# Patient Record
Sex: Female | Born: 1950 | Race: White | Hispanic: No | Marital: Married | Smoking: Never smoker
Health system: Southern US, Community
[De-identification: ages and names within clinical notes are randomized; demographics above are authoritative.]

## PROBLEM LIST (undated history)

## (undated) DIAGNOSIS — F1910 Other psychoactive substance abuse, uncomplicated: Secondary | ICD-10-CM

## (undated) DIAGNOSIS — M810 Age-related osteoporosis without current pathological fracture: Secondary | ICD-10-CM

## (undated) DIAGNOSIS — K219 Gastro-esophageal reflux disease without esophagitis: Secondary | ICD-10-CM

## (undated) DIAGNOSIS — Z6841 Body Mass Index (BMI) 40.0 and over, adult: Secondary | ICD-10-CM

## (undated) DIAGNOSIS — E785 Hyperlipidemia, unspecified: Secondary | ICD-10-CM

## (undated) DIAGNOSIS — D649 Anemia, unspecified: Secondary | ICD-10-CM

## (undated) DIAGNOSIS — T7840XA Allergy, unspecified, initial encounter: Secondary | ICD-10-CM

## (undated) DIAGNOSIS — M1990 Unspecified osteoarthritis, unspecified site: Secondary | ICD-10-CM

## (undated) DIAGNOSIS — J189 Pneumonia, unspecified organism: Secondary | ICD-10-CM

## (undated) DIAGNOSIS — E119 Type 2 diabetes mellitus without complications: Secondary | ICD-10-CM

## (undated) DIAGNOSIS — I10 Essential (primary) hypertension: Secondary | ICD-10-CM

## (undated) DIAGNOSIS — Z5189 Encounter for other specified aftercare: Secondary | ICD-10-CM

## (undated) DIAGNOSIS — F32A Depression, unspecified: Secondary | ICD-10-CM

## (undated) DIAGNOSIS — F419 Anxiety disorder, unspecified: Secondary | ICD-10-CM

## (undated) HISTORY — DX: Age-related osteoporosis without current pathological fracture: M81.0

## (undated) HISTORY — DX: Gastro-esophageal reflux disease without esophagitis: K21.9

## (undated) HISTORY — DX: Other psychoactive substance abuse, uncomplicated: F19.10

## (undated) HISTORY — DX: Allergy, unspecified, initial encounter: T78.40XA

## (undated) HISTORY — PX: UPPER GASTROINTESTINAL ENDOSCOPY: SHX188

## (undated) HISTORY — PX: COLONOSCOPY: SHX174

## (undated) HISTORY — PX: ABDOMINAL HYSTERECTOMY: SHX81

## (undated) HISTORY — DX: Hyperlipidemia, unspecified: E78.5

## (undated) HISTORY — DX: Encounter for other specified aftercare: Z51.89

## (undated) HISTORY — DX: Essential (primary) hypertension: I10

## (undated) HISTORY — PX: WISDOM TOOTH EXTRACTION: SHX21

## (undated) HISTORY — PX: OOPHORECTOMY: SHX86

## (undated) HISTORY — DX: Type 2 diabetes mellitus without complications: E11.9

## (undated) HISTORY — DX: Anxiety disorder, unspecified: F41.9

## (undated) HISTORY — DX: Depression, unspecified: F32.A

## (undated) HISTORY — PX: CERVIX REMOVAL: SHX592

---

## 2007-05-24 ENCOUNTER — Emergency Department (HOSPITAL_COMMUNITY): Admission: EM | Admit: 2007-05-24 | Discharge: 2007-05-24 | Payer: Self-pay | Admitting: Emergency Medicine

## 2016-01-17 DIAGNOSIS — R911 Solitary pulmonary nodule: Secondary | ICD-10-CM

## 2016-01-17 DIAGNOSIS — R0602 Shortness of breath: Secondary | ICD-10-CM

## 2016-01-17 DIAGNOSIS — R6884 Jaw pain: Secondary | ICD-10-CM | POA: Diagnosis not present

## 2016-01-17 DIAGNOSIS — M542 Cervicalgia: Secondary | ICD-10-CM | POA: Diagnosis not present

## 2016-01-18 DIAGNOSIS — R0602 Shortness of breath: Secondary | ICD-10-CM | POA: Diagnosis not present

## 2016-01-18 DIAGNOSIS — R6884 Jaw pain: Secondary | ICD-10-CM | POA: Diagnosis not present

## 2016-01-18 DIAGNOSIS — M542 Cervicalgia: Secondary | ICD-10-CM | POA: Diagnosis not present

## 2017-10-24 ENCOUNTER — Encounter (HOSPITAL_BASED_OUTPATIENT_CLINIC_OR_DEPARTMENT_OTHER): Payer: Self-pay | Admitting: *Deleted

## 2017-10-24 ENCOUNTER — Other Ambulatory Visit: Payer: Self-pay

## 2017-10-24 ENCOUNTER — Emergency Department (HOSPITAL_BASED_OUTPATIENT_CLINIC_OR_DEPARTMENT_OTHER): Payer: Medicare Other

## 2017-10-24 ENCOUNTER — Observation Stay (HOSPITAL_BASED_OUTPATIENT_CLINIC_OR_DEPARTMENT_OTHER)
Admission: EM | Admit: 2017-10-24 | Discharge: 2017-10-25 | Disposition: A | Discharge status: Home / Self Care | Payer: Medicare Other | Attending: Internal Medicine | Admitting: Internal Medicine

## 2017-10-24 DIAGNOSIS — Z8249 Family history of ischemic heart disease and other diseases of the circulatory system: Secondary | ICD-10-CM | POA: Diagnosis not present

## 2017-10-24 DIAGNOSIS — R0981 Nasal congestion: Secondary | ICD-10-CM | POA: Diagnosis present

## 2017-10-24 DIAGNOSIS — H5712 Ocular pain, left eye: Secondary | ICD-10-CM | POA: Diagnosis not present

## 2017-10-24 DIAGNOSIS — I10 Essential (primary) hypertension: Secondary | ICD-10-CM | POA: Insufficient documentation

## 2017-10-24 DIAGNOSIS — R0600 Dyspnea, unspecified: Secondary | ICD-10-CM | POA: Diagnosis not present

## 2017-10-24 DIAGNOSIS — J3489 Other specified disorders of nose and nasal sinuses: Secondary | ICD-10-CM | POA: Diagnosis not present

## 2017-10-24 DIAGNOSIS — Z6841 Body Mass Index (BMI) 40.0 and over, adult: Secondary | ICD-10-CM | POA: Diagnosis not present

## 2017-10-24 DIAGNOSIS — Z7722 Contact with and (suspected) exposure to environmental tobacco smoke (acute) (chronic): Secondary | ICD-10-CM | POA: Diagnosis not present

## 2017-10-24 DIAGNOSIS — R0789 Other chest pain: Principal | ICD-10-CM | POA: Insufficient documentation

## 2017-10-24 DIAGNOSIS — R079 Chest pain, unspecified: Secondary | ICD-10-CM | POA: Diagnosis present

## 2017-10-24 DIAGNOSIS — E7800 Pure hypercholesterolemia, unspecified: Secondary | ICD-10-CM

## 2017-10-24 HISTORY — DX: Body Mass Index (BMI) 40.0 and over, adult: Z684

## 2017-10-24 HISTORY — DX: Morbid (severe) obesity due to excess calories: E66.01

## 2017-10-24 LAB — BASIC METABOLIC PANEL
ANION GAP: 10 (ref 5–15)
BUN: 19 mg/dL (ref 8–23)
CHLORIDE: 98 mmol/L (ref 98–111)
CO2: 31 mmol/L (ref 22–32)
Calcium: 8.8 mg/dL — ABNORMAL LOW (ref 8.9–10.3)
Creatinine, Ser: 0.62 mg/dL (ref 0.44–1.00)
GFR calc non Af Amer: >60 mL/min (ref >60)
Glucose, Bld: 121 mg/dL — ABNORMAL HIGH (ref 70–99)
POTASSIUM: 4.2 mmol/L (ref 3.5–5.1)
SODIUM: 139 mmol/L (ref 135–145)

## 2017-10-24 LAB — D-DIMER, QUANTITATIVE (NOT AT ARMC): D DIMER QUANT: 0.35 ug{FEU}/mL (ref 0.00–0.50)

## 2017-10-24 LAB — TROPONIN I
Troponin I: <0.03 ng/mL (ref <0.03)
Troponin I: <0.03 ng/mL (ref <0.03)

## 2017-10-24 LAB — CBC
HCT: 45.6 % (ref 36.0–46.0)
HEMOGLOBIN: 14.2 g/dL (ref 12.0–15.0)
MCH: 27 pg (ref 26.0–34.0)
MCHC: 31.1 g/dL (ref 30.0–36.0)
MCV: 86.9 fL (ref 80.0–100.0)
PLATELETS: 283 10*3/uL (ref 150–400)
RBC: 5.25 MIL/uL — AB (ref 3.87–5.11)
RDW: 14.7 % (ref 11.5–15.5)
WBC: 9 10*3/uL (ref 4.0–10.5)
nRBC: 0 % (ref 0.0–0.2)

## 2017-10-24 LAB — BRAIN NATRIURETIC PEPTIDE: B NATRIURETIC PEPTIDE 5: 14.8 pg/mL (ref 0.0–100.0)

## 2017-10-24 MED ORDER — IPRATROPIUM-ALBUTEROL 0.5-2.5 (3) MG/3ML IN SOLN: 3.0000 mL | Freq: Four times a day (QID) | RESPIRATORY_TRACT | Status: DC | PRN

## 2017-10-24 MED ORDER — HYDRALAZINE HCL 20 MG/ML IJ SOLN: 10.0000 mg | INTRAMUSCULAR | Status: DC | PRN

## 2017-10-24 MED ORDER — ACETAMINOPHEN 325 MG PO TABS: 650.0000 mg | ORAL_TABLET | ORAL | Status: DC | PRN

## 2017-10-24 MED ORDER — ENOXAPARIN SODIUM 40 MG/0.4ML ~~LOC~~ SOLN: 40.0000 mg | SUBCUTANEOUS | Status: DC

## 2017-10-24 MED ORDER — ASPIRIN 81 MG PO CHEW
324.0000 mg | CHEWABLE_TABLET | Freq: Once | ORAL | Status: AC
  Administered 2017-10-24: 324 mg via ORAL
  Filled 2017-10-24: qty 4

## 2017-10-24 MED ORDER — ONDANSETRON HCL 4 MG/2ML IJ SOLN: 4.0000 mg | Freq: Four times a day (QID) | INTRAMUSCULAR | Status: DC | PRN

## 2017-10-24 MED ORDER — LORATADINE 10 MG PO TABS
10.0000 mg | ORAL_TABLET | Freq: Every day | ORAL | Status: DC
  Administered 2017-10-25 (×2): 10 mg via ORAL
  Filled 2017-10-24 (×2): qty 1

## 2017-10-24 MED ORDER — GI COCKTAIL ~~LOC~~: 30.0000 mL | Freq: Four times a day (QID) | ORAL | Status: DC | PRN

## 2017-10-24 MED ORDER — MORPHINE SULFATE (PF) 2 MG/ML IV SOLN: 2.0000 mg | INTRAVENOUS | Status: DC | PRN

## 2017-10-24 MED ORDER — GUAIFENESIN ER 600 MG PO TB12
600.0000 mg | ORAL_TABLET | Freq: Two times a day (BID) | ORAL | Status: DC
  Administered 2017-10-25: 600 mg via ORAL
  Filled 2017-10-24: qty 1

## 2017-10-24 NOTE — Progress Notes (Signed)
67 year old female with no significant past medical history being transferred from [facility] to be placed in observation for chest pain, rule out MI.  Work-up negative including a negative EKG, chest x-ray, troponins, CBC and BMP.  The chest pain was associated with shortness of breath and nausea for the last several days.

## 2017-10-24 NOTE — ED Triage Notes (Addendum)
Chest pain x 2 days. Facial pain. SOB on the way here. She drove herself here.

## 2017-10-24 NOTE — ED Notes (Signed)
As EMT was finished with EKG, pateint stated to another nurse. Im feeling better so I hope I can leave soon.

## 2017-10-24 NOTE — ED Notes (Addendum)
C/o chest pressure x 2 days  Worse w palpation  Denies n/v  No radiation  Sob x 1 day  Talking complete sentences  Cough, congestion

## 2017-10-24 NOTE — ED Provider Notes (Signed)
Medical screening examination/treatment/procedure(s) were conducted as a shared visit with non-physician practitioner(s) and myself.  I personally evaluated the patient during the encounter.  EKG Interpretation  Date/Time:  Saturday October 24 2017 13:46:24 EDT Ventricular Rate:  82 PR Interval:    QRS Duration: 81 QT Interval:  354 QTC Calculation: 414 R Axis:   4 Text Interpretation:  Sinus rhythm Low voltage, precordial leads normal. no old comparison Confirmed by Charlesetta Shanks [phone]) on 10/24/2017 5:03:09 PM Has been getting waxing and waning left-sided chest pressure for the past 2 days.  She reports today she had an episode where she quite suddenly felt very poorly with shortness of breath and nausea that came on while driving.  She reports recently her blood pressure has been elevated.  That is been atypical for her in the past.  She was not routinely on blood pressure medications.  Patient is alert and appropriate.  Heart is regular no gross rub murmur gallop.  Lungs are clear no gross wheeze rhonchi or rale.  Symptoms concerning for potential anginal equivalent.  Patient has had aspirin.  She does not have active chest pain at this time.  Plan for admission.   Charlesetta Shanks, MD 10/24/17 1725

## 2017-10-24 NOTE — ED Notes (Signed)
ED Provider at bedside. 

## 2017-10-24 NOTE — ED Notes (Signed)
EMT will attempt EKG once PA is done with assessment

## 2017-10-24 NOTE — H&P (Addendum)
History and Physical    Amber Huffman LXB:[id number] DOB: 1950/04/26 DOA: 10/24/2017  Referring MD/NP/PA: Merton Border, MD PCP: System, Pcp Not In  Patient coming from: [facility] transfer  Chief Complaint: Chest pressure  I have personally briefly reviewed patient's old medical records in [place]   HPI: Amber Huffman is a 66 y.o. female with medical history significant of morbid obesity; who presents with complaints of chest pressure and shortness of breath over the last 2-3 days.  She was evaluated by her primary care provider 4 days ago and noted to have gained 8 pounds since June and was found to have elevated blood pressure of 167/90.  Her blood pressure is normally very well controlled.  She reports having intermittent bouts of left-sided chest pressure that do not radiate.  2 days ago when symptoms occurred she took aspirin with resolution of symptoms.  Notes associated shortness of breath and some mild swelling of her shins.  Symptoms occurred usually at rest.  Denies having any diaphoresis, lightheadedness, nausea, vomiting, palpitations, recent travel, calf pain, history of blood clots, fever, chills, or chest pain symptoms previously.  Over the last day or so she has had left-sided sinus congestion, pressure, postnasal drainage, and nonproductive cough.  She took an old Lasix pill that she had because she felt that her blood pressure was elevated.  Yesterday when patient had chest pressure symptoms she took ibuprofen with improvement in symptoms.  However, when symptoms occurred again today she went to the emergency department for further evaluation.  The patient denies smoking but reports secondhand exposure from her sister who smokes.  ED Course: Upon admission into the emergency department patient was seen to be afebrile, pulse 75-111, respirations 13-24, blood pressures elevated up to 175/100, and O2 saturation maintained on room air.  With ambulation O2 sats dropped once to 84%, but  came back to 96% with deep breaths.  Work-up so far negative including  EKG, chest x-ray, troponins, CBC, and BMP.  Was given 324 mg of aspirin to chew.  TRH called to admit and accepted to a telemetry bed for observation overnight.  Review of Systems  Constitutional: Positive for malaise/fatigue. Negative for fever.  HENT: Positive for congestion.   Eyes: Negative for photophobia and pain.  Respiratory: Positive for cough and shortness of breath. Negative for sputum production and wheezing.   Cardiovascular: Positive for chest pain and leg swelling.  Gastrointestinal: Negative for abdominal pain, nausea and vomiting.  Genitourinary: Negative for dysuria and frequency.  Musculoskeletal: Positive for joint pain. Negative for falls.  Neurological: Positive for headaches. Negative for loss of consciousness.  Endo/Heme/Allergies: Positive for environmental allergies. Does not bruise/bleed easily.  Psychiatric/Behavioral: Negative for memory loss and substance abuse.    Past Medical History:  Diagnosis Date  . Morbid obesity with BMI of 50.0-59.9, adult [facility])     Past Surgical History:  Procedure Laterality Date  . ABDOMINAL HYSTERECTOMY       reports that she is a non-smoker but has been exposed to tobacco smoke. She has never used smokeless tobacco. She reports that she drank alcohol. She reports that she does not use drugs.  Allergies  Allergen Reactions  . Penicillins     rash    History reviewed. No pertinent family history.  Prior to Admission medications   Not on File    Physical Exam:  Constitutional: Obese female in NAD, calm, comfortable Vitals:   10/24/17 1814 10/24/17 1831 10/24/17 1833 10/24/17 1955  BP:  Marland Kitchen)  158/66  (!) 152/68  Pulse: 78  86   Resp: 13  17 15   Temp:    98.6 F (37 C)  TempSrc:    Oral  SpO2: 95%  97% 98%  Weight:      Height:       Eyes: PERRL, lids and conjunctivae normal ENMT: Mucous membranes are moist. Posterior pharynx with mucous  drainage present. Neck: normal, supple, no masses, no thyromegaly Respiratory: clear to auscultation bilaterally, no wheezing, no crackles. Normal respiratory effort. No accessory muscle use.  Cardiovascular: Regular rate and rhythm, no murmurs / rubs / gallops.  Trace lower extremity  edema. 2+ pedal pulses. No carotid bruits.  Some mild point tenderness to palpation of the left substernal chest wall, but reports not similar Abdomen: no tenderness, no masses palpated. No hepatosplenomegaly. Bowel sounds positive.  Musculoskeletal: no clubbing / cyanosis. No joint deformity upper and lower extremities. Good ROM, no contractures. Normal muscle tone.  Skin: no rashes, lesions, ulcers. No induration Neurologic: CN 2-12 grossly intact. Sensation intact, DTR normal. Strength 5/5 in all 4.  Psychiatric: Normal judgment and insight. Alert and oriented x 3. Normal mood.     Labs on Admission: I have personally reviewed following labs and imaging studies  CBC: Recent Labs  Lab 10/24/17 1354  WBC 9.0  HGB 14.2  HCT 45.6  MCV 86.9  PLT 027   Basic Metabolic Panel: Recent Labs  Lab 10/24/17 1354  NA 139  K 4.2  CL 98  CO2 31  GLUCOSE 121*  BUN 19  CREATININE 0.62  CALCIUM 8.8*   GFR: Estimated Creatinine Clearance: 74.4 mL/min (by C-G formula based on SCr of 0.62 mg/dL). Liver Function Tests: No results for input(s): AST, ALT, ALKPHOS, BILITOT, PROT, ALBUMIN in the last 168 hours. No results for input(s): LIPASE, AMYLASE in the last 168 hours. No results for input(s): AMMONIA in the last 168 hours. Coagulation Profile: No results for input(s): INR, PROTIME in the last 168 hours. Cardiac Enzymes: Recent Labs  Lab 10/24/17 1354 10/24/17 1702  TROPONINI <0.03 <0.03   BNP (last 3 results) No results for input(s): PROBNP in the last 8760 hours. HbA1C: No results for input(s): HGBA1C in the last 72 hours. CBG: No results for input(s): GLUCAP in the last 168 hours. Lipid  Profile: No results for input(s): CHOL, HDL, LDLCALC, TRIG, CHOLHDL, LDLDIRECT in the last 72 hours. Thyroid Function Tests: No results for input(s): TSH, T4TOTAL, FREET4, T3FREE, THYROIDAB in the last 72 hours. Anemia Panel: No results for input(s): VITAMINB12, FOLATE, FERRITIN, TIBC, IRON, RETICCTPCT in the last 72 hours. Urine analysis: No results found for: COLORURINE, APPEARANCEUR, LABSPEC, PHURINE, GLUCOSEU, HGBUR, BILIRUBINUR, KETONESUR, PROTEINUR, UROBILINOGEN, NITRITE, LEUKOCYTESUR Sepsis Labs: No results found for this or any previous visit (from the past 240 hour(s)).   Radiological Exams on Admission: Dg Chest 2 View  Result Date: 10/24/2017 CLINICAL DATA:  Chest pain for the past 2 days. EXAM: CHEST - 2 VIEW COMPARISON:  Chest x-ray dated February 26, 2016. FINDINGS: The heart size and mediastinal contours are within normal limits. Normal pulmonary vascularity. Scattered pleuroparenchymal scarring is unchanged. No focal consolidation, pleural effusion, or pneumothorax. Unchanged eventration of the right hemidiaphragm. No acute osseous abnormality. IMPRESSION: No active cardiopulmonary disease. Electronically Signed   By: Titus Dubin M.D.   On: 10/24/2017 13:43    EKG: Independently reviewed.  Normal sinus rhythm at 82 bpm  Assessment/Plan Chest pain: Acute.  Patient presents with complaints of left-sided chest pressure that  have occurred intermittently and not associated with exertion.  EKG without any significant ischemic changes.  Initial troponins negative. Heart score is at least 4.  Risk factors include obesity, age, HTN, passive sg - Admit to a telemetry bed - Check d-dimer - Trend cardiac troponins overnight - Check lipid panel in a.m. - Order echocardiogram in a.m, if warranted - Will need to consult cardiology in a.m.   Dyspnea: Acute.  Patient reports having associated complaints of shortness of breath.  Chest x-ray otherwise noted to be clear. - D-dimer and  obtain CT angiogram of chest if found positive  Sinus congestion: Patient does appear to have sinus congestion with signs of postnasal drip on physical exam. - Symptomatic treatment with Claritin and Mucinex  Elevated blood pressure: Blood pressures noted to be as high as 178/100. -Will need to be follow-up with PCP and determine if patient needs to be started on blood pressure medication  Passive exposure to smoke: Patient lives with her sister who smokes.  Morbid obesity: BMI 51.77: Patient reports weight gain of 8 pounds over the last 3 months. - Check TSH  History of lung nodule: Patient had CT imaging in 09/30/2016 per review of records on care everywhere showing small sub-solid nodules previously seen had essentially resolved.  DVT prophylaxis: lovenox  Code Status: Full  Family Communication: no family present  Disposition Plan: Likely discharge home if work-up negative Consults called: None  Admission status: observation  Norval Morton MD [facility] Pager [phone]   If 7PM-7AM, please contact night-coverage [email or web address] Password [facility]  10/24/2017, 8:46 PM

## 2017-10-24 NOTE — ED Notes (Signed)
Ambulated to restroom with pulse ox. SAT dropped to 84% once, and came right back up to 96% with some good deep breaths. Back on pulse ox in room = 98% on RA

## 2017-10-24 NOTE — ED Provider Notes (Signed)
[place] EMERGENCY DEPARTMENT Provider Note   CSN: [id number] Arrival date & time: 10/24/17  1301     History   Chief Complaint Chief Complaint  Patient presents with  . Chest Pain    HPI Amber Huffman is a 67 y.o. female.  HPI   Patient is a 67 year old female who presents the emergency department complaining of chest pain and shortness of breath.  Patient states that she has had left-sided chest pain for the last 3 days.  Describes the pain as a pressure that she rates at 4-5/10.  Pain is nonradiating.  It is worse with palpation.  Pain is constant.  It is not associated with exertion, diaphoresis, lightheadedness or dizziness.  Pain is associated with shortness of breath that she noted today.  States she has chronic DOE for the last several months but today had SOB and nausea while driving in the car. Reports sxs improving after arriving to the ED. No pain with inspiration.  Also notes a nonproductive cough and nasal congestion for the last 2 days.  Also reports pressure to the left side of her head beginning she describes as mild.  She took aspirin and ibuprofen yesterday which relieved her symptoms.  No numbness or weakness to the arms or legs. No vision changes, lightheadedness, dizziness, or eye pain.  Was seen by pcp earlier this week for her physical and was noted to have increased BP. She was not started on medications. No tobacco use.  Denies leg pain/swelling, hemoptysis, recent surgery/trauma, recent long travel, hormone use, personal hx of cancer, or hx of DVT/PE.    History reviewed. No pertinent past medical history.  Patient Active Problem List   Diagnosis Date Noted  . Chest pain 10/24/2017    Past Surgical History:  Procedure Laterality Date  . ABDOMINAL HYSTERECTOMY       OB History   None      Home Medications    Prior to Admission medications   Not on File    Family History No family history on file.  Social History Social  History   Tobacco Use  . Smoking status: Passive Smoke Exposure - Never Smoker  . Smokeless tobacco: Never Used  Substance Use Topics  . Alcohol use: Not on file  . Drug use: Never     Allergies   Penicillins   Review of Systems Review of Systems  Constitutional: Negative for chills and fever.  HENT: Positive for congestion and rhinorrhea. Negative for sore throat.   Eyes: Negative for visual disturbance.  Respiratory: Positive for cough and shortness of breath (improving).   Cardiovascular: Positive for chest pain. Negative for leg swelling.  Gastrointestinal: Positive for nausea. Negative for abdominal pain, constipation, diarrhea and vomiting.  Genitourinary: Negative for dysuria.  Musculoskeletal: Negative for back pain.  Skin: Negative for rash.  Neurological: Negative for headaches.    Physical Exam Updated Vital Signs BP (!) 128/56   Pulse 80   Temp 98.6 F (37 C) (Oral)   Resp 13   Ht 4' 10.5" (1.486 m)   Wt 114.3 kg   SpO2 97%   BMI 51.77 kg/m   Physical Exam  Constitutional: She appears well-developed and well-nourished. She does not appear ill. No distress.  HENT:  Head: Normocephalic and atraumatic.  Eyes: Conjunctivae are normal.  Neck: Neck supple.  Cardiovascular: Normal rate and regular rhythm.  No murmur heard. Left chest wall TTP, reproduces pain. No rashes to suggest shingles.  Pulmonary/Chest: Effort normal and  breath sounds normal. No respiratory distress. She has no decreased breath sounds. She has no wheezes. She has no rhonchi. She has no rales.  Dry cough present  Abdominal: Soft. Bowel sounds are normal. She exhibits no distension. There is no tenderness.  Musculoskeletal: She exhibits no edema.  Trace pitting edema. No calf TTP or erythema  Neurological: She is alert.  Skin: Skin is warm and dry.  Psychiatric: She has a normal mood and affect.  Nursing note and vitals reviewed.   ED Treatments / Results  Labs (all labs ordered  are listed, but only abnormal results are displayed) Labs Reviewed  BASIC METABOLIC PANEL - Abnormal; Notable for the following components:      Result Value   Glucose, Bld 121 (*)    Calcium 8.8 (*)    All other components within normal limits  CBC - Abnormal; Notable for the following components:   RBC 5.25 (*)    All other components within normal limits  TROPONIN I  BRAIN NATRIURETIC PEPTIDE  TROPONIN I    EKG EKG Interpretation  Date/Time:  Saturday October 24 2017 13:46:24 EDT Ventricular Rate:  82 PR Interval:    QRS Duration: 81 QT Interval:  354 QTC Calculation: 414 R Axis:   4 Text Interpretation:  Sinus rhythm Low voltage, precordial leads normal. no old comparison Confirmed by Charlesetta Shanks [phone]) on 10/24/2017 5:03:09 PM   Radiology Dg Chest 2 View  Result Date: 10/24/2017 CLINICAL DATA:  Chest pain for the past 2 days. EXAM: CHEST - 2 VIEW COMPARISON:  Chest x-ray dated February 26, 2016. FINDINGS: The heart size and mediastinal contours are within normal limits. Normal pulmonary vascularity. Scattered pleuroparenchymal scarring is unchanged. No focal consolidation, pleural effusion, or pneumothorax. Unchanged eventration of the right hemidiaphragm. No acute osseous abnormality. IMPRESSION: No active cardiopulmonary disease. Electronically Signed   By: Titus Dubin M.D.   On: 10/24/2017 13:43    Procedures Procedures (including critical care time)  Medications Ordered in ED Medications  aspirin chewable tablet 324 mg (324 mg Oral Given 10/24/17 1452)     Initial Impression / Assessment and Plan / ED Course  I have reviewed the triage vital signs and the nursing notes.  Pertinent labs & imaging results that were available during my care of the patient were reviewed by me and considered in my medical decision making (see chart for details).   Nursing staff ambulated patient and noted that sats dropped to 84% on monitor with ambulation but immediately  went back up above 96% on room air.  Pulse oximetry that was used during this ambulation did not show waveform.  Re-evaluated pt. I personally ambulated her with pulse oximetry monitoring and patient maintain sats from 96 to 98% on room air with good waveform. She did become tachycardic with ambulation. Pt states her CP has resolved and she is no longer short of breath. She is eager to be discharged but is agreeable to stay for delta troponin.  Discussed pt presentation and exam findings with Dr. Johnney Killian, who evaluated the pt and recommends admitting the patient.  5:58 PM CONSULT with Dr. Samara Deist, hospitalist service, who accepts the pt for admission at [facility].   Final Clinical Impressions(s) / ED Diagnoses   Final diagnoses:  Chest pain, unspecified type   Pt presenting to to the ED today c/o left sided chest pain and shortness of breath. Has chronic dyspnea on exertion that she feels is worse today and also associated with nausea.  Initially HR documented at 111, has improved and remained normal during her stay. Initial BP was also elevated, but normalized throughout her stay without intervention. She was given 325 mg ASA. EKG with NSR and no ischemic changes. Delta trop negative. BNP negative. CBC without anemia or leukocytosis. BMP reassuring. CXR without pneumonia, pneumothorax, or or widened mediastinum.  Heart score 4, and pt high risk for ACS. Have lower suspicion for PE or other cardiopulmonary etiology of sxs. Will plan for admission for chest pain workup.  Pt admitted to [facility].  ED Discharge Orders    None       Rodney Booze, [place] 10/24/17 Johnnye Lana    Charlesetta Shanks, MD 10/24/17 1843

## 2017-10-25 DIAGNOSIS — R079 Chest pain, unspecified: Secondary | ICD-10-CM | POA: Diagnosis not present

## 2017-10-25 DIAGNOSIS — R0789 Other chest pain: Secondary | ICD-10-CM | POA: Diagnosis not present

## 2017-10-25 DIAGNOSIS — I10 Essential (primary) hypertension: Secondary | ICD-10-CM

## 2017-10-25 DIAGNOSIS — E7800 Pure hypercholesterolemia, unspecified: Secondary | ICD-10-CM | POA: Diagnosis not present

## 2017-10-25 LAB — TROPONIN I: Troponin I: <0.03 ng/mL (ref <0.03)

## 2017-10-25 LAB — CBC WITH DIFFERENTIAL/PLATELET
ABS IMMATURE GRANULOCYTES: 0.03 10*3/uL (ref 0.00–0.07)
BASOS ABS: 0 10*3/uL (ref 0.0–0.1)
Basophils Relative: 1 %
Eosinophils Absolute: 0.6 10*3/uL — ABNORMAL HIGH (ref 0.0–0.5)
Eosinophils Relative: 7 %
HCT: 48.4 % — ABNORMAL HIGH (ref 36.0–46.0)
HEMOGLOBIN: 14.4 g/dL (ref 12.0–15.0)
Immature Granulocytes: 0 %
Lymphocytes Relative: 26 %
Lymphs Abs: 2.2 10*3/uL (ref 0.7–4.0)
MCH: 25.7 pg — ABNORMAL LOW (ref 26.0–34.0)
MCHC: 29.8 g/dL — ABNORMAL LOW (ref 30.0–36.0)
MCV: 86.3 fL (ref 80.0–100.0)
MONO ABS: 0.9 10*3/uL (ref 0.1–1.0)
MONOS PCT: 10 %
NEUTROS ABS: 4.9 10*3/uL (ref 1.7–7.7)
Neutrophils Relative %: 56 %
Platelets: 319 10*3/uL (ref 150–400)
RBC: 5.61 MIL/uL — ABNORMAL HIGH (ref 3.87–5.11)
RDW: 14.8 % (ref 11.5–15.5)
WBC: 8.6 10*3/uL (ref 4.0–10.5)
nRBC: 0 % (ref 0.0–0.2)

## 2017-10-25 LAB — BASIC METABOLIC PANEL
Anion gap: 10 (ref 5–15)
BUN: 11 mg/dL (ref 8–23)
CHLORIDE: 101 mmol/L (ref 98–111)
CO2: 28 mmol/L (ref 22–32)
Calcium: 8.7 mg/dL — ABNORMAL LOW (ref 8.9–10.3)
Creatinine, Ser: 0.66 mg/dL (ref 0.44–1.00)
GFR calc non Af Amer: >60 mL/min (ref >60)
Glucose, Bld: 114 mg/dL — ABNORMAL HIGH (ref 70–99)
POTASSIUM: 4.1 mmol/L (ref 3.5–5.1)
SODIUM: 139 mmol/L (ref 135–145)

## 2017-10-25 LAB — GLUCOSE, CAPILLARY: GLUCOSE-CAPILLARY: 137 mg/dL — AB (ref 70–99)

## 2017-10-25 LAB — TSH: TSH: 1.772 u[IU]/mL (ref 0.350–4.500)

## 2017-10-25 LAB — LIPID PANEL
CHOL/HDL RATIO: 5.1 ratio
Cholesterol: 204 mg/dL — ABNORMAL HIGH (ref 0–200)
HDL: 40 mg/dL — AB (ref >40)
LDL CALC: 142 mg/dL — AB (ref 0–99)
Triglycerides: 109 mg/dL (ref <150)
VLDL: 22 mg/dL (ref 0–40)

## 2017-10-25 MED ORDER — HYDROCHLOROTHIAZIDE 12.5 MG PO CAPS: 12.5000 mg | ORAL_CAPSULE | Freq: Every day | ORAL | 1 refills | Status: DC

## 2017-10-25 MED ORDER — HYDROCHLOROTHIAZIDE 12.5 MG PO CAPS
12.5000 mg | ORAL_CAPSULE | Freq: Every day | ORAL | Status: DC
  Administered 2017-10-25: 12.5 mg via ORAL
  Filled 2017-10-25: qty 1

## 2017-10-25 MED ORDER — SALINE SPRAY 0.65 % NA SOLN
1.0000 | NASAL | Status: DC | PRN
  Administered 2017-10-25: 1 via NASAL
  Filled 2017-10-25: qty 44

## 2017-10-25 NOTE — Consult Note (Signed)
Cardiology Consultation:   Patient ID: Amber Huffman MRN: [id number]; DOB: Apr 26, 1950  Admit date: 10/24/2017 Date of Consult: 10/25/2017  Primary Care Provider: System, Pcp Not In Primary Cardiologist: New to [facility]   Patient Profile:   Amber Huffman is a 67 y.o. female with a hx of morbid obesity and lung nodules (seen at [facility] and now discharge) who is being seen today for the evaluation of chest pressure at the request of Dr. Eliseo Squires.  No prior history of CAD or any cardiac issue.  History of Present Illness:   Amber Huffman resented with intermittent history of chest pressure and shortness of breath for the past 3 days.  Patient retired earlier this year and since then living sedentary lifestyle.  Watching TV and eating double portion meal.  Seen by PCP about a week ago and noted 8 pound weight gain and hypertensive at 167/90. Felt most likely due to excessive eating.  She had an episode of chest pressure Friday night while watching TV.  Resolved with chewing aspirin.  Yesterday she had shortness of breath with chest pressure while driving leading to presentation at [facility] She was noted hypertensive at 175/100.  Patient reports chronic dyspnea on exertion due to morbid obesity and sedentary lifestyle.  No prior episode of chest pressure.  She has intermittent orthopnea and lower extremity edema.  For the past few days she also has cough, congestion, sinus pressure especially behind left eye.  She denies palpitation, dizziness or syncope.  Patient had left eye pain overnight which improved with Claritin.  No recurrent chest pain since admit.  Admitted and ruled out.  Troponin negative x5.  BNP normal.  Electrolyte, serum creatinine and hemoglobin within normal range.  LDL 142.  D-dimer normal.  TSH normal.  Chest x-ray without acute abnormality.  EKG shows sinus rhythm at rate of 82 bpm, no acute ischemic changes-personally reviewed.  Patient denies use of illicit drug or  tobacco abuse.  Father and mother had CAD in their 11s.  Thinks her blood pressure cause her to have these symptoms.  Past Medical History:  Diagnosis Date  . Morbid obesity with BMI of 50.0-59.9, adult [facility])     Past Surgical History:  Procedure Laterality Date  . ABDOMINAL HYSTERECTOMY       Inpatient Medications: Scheduled Meds: . enoxaparin (LOVENOX) injection  40 mg Subcutaneous Q24H  . guaiFENesin  600 mg Oral BID  . loratadine  10 mg Oral Daily   Continuous Infusions:  PRN Meds: acetaminophen, gi cocktail, hydrALAZINE, ipratropium-albuterol, morphine injection, ondansetron (ZOFRAN) IV  Allergies:    Allergies  Allergen Reactions  . Penicillins Rash    Has patient had a PCN reaction causing immediate rash, facial/tongue/throat swelling, SOB or lightheadedness with hypotension: Yes Has patient had a PCN reaction causing severe rash involving mucus membranes or skin necrosis: No Has patient had a PCN reaction that required hospitalization: No Has patient had a PCN reaction occurring within the last 10 years: No If all of the above answers are "NO", then may proceed with Cephalosporin use.    Social History:   Social History   Socioeconomic History  . Marital status: Married    Spouse name: Not on file  . Number of children: Not on file  . Years of education: Not on file  . Highest education level: Not on file  Occupational History  . Not on file  Social Needs  . Financial resource strain: Not hard at all  . Food  insecurity:    Worry: Never true    Inability: Never true  . Transportation needs:    Medical: No    Non-medical: No  Tobacco Use  . Smoking status: Passive Smoke Exposure - Never Smoker  . Smokeless tobacco: Never Used  Substance and Sexual Activity  . 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  . Drug use: Never  . Sexual  activity: Not on file  Lifestyle  . Physical activity:    Days per week: 0 days    Minutes per session: 0 min  . Stress: Not at all  Relationships  . Social connections:    Talks on phone: More than three times a week    Gets together: More than three times a week    Attends religious service: 1 to 4 times per year    Active member of club or organization: No    Attends meetings of clubs or organizations: Never    Relationship status: Married  . Intimate partner violence:    Fear of current or ex partner: No    Emotionally abused: No    Physically abused: No    Forced sexual activity: No  Other Topics Concern  . Not on file  Social History Narrative  . Not on file    Family History:   Family History  Problem Relation Age of Onset  . Heart disease Father        After the age of 66     ROS:  Please see the history of present illness.  All other ROS reviewed and negative.     Physical Exam/Data:   Vitals:   10/24/17 1831 10/24/17 1833 10/24/17 1955 10/25/17 0611  BP: (!) 158/66  (!) 152/68 (!) 153/78  Pulse:  86  80  Resp:  17 15 15   Temp:   98.6 F (37 C)   TempSrc:   Oral   SpO2:  97% 98% 98%  Weight:      Height:       No intake or output data in the 24 hours ending 10/25/17 0740 Filed Weights   10/24/17 1307  Weight: 114.3 kg   Body mass index is 51.77 kg/m.  General: Morbidly obese female in no acute distress HEENT: normal Lymph: no adenopathy Neck: no JVD Endocrine:  No thryomegaly Vascular: No carotid bruits; FA pulses 2+ bilaterally without bruits  Cardiac:  normal S1, S2; RRR; no murmur  Lungs:  clear to auscultation bilaterally, no wheezing, rhonchi or rales  Abd: soft, nontender, no hepatomegaly  Ext: Trace ankle edema Musculoskeletal:  No deformities, BUE and BLE strength normal and equal Skin: warm and dry  Neuro:  CNs 2-12 intact, no focal abnormalities noted Psych:  Normal affect   Telemetry:  Telemetry was personally reviewed and  demonstrates: Sinus rhythm at controlled ventricular rate  Relevant CV Studies: As summarized above  Laboratory Data:  Chemistry Recent Labs  Lab 10/24/17 1354 10/25/17 0308  NA 139 139  K 4.2 4.1  CL 98 101  CO2 31 28  GLUCOSE 121* 114*  BUN 19 11  CREATININE 0.62 0.66  CALCIUM 8.8* 8.7*  GFRNONAA >60 >60  GFRAA >60 >60  ANIONGAP 10 10    No results for input(s): PROT, ALBUMIN, AST, ALT, ALKPHOS, BILITOT in the last 168 hours. Hematology Recent Labs  Lab 10/24/17 1354 10/25/17 0308  WBC 9.0 8.6  RBC 5.25* 5.61*  HGB 14.2 14.4  HCT 45.6 48.4*  MCV 86.9 86.3  MCH 27.0 25.7*  MCHC 31.1 29.8*  RDW 14.7 14.8  PLT 283 319   Cardiac Enzymes Recent Labs  Lab 10/24/17 1354 10/24/17 1702 10/24/17 2118 10/25/17 0027 10/25/17 0308  TROPONINI <0.03 <0.03 <0.03 <0.03 <0.03   No results for input(s): TROPIPOC in the last 168 hours.  BNP Recent Labs  Lab 10/24/17 1354  BNP 14.8    DDimer  Recent Labs  Lab 10/24/17 2118  DDIMER 0.35    Radiology/Studies:  Dg Chest 2 View  Result Date: 10/24/2017 CLINICAL DATA:  Chest pain for the past 2 days. EXAM: CHEST - 2 VIEW COMPARISON:  Chest x-ray dated February 26, 2016. FINDINGS: The heart size and mediastinal contours are within normal limits. Normal pulmonary vascularity. Scattered pleuroparenchymal scarring is unchanged. No focal consolidation, pleural effusion, or pneumothorax. Unchanged eventration of the right hemidiaphragm. No acute osseous abnormality. IMPRESSION: No active cardiopulmonary disease. Electronically Signed   By: Titus Dubin M.D.   On: 10/24/2017 13:43    Assessment and Plan:   1. Chest pressure Patient ruled out.  Troponin negative x5.  EKG without acute ischemic changes.  BNP and d-dimer within normal range.  Differential includes untreated hypertension and possible bronchitis/sinus pressure.  Her main concern is pain behind left  eye.  She might have a component of diastolic dysfunction  given obesity. -Consider low-dose hydrochlorothiazide. Likely outpatient 2 days stress test. Will review final plan with MD.   2.  Left eye pain -Per primary team  3.  Intermittent elevated blood pressure -No history of hypertension.  4.  Morbid obesity -She has joined [employer] and plan to start next week.   For questions or updates, please contact [place] Please consult [email or web address] for contact info under     Jarrett Soho, PA  10/25/2017 7:40 AM

## 2017-10-25 NOTE — Discharge Summary (Addendum)
Physician Discharge Summary  Amber Huffman DGU:[id number] DOB: 1950-04-13 DOA: 10/24/2017  PCP: System, Pcp Not In  Admit date: 10/24/2017 Discharge date: 10/25/2017  Time spent: 45 minutes  Recommendations for Outpatient Follow-up:  1. Follow up with PCP next week as scheduled. Evaluate BP control. Recommend labs to trend potassium level and HCTZ started.  2. If patient continues to have chest pain once BP controlled consider OP stress test. 3. Needs weight loss/exercise   Discharge Diagnoses:  Principal Problem:   Chest pain Active Problems:   Morbid obesity ([place])   Passive smoke exposure   Sinus congestion   Essential hypertension   Pure hypercholesterolemia   Discharge Condition: stable and pain free  Diet recommendation: heart healthy  Filed Weights   10/24/17 1307  Weight: 114.3 kg    History of present illness:  Amber Huffman is a 67 y.o. female with medical history significant of morbid obesity; who presented on 10/12 with complaints of chest pressure and shortness of breath for 2-3 days.  She was evaluated by her primary care provider 4 days porior and noted to have gained 8 pounds since June and was found to have elevated blood pressure of 167/90.  She reported having intermittent bouts of left-sided chest pressure that do not radiate.  2 days prior when symptoms occurred she took aspirin with resolution of symptoms.  Noted associated shortness of breath and some mild swelling of her shins.  Symptoms occurred usually at rest.  Denied having any diaphoresis, lightheadedness, nausea, vomiting, palpitations, recent travel, calf pain, history of blood clots, fever, chills, or chest pain symptoms previously.  Over the previous day she  had left-sided sinus congestion, pressure, postnasal drainage, and nonproductive cough.  She took an old Lasix pill that she had because she felt that her blood pressure was elevated.  Day prior to admission when patient had chest pressure  symptoms she took ibuprofen with improvement in symptoms.  However, when symptoms recurred she went to the emergency department for further evaluation.  The patient denied smoking but reported secondhand exposure from her sister who smokes.  Hospital Course:  Chest pain/pressure: Acute.  Patient presents with complaints of left-sided chest pressure that have occurred intermittently and not associated with exertion.  EKG without any significant ischemic changes. Troponin negative x3. ekg unremarkable and chest xray without cardiopulmonary process. BNP within limits of normal.  Heart score is 4.  Risk factors include obesity, age, HTN, passive sg. Evaluated by cardiology who opine not likely ACS but likely related to HTN. Recommended HCTZ daily. Cardiology also recommended OP stress test if symptoms continue after BP controlled   Dyspnea: Resolved at discharge. Chest x-ray otherwise noted to be clear  Sinus congestion:  - Symptomatic treatment with Claritin and Mucinex and nasal spray -if does not clear- further work up  Elevated blood pressure: Blood pressures noted to be as high as 178/100. See #1. Follow up with PCP 1 week as scheduled  Passive exposure to smoke: Patient lives with her sister who smokes.  Morbid obesity: BMI 51.77: Patient reports weight gain of 8 pounds over the last 3 months. TSHwithin limits of normal   Procedures:    Consultations:  Dr Oval Linsey cardiology  Discharge Exam: Vitals:   10/24/17 1955 10/25/17 0611  BP: (!) 152/68 (!) 153/78  Pulse:  80  Resp: 15 15  Temp: 98.6 F (37 C)   SpO2: 98% 98%    General: well nourished sitting up in bed in no acute distress Cardiovascular: rrr  no mgr no LE edema Respiratory: normal effort BS clear bilaterally no wheeze  Discharge Instructions   Discharge Instructions    Call MD for:  difficulty breathing, headache or visual disturbances   Complete by:  As directed    Call MD for:  persistant dizziness  or light-headedness   Complete by:  As directed    Call MD for:  persistant nausea and vomiting   Complete by:  As directed    Diet - low sodium heart healthy   Complete by:  As directed    Discharge instructions   Complete by:  As directed    Follow up with PCP as scheduled next week. Evaluate BP control. Recommend lab work to trend potasium level.   Increase activity slowly   Complete by:  As directed      Allergies as of 10/25/2017      Reactions   Penicillins Rash   Has patient had a PCN reaction causing immediate rash, facial/tongue/throat swelling, SOB or lightheadedness with hypotension: Yes Has patient had a PCN reaction causing severe rash involving mucus membranes or skin necrosis: No Has patient had a PCN reaction that required hospitalization: No Has patient had a PCN reaction occurring within the last 10 years: No If all of the above answers are "NO", then may proceed with Cephalosporin use.      Medication List    STOP taking these medications   furosemide 20 MG tablet Commonly known as:  LASIX     TAKE these medications   hydrochlorothiazide 12.5 MG capsule Commonly known as:  MICROZIDE Take 1 capsule (12.5 mg total) by mouth daily.   ibuprofen 800 MG tablet Commonly known as:  ADVIL,MOTRIN Take 800 mg by mouth daily as needed (pain).   OVER THE COUNTER MEDICATION Apply 1 application topically daily as needed (rash). OTC anti-fungal medication - cream   Vitamin D (Ergocalciferol) 50000 units Caps capsule Commonly known as:  DRISDOL Take 50,000 Units by mouth every Thursday.      Allergies  Allergen Reactions  . Penicillins Rash    Has patient had a PCN reaction causing immediate rash, facial/tongue/throat swelling, SOB or lightheadedness with hypotension: Yes Has patient had a PCN reaction causing severe rash involving mucus membranes or skin necrosis: No Has patient had a PCN reaction that required hospitalization: No Has patient had a PCN  reaction occurring within the last 10 years: No If all of the above answers are "NO", then may proceed with Cephalosporin use.   Follow-up Information    Lendon Colonel, NP. Go on 11/03/2017.   Specialties:  Nurse Practitioner, Radiology, Cardiology Why:  @8 :30am for hospital cardiology follow up  Contact information: [address] [place] [address] [phone]        follow up with PCP Follow up.   Why:  if conservative measures do not help your sinus pain, may need further work up           The results of significant diagnostics from this hospitalization (including imaging, microbiology, ancillary and laboratory) are listed below for reference.    Significant Diagnostic Studies: Dg Chest 2 View  Result Date: 10/24/2017 CLINICAL DATA:  Chest pain for the past 2 days. EXAM: CHEST - 2 VIEW COMPARISON:  Chest x-ray dated February 26, 2016. FINDINGS: The heart size and mediastinal contours are within normal limits. Normal pulmonary vascularity. Scattered pleuroparenchymal scarring is unchanged. No focal consolidation, pleural effusion, or pneumothorax. Unchanged eventration of the right hemidiaphragm. No  acute osseous abnormality. IMPRESSION: No active cardiopulmonary disease. Electronically Signed   By: Titus Dubin M.D.   On: 10/24/2017 13:43    Microbiology: No results found for this or any previous visit (from the past 240 hour(s)).   Labs: Basic Metabolic Panel: Recent Labs  Lab 10/24/17 1354 10/25/17 0308  NA 139 139  K 4.2 4.1  CL 98 101  CO2 31 28  GLUCOSE 121* 114*  BUN 19 11  CREATININE 0.62 0.66  CALCIUM 8.8* 8.7*   Liver Function Tests: No results for input(s): AST, ALT, ALKPHOS, BILITOT, PROT, ALBUMIN in the last 168 hours. No results for input(s): LIPASE, AMYLASE in the last 168 hours. No results for input(s): AMMONIA in the last 168 hours. CBC: Recent Labs  Lab 10/24/17 1354 10/25/17 0308  WBC 9.0 8.6  NEUTROABS  --   4.9  HGB 14.2 14.4  HCT 45.6 48.4*  MCV 86.9 86.3  PLT 283 319   Cardiac Enzymes: Recent Labs  Lab 10/24/17 1354 10/24/17 1702 10/24/17 2118 10/25/17 0027 10/25/17 0308  TROPONINI <0.03 <0.03 <0.03 <0.03 <0.03   BNP: BNP (last 3 results) Recent Labs    10/24/17 1354  BNP 14.8    ProBNP (last 3 results) No results for input(s): PROBNP in the last 8760 hours.  CBG: Recent Labs  Lab 10/25/17 0809  GLUCAP 137*       Signed:  Eulogio Bear DO [facility] 10/25/2017, 12:26 PM

## 2017-10-25 NOTE — Discharge Instructions (Signed)
[facility] has 3 weight loss clinics: [facility] [place], [place] [address]  Get Directions Appointments [phone]    [facility] [place] Address  [place], [place] [address]  Get Directions Appointments [phone]    [facility] [place] Address  [address] [place],  [address]  Get Directions Contact us [phone]

## 2017-11-02 NOTE — Progress Notes (Signed)
Cardiology Office Note   Date:  11/04/2017   ID:  Amber Huffman, DOB [birth year], MRN [id number]  PCP:  System, Pcp Not In  Cardiologist:  Ryan  Chief Complaint  Patient presents with  . Obesity    Bp running high, hole in tooth  . Hospitalization Follow-up  . Hyperlipidemia     History of Present Illness: Amber Huffman is a 67 y.o. female who who is a retired Surveyor, mining, accompanied by her sister who is a retired ICU step down nurse, who presents for post hospitalization follow up after admission for chest pressure, with other history to include morbid obesity and lung nodules. She complained of dyspnea and chest pressure. She was also found to be hypertensive. She was ruled out for ACS. She was recommended to have HCTZ 12.5 mg daily, with planned 2 day OP stress test. She was advised on weight loss and to increase her activity as she was very sedentary.   She comes today with multiple complaints. She has had continued DOE, some chest pressure, frequent coughing, and chronic musculoskeletal pain. She has not yet been scheduled for her stress test or echo. Since leaving the hospital,she has been having tooth pain, which she feels may be causing her to feel badly but is concerned that this may be affecting her heart. She states that she is not a "medicine person." and would prefer not to have any more medications added to her regimen.   Past Medical History:  Diagnosis Date  . Morbid obesity with BMI of 50.0-59.9, adult [facility])     Past Surgical History:  Procedure Laterality Date  . ABDOMINAL HYSTERECTOMY       Current Outpatient Medications  Medication Sig Dispense Refill  . hydrochlorothiazide (MICROZIDE) 12.5 MG capsule Take 1 capsule (12.5 mg total) by mouth daily. 30 capsule 1  . ibuprofen (ADVIL,MOTRIN) 800 MG tablet Take 800 mg by mouth daily as needed (pain).    Marland Kitchen OVER THE COUNTER MEDICATION Apply 1 application topically daily as needed (rash). OTC anti-fungal  medication - cream    . Vitamin D, Ergocalciferol, (DRISDOL) 50000 units CAPS capsule Take 50,000 Units by mouth every Thursday.    Marland Kitchen atorvastatin (LIPITOR) 40 MG tablet Take 1 tablet (40 mg total) by mouth daily. 90 tablet 3   No current facility-administered medications for this visit.     Allergies:   Penicillins    Social History:  The patient  reports that she is a non-smoker but has been exposed to tobacco smoke. She has never used smokeless tobacco. She reports that she drank alcohol. She reports that she does not use drugs.   Family History:  The patient's family history includes Heart disease in her father.    ROS: All other systems are reviewed and negative. Unless otherwise mentioned in H&P    PHYSICAL EXAM: VS:  BP 140/76   Pulse 88   Ht 4' 10.5" (1.486 m)   Wt 252 lb (114.3 kg)   SpO2 97%   BMI 51.77 kg/m  , BMI Body mass index is 51.77 kg/m. GEN: Well nourished, well developed, in no acute distress, morbidly obese.  HEENT: normal Neck: no JVD, carotid bruits, or masses Cardiac: RRR; no murmurs, rubs, or gallops,no edema  Respiratory:  Clear to auscultation bilaterally, normal work of breathing.Frequent cough, non-productive.  GI: soft, nontender, nondistended, + BS MS: no deformity or atrophy, ambulates with cane.  Skin: warm and dry, no rash Neuro:  Strength and sensation are intact Psych: euthymic  mood, full affect   EKG:  Not completed this office visit.   Recent Labs: 10/24/2017: B Natriuretic Peptide 14.8 10/25/2017: BUN 11; Creatinine, Ser 0.66; Hemoglobin 14.4; Platelets 319; Potassium 4.1; Sodium 139; TSH 1.772    Lipid Panel    Component Value Date/Time   CHOL 204 (H) 10/25/2017 0308   TRIG 109 10/25/2017 0308   HDL 40 (L) 10/25/2017 0308   CHOLHDL 5.1 10/25/2017 0308   VLDL 22 10/25/2017 0308   LDLCALC 142 (H) 10/25/2017 0308      Wt Readings from Last 3 Encounters:  11/04/17 252 lb (114.3 kg)  10/24/17 252 lb (114.3 kg)       Other studies Reviewed: None  ASSESSMENT AND PLAN:  1. Hypertension: BP is slightly elevated today but she is anxious. She will continue HCTZ for now. BMET will be ordered for evaluation of kidney function  Echocardiogram will be completed for evaluation of LV fx and pulmonary pressures.   2. DOE: I have concerns about OSA as well. For now will rule out ischemia by scheduling a 2 day Lexiscan. This has been explained to her and she is willing to proceed.   3. Hypercholesterolemia: LDL is 146, with TC of 204. Will begin atorvastatin 40 mg daily. Recheck lipids in 6 weeks. She is given low cholesterol diet instructions.    Current medicines are reviewed at length with the patient today.    Labs/ tests ordered today include: 2 -Day Lexiscan, Echocardiogram BMET.  Phill Myron. [place] Pugh, ANP, AACC   11/04/2017 11:29 AM    [place] [address] Office [phone] Fax [phone]

## 2017-11-03 ENCOUNTER — Ambulatory Visit: Payer: BC Managed Care – PPO | Admitting: Adult Health

## 2017-11-04 ENCOUNTER — Ambulatory Visit (INDEPENDENT_AMBULATORY_CARE_PROVIDER_SITE_OTHER): Payer: Medicare Other | Admitting: Adult Health

## 2017-11-04 ENCOUNTER — Encounter: Payer: Self-pay | Admitting: Adult Health

## 2017-11-04 VITALS — BP 140/76 | HR 88 | Ht 58.5 in | Wt 252.0 lb

## 2017-11-04 DIAGNOSIS — E7800 Pure hypercholesterolemia, unspecified: Secondary | ICD-10-CM

## 2017-11-04 DIAGNOSIS — I519 Heart disease, unspecified: Secondary | ICD-10-CM | POA: Diagnosis not present

## 2017-11-04 DIAGNOSIS — R0600 Dyspnea, unspecified: Secondary | ICD-10-CM | POA: Diagnosis not present

## 2017-11-04 DIAGNOSIS — I10 Essential (primary) hypertension: Secondary | ICD-10-CM

## 2017-11-04 DIAGNOSIS — R079 Chest pain, unspecified: Secondary | ICD-10-CM | POA: Diagnosis not present

## 2017-11-04 DIAGNOSIS — Z79899 Other long term (current) drug therapy: Secondary | ICD-10-CM

## 2017-11-04 MED ORDER — ATORVASTATIN CALCIUM 40 MG PO TABS: 40.0000 mg | ORAL_TABLET | Freq: Every day | ORAL | 3 refills | Status: DC

## 2017-11-04 NOTE — Patient Instructions (Signed)
Medication Instructions:  START ATORVASTATIN 40MG  AT NIGHT  If you need a refill on your cardiac medications before your next appointment, please call your pharmacy.  Labwork: A1C, CBC AND BMET TODAY HERE IN OUR OFFICE AT [facility]  Take the provided lab slips with you to the lab for your blood draw.   If you have labs (blood work) drawn today and your tests are completely normal, you will receive your results only by: Marland Kitchen MyChart Message (if you have MyChart) OR . A paper copy in the mail If you have any lab test that is abnormal or we need to change your treatment, we will call you to review the results.  Testing/Procedures: Your physician has requested that you have a lexiscan myoview. A cardiac stress test is a cardiological test that measures the heart's ability to respond to external stress in a controlled clinical environment. The stress response is induced by intravenous pharmacological stimulation.   Echocardiogram - Your physician has requested that you have an echocardiogram. Echocardiography is a painless test that uses sound waves to create images of your heart. It provides your doctor with information about the size and shape of your heart and how well your heart's chambers and valves are working. This procedure takes approximately one hour. There are no restrictions for this procedure. This will be performed at our [facility] location - [address], [address].  Special Instructions: PLEASE FOLLOW LOW CHOLESTEROL DIET  Follow-Up: You will need a follow up appointment in [place].   You may see Jory Sims, DNP, ANP ,  DR [facility] or one of the following Advanced Practice Providers on your designated Care Team:   . Kerin Ransom, PA-C . Daleen Snook Kroeger, PA-C . Sande Rives, PA-C  At [facility], you and your health needs are our priority.  As part of our continuing mission to provide you with exceptional heart care, we have created designated Provider Care  Teams.  These Care Teams include your primary Cardiologist (physician) and Advanced Practice Providers (APPs -  Physician Assistants and Nurse Practitioners) who all work together to provide you with the care you need, when you need it.  Thank you for choosing [facility] at [facility]!!

## 2017-11-05 LAB — BASIC METABOLIC PANEL
BUN/Creatinine Ratio: 24 (ref 12–28)
BUN: 18 mg/dL (ref 8–27)
CO2: 27 mmol/L (ref 20–29)
CREATININE: 0.75 mg/dL (ref 0.57–1.00)
Calcium: 9.4 mg/dL (ref 8.7–10.3)
Chloride: 97 mmol/L (ref 96–106)
GFR calc Af Amer: 94 mL/min/{1.73_m2} (ref >59)
GFR, EST NON AFRICAN AMERICAN: 82 mL/min/{1.73_m2} (ref >59)
Glucose: 112 mg/dL — ABNORMAL HIGH (ref 65–99)
Potassium: 4.4 mmol/L (ref 3.5–5.2)
SODIUM: 142 mmol/L (ref 134–144)

## 2017-11-05 LAB — CBC
HEMATOCRIT: 45.4 % (ref 34.0–46.6)
HEMOGLOBIN: 15.1 g/dL (ref 11.1–15.9)
MCH: 27.3 pg (ref 26.6–33.0)
MCHC: 33.3 g/dL (ref 31.5–35.7)
MCV: 82 fL (ref 79–97)
Platelets: 345 10*3/uL (ref 150–450)
RBC: 5.54 x10E6/uL — ABNORMAL HIGH (ref 3.77–5.28)
RDW: 14.2 % (ref 12.3–15.4)
WBC: 10.8 10*3/uL (ref 3.4–10.8)

## 2017-11-05 LAB — HEMOGLOBIN A1C
ESTIMATED AVERAGE GLUCOSE: 134 mg/dL
Hgb A1c MFr Bld: 6.3 % — ABNORMAL HIGH (ref 4.8–5.6)

## 2017-11-06 ENCOUNTER — Telehealth: Payer: Self-pay | Admitting: Adult Health

## 2017-11-06 NOTE — Telephone Encounter (Signed)
Follow up    Pt returning call for [facility] about lab results

## 2017-11-10 ENCOUNTER — Other Ambulatory Visit (HOSPITAL_COMMUNITY): Payer: Medicare Other

## 2017-11-10 ENCOUNTER — Telehealth (HOSPITAL_COMMUNITY): Payer: Self-pay

## 2017-11-10 NOTE — Telephone Encounter (Signed)
Encounter complete. 

## 2017-11-11 NOTE — Progress Notes (Signed)
Cardiology Office Note   Date:  11/16/2017   ID:  Amber Huffman, DOB 05/19/1950, MRN [id number]  PCP:  System, Pcp Not In  Cardiologist: [place]  Chief Complaint  Patient presents with  . Hypertension  . Hyperlipidemia     History of Present Illness: Amber Huffman is a 67 y.o. 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 Described intermittent history of chest pressure with associated dyspnea. After having symptoms at rest while watching TV. She was noted to be hypertensive. She was ruled out for ACS.   She was seen on hospital follow up on 11/04/2017, with multiple complaints. Due to elevated TC and LDL she was started on atorvastatin 40 mg daily, she was scheduled for a sleep study due to hypertension, body habitus, causing DOE. She was also scheduled for a 2 day stress myoview.   Response to Stress There was no ST segment deviation noted during stress.  Arrhythmias during stress: none.  Arrhythmias during recovery: none.  There were no significant arrhythmias noted during the test.  ECG was uninterpretable.   She comes today feeling well. She still has a tickle cough but does not use the [employer] often. She is reluctant to take medications. She is tolerating the atorvastatin and HCTZ.    Past Medical History:  Diagnosis Date  . Morbid obesity with BMI of 50.0-59.9, adult [facility])     Past Surgical History:  Procedure Laterality Date  . ABDOMINAL HYSTERECTOMY       Current Outpatient Medications  Medication Sig Dispense Refill  . atorvastatin (LIPITOR) 40 MG tablet Take 1 tablet (40 mg total) by mouth daily. 90 tablet 3  . hydrochlorothiazide (MICROZIDE) 12.5 MG capsule Take 1 capsule (12.5 mg total) by mouth daily. 30 capsule 1  . ibuprofen (ADVIL,MOTRIN) 800 MG tablet Take 800 mg by mouth daily as needed (pain).    Marland Kitchen OVER THE COUNTER MEDICATION Apply 1 application topically daily as  needed (rash). OTC anti-fungal medication - cream    . Vitamin D, Ergocalciferol, (DRISDOL) 50000 units CAPS capsule Take 50,000 Units by mouth every Thursday.    Marland Kitchen omeprazole (PRILOSEC) 20 MG capsule Take 1 capsule (20 mg total) by mouth daily. 30 capsule 11   No current facility-administered medications for this visit.     Allergies:   Penicillins    Social History:  The patient  reports that she is a non-smoker but has been exposed to tobacco smoke. She has never used smokeless tobacco. She reports that she drank alcohol. She reports that she does not use drugs.   Family History:  The patient's family history includes Heart disease in her father.    ROS: All other systems are reviewed and negative. Unless otherwise mentioned in H&P    PHYSICAL EXAM: VS:  BP 108/62   Pulse 97   Ht 4\' 9"  (1.448 m)   Wt 251 lb 12.8 oz (114.2 kg)   BMI 54.49 kg/m  , BMI Body mass index is 54.49 kg/m. GEN: Well nourished, well developed, in no acute distress HEENT: normal Neck: no JVD, carotid bruits, or masses Cardiac: RRR; 1/6 systolic murmur LSB, No, rubs, or gallops,no edema  Respiratory:  Clear to auscultation bilaterally, normal work of breathing.Frequent upper airway cough,  GI: soft, nontender, nondistended, + BS MS: no deformity or atrophy Skin: warm and dry, no rash Neuro:  Strength and sensation are intact Psych: euthymic mood, full affect  EKG:  Not completed this office visit.   Recent Labs: 10/24/2017: B Natriuretic Peptide 14.8 10/25/2017: TSH 1.772 11/04/2017: BUN 18; Creatinine, Ser 0.75; Hemoglobin 15.1; Platelets 345; Potassium 4.4; Sodium 142    Lipid Panel    Component Value Date/Time   CHOL 204 (H) 10/25/2017 0308   TRIG 109 10/25/2017 0308   HDL 40 (L) 10/25/2017 0308   CHOLHDL 5.1 10/25/2017 0308   VLDL 22 10/25/2017 0308   LDLCALC 142 (H) 10/25/2017 0308      Wt Readings from Last 3 Encounters:  11/16/17 251 lb 12.8 oz (114.2 kg)  2017/12/09 252 lb  (114.3 kg)  11/04/17 252 lb (114.3 kg)      Other studies Reviewed: Echocardiogram 2017-12-09  Left ventricle: The cavity size was normal. There was moderate   concentric hypertrophy. Systolic function was normal. The   estimated ejection fraction was in the range of 60% to 65%. Wall   motion was normal; there were no regional wall motion   abnormalities. There was an increased relative contribution of   atrial contraction to ventricular filling. Doppler parameters are   consistent with abnormal left ventricular relaxation (grade 1   diastolic dysfunction). - Mitral valve: Calcified annulus. There was trivial regurgitation.  ASSESSMENT AND PLAN:  1. Hypertension: BP is much better controlled today. She is without complaints of dizziness or cramping. Will continue the low dose HCTZ 12.5 mg .   2. Hypercholesterolemia: She will continue atorvastatin as directed. Will need follow up lab in 2 more months.   3. GERD: She has a persistent cough likely related to GERD. She will start PPI, omeprazole 20 mg OTC to assist in this. If necessary may need to be referred to GI. Will defer to PCP.   4. Obesity: She is okay to start an exercise program now. Silver Sneakers. She is advised to start slowly and work her way up in time and intensity.    Current medicines are reviewed at length with the patient today.    Labs/ tests ordered today include: Need follow up labs, per PCP seeing her in the next few weeks.   Phill Myron. West Pugh, ANP, AACC   11/16/2017 2:11 PM    [facility] [place] [address] Office [phone] Fax [phone]

## 2017-11-12 ENCOUNTER — Ambulatory Visit (HOSPITAL_BASED_OUTPATIENT_CLINIC_OR_DEPARTMENT_OTHER): Payer: Medicare Other

## 2017-11-12 ENCOUNTER — Ambulatory Visit (HOSPITAL_COMMUNITY)
Admission: RE | Admit: 2017-11-12 | Discharge: 2017-11-12 | Disposition: A | Discharge status: Home / Self Care | Payer: Medicare Other | Source: Ambulatory Visit | Attending: Cardiology | Admitting: Cardiology

## 2017-11-12 ENCOUNTER — Other Ambulatory Visit: Payer: Self-pay

## 2017-11-12 DIAGNOSIS — I519 Heart disease, unspecified: Secondary | ICD-10-CM | POA: Insufficient documentation

## 2017-11-12 DIAGNOSIS — R079 Chest pain, unspecified: Secondary | ICD-10-CM | POA: Diagnosis present

## 2017-11-12 DIAGNOSIS — R0600 Dyspnea, unspecified: Secondary | ICD-10-CM | POA: Diagnosis present

## 2017-11-12 DIAGNOSIS — I10 Essential (primary) hypertension: Secondary | ICD-10-CM | POA: Insufficient documentation

## 2017-11-12 LAB — ECHOCARDIOGRAM COMPLETE
Height: 59 in
WEIGHTICAEL: 4032 [oz_av]

## 2017-11-12 MED ORDER — REGADENOSON 0.4 MG/5ML IV SOLN
0.4000 mg | Freq: Once | INTRAVENOUS | Status: AC
  Administered 2017-11-12: 0.4 mg via INTRAVENOUS

## 2017-11-12 MED ORDER — TECHNETIUM TC 99M TETROFOSMIN IV KIT
27.6000 | PACK | Freq: Once | INTRAVENOUS | Status: AC | PRN
  Administered 2017-11-12: 27.6 via INTRAVENOUS
  Filled 2017-11-12: qty 28

## 2017-11-13 ENCOUNTER — Ambulatory Visit (HOSPITAL_COMMUNITY)
Admission: RE | Admit: 2017-11-13 | Discharge: 2017-11-13 | Disposition: A | Discharge status: Home / Self Care | Payer: Medicare Other | Source: Ambulatory Visit | Attending: Cardiovascular Disease | Admitting: Cardiovascular Disease

## 2017-11-13 LAB — MYOCARDIAL PERFUSION IMAGING
CHL CUP NUCLEAR SDS: 7
LV sys vol: 8 mL
LVDIAVOL: 42 mL (ref 46–106)
NUC STRESS TID: 0.64
Peak HR: 107 {beats}/min
Rest HR: 81 {beats}/min
SRS: 1
SSS: 8

## 2017-11-13 MED ORDER — TECHNETIUM TC 99M TETROFOSMIN IV KIT
31.0000 | PACK | Freq: Once | INTRAVENOUS | Status: AC | PRN
  Administered 2017-11-13: 31 via INTRAVENOUS

## 2017-11-16 ENCOUNTER — Ambulatory Visit (INDEPENDENT_AMBULATORY_CARE_PROVIDER_SITE_OTHER): Payer: Medicare Other | Admitting: Adult Health

## 2017-11-16 ENCOUNTER — Encounter: Payer: Self-pay | Admitting: Adult Health

## 2017-11-16 VITALS — BP 108/62 | HR 97 | Ht <= 58 in | Wt 251.8 lb

## 2017-11-16 DIAGNOSIS — I10 Essential (primary) hypertension: Secondary | ICD-10-CM

## 2017-11-16 DIAGNOSIS — K219 Gastro-esophageal reflux disease without esophagitis: Secondary | ICD-10-CM

## 2017-11-16 DIAGNOSIS — E7800 Pure hypercholesterolemia, unspecified: Secondary | ICD-10-CM

## 2017-11-16 MED ORDER — OMEPRAZOLE 20 MG PO CPDR: 20.0000 mg | DELAYED_RELEASE_CAPSULE | Freq: Every day | ORAL | 11 refills | Status: DC

## 2017-11-16 NOTE — Patient Instructions (Signed)
Medication Instructions:  NO CHANGES- Your physician recommends that you continue on your current medications as directed. Please refer to the Current Medication list given to you today.  If you need a refill on your cardiac medications before your next appointment, please call your pharmacy.  Labwork: PLEASE HAVE PRIMARY MD FAX TO [phone] Take the provided lab slips with you to the lab for your blood draw.   If you have labs (blood work) drawn today and your tests are completely normal, you will receive your results only by: Marland Kitchen MyChart Message (if you have MyChart) OR . A paper copy in the mail If you have any lab test that is abnormal or we need to change your treatment, we will call you to review the results.  Follow-Up: You will need a follow up appointment in 3 MONTHS.  You may see  DR [facility] or one of the following Advanced Practice Providers on your designated Care Team:   . Kerin Ransom, PA-C  At [facility], you and your health needs are our priority.  As part of our continuing mission to provide you with exceptional heart care, we have created designated Provider Care Teams.  These Care Teams include your primary Cardiologist (physician) and Advanced Practice Providers (APPs -  Physician Assistants and Nurse Practitioners) who all work together to provide you with the care you need, when you need it.  Thank you for choosing [facility] at [facility]!!

## 2017-11-16 NOTE — Progress Notes (Signed)
Letter mailed

## 2017-12-15 ENCOUNTER — Telehealth: Payer: Self-pay | Admitting: Adult Health

## 2017-12-15 DIAGNOSIS — E7800 Pure hypercholesterolemia, unspecified: Secondary | ICD-10-CM

## 2017-12-15 DIAGNOSIS — Z79899 Other long term (current) drug therapy: Secondary | ICD-10-CM

## 2017-12-15 NOTE — Telephone Encounter (Signed)
Returned call to patient of Amber Huffman. She reports she gets winded after taking a shower, feels like every "limb weighs about 1000lbs". She has had terrible cramping on lipitor. She accidentally missed a dose and felt good, thus she did not take the subsequent evening. She feels much better OFF the medication.   She would like to have lab work done - she has been on atorvastatin for about 6 weeks.   Routed to DNP to review & advise

## 2017-12-15 NOTE — Telephone Encounter (Signed)
Patient called with DNP recommendations. She agrees w/plan. She will come in for labs after 12/10

## 2017-12-15 NOTE — Telephone Encounter (Signed)
° ° ° ° °  1. Name of Medication: atorvastatin (LIPITOR) 40 MG tablet  2. How are you currently taking this medication (dosage and times per day)? As written  3. Are you having a reaction (difficulty breathing--STAT)? no  4. What is your medication issue? Muscle cramps. Patient states she wants to discontinue taking med, also requesting labs

## 2017-12-15 NOTE — Telephone Encounter (Signed)
She can stop the statin for 7 days and get lipids and LFT's completed with BMET.

## 2017-12-23 LAB — BASIC METABOLIC PANEL
BUN / CREAT RATIO: 25 (ref 12–28)
BUN: 18 mg/dL (ref 8–27)
CO2: 28 mmol/L (ref 20–29)
Calcium: 9.5 mg/dL (ref 8.7–10.3)
Chloride: 95 mmol/L — ABNORMAL LOW (ref 96–106)
Creatinine, Ser: 0.73 mg/dL (ref 0.57–1.00)
GFR calc non Af Amer: 84 mL/min/{1.73_m2} (ref >59)
GFR, EST AFRICAN AMERICAN: 97 mL/min/{1.73_m2} (ref >59)
Glucose: 120 mg/dL — ABNORMAL HIGH (ref 65–99)
Potassium: 4.3 mmol/L (ref 3.5–5.2)
SODIUM: 141 mmol/L (ref 134–144)

## 2017-12-23 LAB — LIPID PANEL
CHOLESTEROL TOTAL: 216 mg/dL — AB (ref 100–199)
Chol/HDL Ratio: 4.5 ratio — ABNORMAL HIGH (ref 0.0–4.4)
HDL: 48 mg/dL (ref >39)
LDL Calculated: 148 mg/dL — ABNORMAL HIGH (ref 0–99)
TRIGLYCERIDES: 102 mg/dL (ref 0–149)
VLDL Cholesterol Cal: 20 mg/dL (ref 5–40)

## 2017-12-23 LAB — HEPATIC FUNCTION PANEL
ALK PHOS: 121 IU/L — AB (ref 39–117)
ALT: 17 IU/L (ref 0–32)
AST: 15 IU/L (ref 0–40)
Albumin: 4.3 g/dL (ref 3.6–4.8)
Bilirubin Total: 0.3 mg/dL (ref 0.0–1.2)
Bilirubin, Direct: 0.08 mg/dL (ref 0.00–0.40)
TOTAL PROTEIN: 6.9 g/dL (ref 6.0–8.5)

## 2017-12-25 ENCOUNTER — Telehealth: Payer: Self-pay | Admitting: Cardiovascular Disease

## 2017-12-25 ENCOUNTER — Other Ambulatory Visit: Payer: Self-pay

## 2017-12-25 DIAGNOSIS — Z79899 Other long term (current) drug therapy: Secondary | ICD-10-CM

## 2017-12-25 DIAGNOSIS — E7800 Pure hypercholesterolemia, unspecified: Secondary | ICD-10-CM

## 2017-12-25 MED ORDER — ATORVASTATIN CALCIUM 80 MG PO TABS: 40.0000 mg | ORAL_TABLET | Freq: Every day | ORAL | 3 refills | Status: DC

## 2017-12-25 NOTE — Telephone Encounter (Signed)
lm2cb  

## 2017-12-25 NOTE — Telephone Encounter (Signed)
Per pt call wants results today if possible please give her a call back even if it's after 5pm.  Pt returning this office call.

## 2017-12-25 NOTE — Progress Notes (Signed)
Notes recorded by Lendon Colonel, NP on 12/23/2017 at 4:46 PM EST Cholesterol level has not improved on atorvastatin 40 mg daily. Please go up on atorvastatin to 80 mg daily. LDL is 148, should be close to 70. May need to add Zetia if not better. Check again in 2 months.

## 2017-12-25 NOTE — Telephone Encounter (Signed)
Pt  Returned call to pt she states that she has stopped the Lipitor, and she feels "so much better". Pt informed of providers result & recommendations.  She states that she will not re-start Lipitor and does not want to take Zetia either.  Pt was also wondering, if KL could recommend a local PCP. I informed pt to  call [facility] for Physician referral at: [phone] for PCP recommendation, verbalized that she will call them. Went over all other results and pt would like this message sent to [facility] asking: Does she have a cardiac dx other than HTN? Also, should she be taking ASA?

## 2017-12-27 NOTE — Telephone Encounter (Signed)
She does not have heart disease per recent hospitalization and does not need to take ASA.. I would like to send her to Pharmacy to discuss PSCK-9 inhibitor if she refuses po statins due to intolerance. Thank you for referring her to [facility] number for recommendation of new PCP

## 2017-12-29 NOTE — Telephone Encounter (Signed)
Pt notified of providers comments and she will   Send message to scheduling

## 2017-12-30 ENCOUNTER — Telehealth: Payer: Self-pay | Admitting: *Deleted

## 2017-12-30 NOTE — Telephone Encounter (Signed)
Left message for patient to call and schedule PSCK-9 discussion with Pharm D

## 2018-01-18 NOTE — Patient Instructions (Addendum)
Lipid Clinic (pharmacist) Raquel/Kristin [phone]  *START taking rosuvastatin 5mg  every Friday for 2 weeks, then increase to 5mg  Mondays and Fridays if tolerating* *Fasting blood work in 4 weeks during appt with Dr Oval Linsey*   Cholesterol  Cholesterol is a fat. Your body needs a small amount of cholesterol. Cholesterol (plaque) may build up in your blood vessels (arteries). That makes you more likely to have a heart attack or stroke. You cannot feel your cholesterol level. Having a blood test is the only way to find out if your level is high. Keep your test results. Work with your doctor to keep your cholesterol at a good level. What do the results mean?  Total cholesterol is how much cholesterol is in your blood.  LDL is bad cholesterol. This is the type that can build up. Try to have low LDL.  HDL is good cholesterol. It cleans your blood vessels and carries LDL away. Try to have high HDL.  Triglycerides are fat that the body can store or burn for energy. What are good levels of cholesterol?  Total cholesterol below 200.  LDL below 100 is good for people who have health risks. LDL below 70 is good for people who have very high risks.  HDL above 40 is good. It is best to have HDL of 60 or higher.  Triglycerides below 150. How can I lower my cholesterol? Diet Follow your diet program as told by your doctor.  Choose fish, white meat chicken, or [place] that is roasted or baked. Try not to eat red meat, fried foods, sausage, or lunch meats.  Eat lots of fresh fruits and vegetables.  Choose whole grains, beans, pasta, potatoes, and cereals.  Choose olive oil, corn oil, or canola oil. Only use small amounts.  Try not to eat butter, mayonnaise, shortening, or palm kernel oils.  Try not to eat foods with trans fats.  Choose low-fat or nonfat dairy foods. ? Drink skim or nonfat milk. ? Eat low-fat or nonfat yogurt and cheeses. ? Try not to drink whole milk or  cream. ? Try not to eat ice cream, egg yolks, or full-fat cheeses.  Healthy desserts include angel food cake, ginger snaps, animal crackers, hard candy, popsicles, and low-fat or nonfat frozen yogurt. Try not to eat pastries, cakes, pies, and cookies.  Exercise Follow your exercise program as told by your doctor.  Be more active. Try gardening, walking, and taking the stairs.  Ask your doctor about ways that you can be more active. Medicine  Take over-the-counter and prescription medicines only as told by your doctor. This information is not intended to replace advice given to you by your health care provider. Make sure you discuss any questions you have with your health care provider. Document Released: 03/28/2008 Document Revised: 08/01/2015 Document Reviewed: 07/12/2015 Elsevier Interactive Patient Education  2019 [place] refers to food and lifestyle choices that are based on the traditions of countries located on the [employer]. This way of eating has been shown to help prevent certain conditions and improve outcomes for people who have chronic diseases, like kidney disease and heart disease. What are tips for following this plan? Lifestyle  Cook and eat meals together with your family, when possible.  Drink enough fluid to keep your urine clear or pale yellow.  Be physically active every day. This includes: ? Aerobic exercise like running or swimming. ? Leisure activities like gardening, walking, or housework.  Get  7-8 hours of sleep each night.  If recommended by your health care provider, drink red wine in moderation. This means 1 glass a day for nonpregnant women and 2 glasses a day for men. A glass of wine equals 5 oz (150 mL). Reading food labels   Check the serving size of packaged foods. For foods such as rice and pasta, the serving size refers to the amount of cooked product, not dry.  Check the total fat  in packaged foods. Avoid foods that have saturated fat or trans fats.  Check the ingredients list for added sugars, such as corn syrup. Shopping  At the grocery store, buy most of your food from the areas near the walls of the store. This includes: ? Fresh fruits and vegetables (produce). ? Grains, beans, nuts, and seeds. Some of these may be available in unpackaged forms or large amounts (in bulk). ? Fresh seafood. ? Poultry and eggs. ? Low-fat dairy products.  Buy whole ingredients instead of prepackaged foods.  Buy fresh fruits and vegetables in-season from local farmers markets.  Buy frozen fruits and vegetables in resealable bags.  If you do not have access to quality fresh seafood, buy precooked frozen shrimp or canned fish, such as tuna, salmon, or sardines.  Buy small amounts of raw or cooked vegetables, salads, or olives from the deli or salad bar at your store.  Stock your pantry so you always have certain foods on hand, such as olive oil, canned tuna, canned tomatoes, rice, pasta, and beans. Cooking  Cook foods with extra-virgin olive oil instead of using butter or other vegetable oils.  Have meat as a side dish, and have vegetables or grains as your main dish. This means having meat in small portions or adding small amounts of meat to foods like pasta or stew.  Use beans or vegetables instead of meat in common dishes like chili or lasagna.  Experiment with different cooking methods. Try roasting or broiling vegetables instead of steaming or sauteing them.  Add frozen vegetables to soups, stews, pasta, or rice.  Add nuts or seeds for added healthy fat at each meal. You can add these to yogurt, salads, or vegetable dishes.  Marinate fish or vegetables using olive oil, lemon juice, garlic, and fresh herbs. Meal planning   Plan to eat 1 vegetarian meal one day each week. Try to work up to 2 vegetarian meals, if possible.  Eat seafood 2 or more times a week.  Have  healthy snacks readily available, such as: ? Vegetable sticks with hummus. ? [place] yogurt. ? Fruit and nut trail mix.  Eat balanced meals throughout the week. This includes: ? Fruit: 2-3 servings a day ? Vegetables: 4-5 servings a day ? Low-fat dairy: 2 servings a day ? Fish, poultry, or lean meat: 1 serving a day ? Beans and legumes: 2 or more servings a week ? Nuts and seeds: 1-2 servings a day ? Whole grains: 6-8 servings a day ? Extra-virgin olive oil: 3-4 servings a day  Limit red meat and sweets to only a few servings a month What are my food choices?  Mediterranean diet ? Recommended ? Grains: Whole-grain pasta. Brown rice. Bulgar wheat. Polenta. Couscous. Whole-wheat bread. [place]. ? Vegetables: Artichokes. Beets. Broccoli. Cabbage. Carrots. Eggplant. Green beans. Chard. Kale. Spinach. Onions. Leeks. Peas. Squash. Tomatoes. Peppers. Radishes. ? Fruits: Apples. Apricots. Avocado. Berries. Bananas. Cherries. Dates. Figs. Grapes. Lemons. Melon. Oranges. Peaches. Plums. Pomegranate. ? Meats and other protein foods: Beans. Almonds. Sunflower  seeds. Pine nuts. Peanuts. [place]. Salmon. Scallops. Shrimp. [place]. Tilapia. Clams. Oysters. Eggs. ? Dairy: Low-fat milk. Cheese. Greek yogurt. ? Beverages: Water. Red wine. Herbal tea. ? Fats and oils: Extra virgin olive oil. Avocado oil. Grape seed oil. ? Sweets and desserts: [place] yogurt with honey. Baked apples. Poached pears. Trail mix. ? Seasoning and other foods: Basil. Cilantro. Coriander. Cumin. Mint. Parsley. Sage. Rosemary. Tarragon. Garlic. Oregano. Thyme. Pepper. Balsalmic vinegar. Tahini. Hummus. Tomato sauce. Olives. Mushrooms. ? Limit these ? Grains: Prepackaged pasta or rice dishes. Prepackaged cereal with added sugar. ? Vegetables: Deep fried potatoes (french fries). ? Fruits: Fruit canned in syrup. ? Meats and other protein foods: Beef. Pork. Lamb. Poultry with skin. Hot dogs. Berniece Salines. ? Dairy: Ice cream. Sour cream.  Whole milk. ? Beverages: Juice. Sugar-sweetened soft drinks. Beer. Liquor and spirits. ? Fats and oils: Butter. Canola oil. Vegetable oil. Beef fat (tallow). Lard. ? Sweets and desserts: Cookies. Cakes. Pies. Candy. ? Seasoning and other foods: Mayonnaise. Premade sauces and marinades. ? The items listed may not be a complete list. Talk with your dietitian about what dietary choices are right for you. Summary  The Mediterranean diet includes both food and lifestyle choices.  Eat a variety of fresh fruits and vegetables, beans, nuts, seeds, and whole grains.  Limit the amount of red meat and sweets that you eat.  Talk with your health care provider about whether it is safe for you to drink red wine in moderation. This means 1 glass a day for nonpregnant women and 2 glasses a day for men. A glass of wine equals 5 oz (150 mL). This information is not intended to replace advice given to you by your health care provider. Make sure you discuss any questions you have with your health care provider. Document Released: 08/23/2015 Document Revised: 09/25/2015 Document Reviewed: 08/23/2015 Elsevier Interactive Patient Education  2019 [employer].

## 2018-01-19 ENCOUNTER — Ambulatory Visit (INDEPENDENT_AMBULATORY_CARE_PROVIDER_SITE_OTHER): Payer: Medicare Other | Admitting: Pharmacist

## 2018-01-19 VITALS — BP 126/84 | HR 84 | Resp 14

## 2018-01-19 DIAGNOSIS — E7800 Pure hypercholesterolemia, unspecified: Secondary | ICD-10-CM

## 2018-01-19 MED ORDER — ROSUVASTATIN CALCIUM 5 MG PO TABS: ORAL_TABLET | ORAL | 1 refills | Status: DC

## 2018-01-19 NOTE — Progress Notes (Signed)
Amber Huffman ID: Amber Huffman                 DOB: [birth year]                    MRN: [id number]     HPI: Amber Huffman is a 68 y.o. female Amber Huffman of Dr Amber Huffman referred to lipid clinic by K. Lawrence NP. PMH is significant for chest pain, hypercholesterolemia, and hypertension. Amber Huffman tried atorvastatin in the past and developed severe body aches. Phone note form 12/25/2017 states Amber Huffman unwilling to try Zetia but Amber Huffman has no recollection of this recommendation. 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  Amber Huffman presents today for potential PCSK9i initiation and ,medication management.   Current Medications: none  Intolerances:  Atorvastatin 40mg  - severe body aches  LDL goal: 100mg /dL  Diet: rich in bread, butter and cheese. Amber Huffman is working on decreasing butter and cheese from diet. Contemplating start DASH/mediterranean diet.  Exercise: recently enrolled at the [facility] for pool and aerobics excercises  Family History: family history includes Heart disease in her father.   Social History: [name]  reports that Amber Huffman is a non-smoker but has been exposed to tobacco smoke. Amber Huffman has never used smokeless tobacco. Amber Huffman reports that Amber Huffman drank alcohol. Amber Huffman reports that Amber Huffman does not use drugs.   Labs: 12/23/2017: CHO 216; TG 102; HDL 48; LDL-c 148 (no therapy)  Past Medical History:  Diagnosis Date  . Morbid obesity with BMI of 50.0-59.9, adult [facility])     Current Outpatient Medications on File Prior to Visit  Medication Sig Dispense Refill  . hydrochlorothiazide (MICROZIDE) 12.5 MG capsule Take 1 capsule (12.5 mg total) by mouth daily. (Amber Huffman taking differently: Take 25 mg by mouth daily. ) 30 capsule 1  . ibuprofen (ADVIL,MOTRIN) 800 MG tablet Take 800 mg by mouth daily as needed (pain).    Marland Kitchen OVER THE COUNTER MEDICATION Apply 1 application topically daily as needed (rash). OTC anti-fungal medication - cream    . Vitamin D,  Ergocalciferol, (DRISDOL) 50000 units CAPS capsule Take 50,000 Units by mouth every Thursday.    Marland Kitchen atorvastatin (LIPITOR) 80 MG tablet Take 0.5 tablets (40 mg total) by mouth daily. (Amber Huffman not taking: Reported on 01/19/2018) 90 tablet 3  . omeprazole (PRILOSEC) 20 MG capsule Take 1 capsule (20 mg total) by mouth daily. (Amber Huffman not taking: Reported on 01/19/2018) 30 capsule 11   No current facility-administered medications on file prior to visit.     Allergies  Allergen Reactions  . Penicillins Rash    Has Amber Huffman had a PCN reaction causing immediate rash, facial/tongue/throat swelling, SOB or lightheadedness with hypotension: Yes Has Amber Huffman had a PCN reaction causing severe rash involving mucus membranes or skin necrosis: No Has Amber Huffman had a PCN reaction that required hospitalization: No Has Amber Huffman had a PCN reaction occurring within the last 10 years: No If all of the above answers are "NO", then may proceed with Cephalosporin use.    Pure hypercholesterolemia LDL remains greatly above desired goal for primary prevention. Amber Huffman already working on positive lifestyle modifications like diet and exercise. After lengthy discussion, Amber Huffman is willing to try low dose rosuvastatin therapy prior to considering injectable products. Amber Huffman is to start rosuvastatin 5mg  every Friday for 2 weeks, then increase to 5mg  2x/weekly until scheduled follow up with DR Amber Huffman. Plan to repeat fasting lipid panel during OV and re-assess therapy. May increase rosuvastatin 5mg  to 3x/week  if tolerating or add ezetimibe 10mg  for additional lipid lowering action if needed.      Amber Huffman PharmD, BCPS, [place] [address] [place] [address] 01/27/2018 8:36 AM

## 2018-01-27 ENCOUNTER — Encounter: Payer: Self-pay | Admitting: Pharmacist

## 2018-01-27 NOTE — Assessment & Plan Note (Signed)
LDL remains greatly above desired goal for primary prevention. Patient already working on positive lifestyle modifications like diet and exercise. After lengthy discussion, she is willing to try low dose rosuvastatin therapy prior to considering injectable products. She is to start rosuvastatin 5mg  every Friday for 2 weeks, then increase to 5mg  2x/weekly until scheduled follow up with DR Oval Linsey. Plan to repeat fasting lipid panel during OV and re-assess therapy. May increase rosuvastatin 5mg  to 3x/week if tolerating or add ezetimibe 10mg  for additional lipid lowering action if needed.

## 2018-02-18 ENCOUNTER — Ambulatory Visit (INDEPENDENT_AMBULATORY_CARE_PROVIDER_SITE_OTHER): Payer: Medicare Other | Admitting: Cardiovascular Disease

## 2018-02-18 ENCOUNTER — Encounter: Payer: Self-pay | Admitting: Cardiovascular Disease

## 2018-02-18 VITALS — BP 124/68 | HR 103 | Ht <= 58 in | Wt 247.4 lb

## 2018-02-18 DIAGNOSIS — I10 Essential (primary) hypertension: Secondary | ICD-10-CM

## 2018-02-18 DIAGNOSIS — E7800 Pure hypercholesterolemia, unspecified: Secondary | ICD-10-CM

## 2018-02-18 LAB — LIPID PANEL
CHOLESTEROL TOTAL: 213 mg/dL — AB (ref 100–199)
Chol/HDL Ratio: 5 ratio — ABNORMAL HIGH (ref 0.0–4.4)
HDL: 43 mg/dL (ref >39)
LDL CALC: 152 mg/dL — AB (ref 0–99)
TRIGLYCERIDES: 89 mg/dL (ref 0–149)
VLDL Cholesterol Cal: 18 mg/dL (ref 5–40)

## 2018-02-18 NOTE — Progress Notes (Signed)
Cardiology Office Note   Date:  02/18/2018   ID:  Amber Huffman, DOB Feb 12, 1950, MRN [id number]  PCP:  System, Pcp Not In  Cardiologist:   Skeet Latch, MD   No chief complaint on file.     History of Present Illness: Amber Huffman is a 68 y.o. female with morbid obesity, hypertension and hyperlipidemia here for follow up.  She was initially seen in the hospital 10/2017 where she presented with chest pain.  She was diagnosed with hypertension and hyperlipidemia that admission.  She was started on hydrochlorothiazide.  Cardiac enzymes were negative and EKG was unremarkable.  Her chest pain was felt to be atypical and she was set up for outpatient stress testing.  She followed up with Beckie Busing, DNP, 2 weeks later.  She had not filled her medication as she is "not a medicine person.".  She also had rescheduled her testing.  She had an echo later that month that revealed LVEF 60 to 65% with grade 1 diastolic dysfunction.  She had a Lexiscan Myoview that revealed normal systolic function and no ischemia.  Since that time she was started on atorvastatin but did not tolerate it due to myalgias.  She followed up with our pharmacist and they recommended starting rosuvastatin 5 mg twice a week and slowly increasing the frequency of the dose.  However she has not yet started the medication.  She wants to work on diet and exercise.  She has been very stressed lately dealing with the foreclosure of her father's home.  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  She plans to start walking and swimming daily.  She also wants to work on her diet and limiting second helpings.  She has not experienced any chest pain and her breathing has been stable.  She denies lower extremity edema, orthopnea, or PND.   Past Medical History:  Diagnosis Date  . Morbid obesity with BMI of 50.0-59.9, adult [facility])     Past Surgical History:  Procedure Laterality Date  . ABDOMINAL HYSTERECTOMY        Current Outpatient Medications  Medication Sig Dispense Refill  . hydrochlorothiazide (MICROZIDE) 12.5 MG capsule Take 1 capsule (12.5 mg total) by mouth daily. (Patient taking differently: Take 25 mg by mouth daily. ) 30 capsule 1  . ibuprofen (ADVIL,MOTRIN) 800 MG tablet Take 800 mg by mouth daily as needed (pain).    Marland Kitchen omeprazole (PRILOSEC) 20 MG capsule Take 1 capsule (20 mg total) by mouth daily. 30 capsule 11  . OVER THE COUNTER MEDICATION Apply 1 application topically daily as needed (rash). OTC anti-fungal medication - cream    . Vitamin D, Ergocalciferol, (DRISDOL) 50000 units CAPS capsule Take 50,000 Units by mouth every Thursday.     No current facility-administered medications for this visit.     Allergies:   Penicillins    Social History:  The patient  reports that she is a non-smoker but has been exposed to tobacco smoke. She has never used smokeless tobacco. She reports previous alcohol use. She reports that she does not use drugs.   Family History:  The patient's family history includes Heart disease in her father.    ROS:  Please see the history of present illness.   Otherwise, review of systems are positive for none.   All other systems are reviewed and negative.    PHYSICAL EXAM: VS:  BP 124/68   Pulse (!) 103   Ht 4\' 10"  (1.473 m)  Wt 247 lb 6.4 oz (112.2 kg)   SpO2 95%   BMI 51.71 kg/m  , BMI Body mass index is 51.71 kg/m. GENERAL:  Well appearing HEENT: Pupils equal round and reactive, fundi not visualized, oral mucosa unremarkable NECK:  No jugular venous distention, waveform within normal limits, carotid upstroke brisk and symmetric, no bruits, no thyromegaly LUNGS:  Clear to auscultation bilaterally HEART:  RRR.  PMI not di she might be able splaced or sustained,S1 and S2 within normal limits, no S3, no S4, no clicks, no rubs, no murmurs ABD:  Flat, positive bowel sounds normal in frequency in pitch, no bruits, no rebound, no guarding, no midline  pulsatile mass, no hepatomegaly, no splenomegaly EXT:  2 plus pulses throughout, no edema, no cyanosis no clubbing SKIN:  No rashes no nodules NEURO:  Cranial nerves II through XII grossly intact, motor grossly intact throughout PSYCH:  Cognitively intact, oriented to person place and time   EKG:  EKG is not ordered today.   Echo 11/12/17: Study Conclusions  - Left ventricle: The cavity size was normal. There was moderate   concentric hypertrophy. Systolic function was normal. The   estimated ejection fraction was in the range of 60% to 65%. Wall   motion was normal; there were no regional wall motion   abnormalities. There was an increased relative contribution of   atrial contraction to ventricular filling. Doppler parameters are   consistent with abnormal left ventricular relaxation (grade 1   diastolic dysfunction). - Mitral valve: Calcified annulus. There was trivial regurgitation.  Lexiscan Myoview 11/13/17:  Nuclear stress EF: 81%.  The left ventricular ejection fraction is hyperdynamic (>65%).  There was no ST segment deviation noted during stress.  The study is normal.  This is a low risk study.   Normal resting and stress perfusion. No ischemia or infarction EF 81%  Recent Labs: 10/24/2017: B Natriuretic Peptide 14.8 10/25/2017: TSH 1.772 11/04/2017: Hemoglobin 15.1; Platelets 345 12/23/2017: ALT 17; BUN 18; Creatinine, Ser 0.73; Potassium 4.3; Sodium 141    Lipid Panel    Component Value Date/Time   CHOL 216 (H) 12/23/2017 0852   TRIG 102 12/23/2017 0852   HDL 48 12/23/2017 0852   CHOLHDL 4.5 (H) 12/23/2017 0852   CHOLHDL 5.1 10/25/2017 0308   VLDL 22 10/25/2017 0308   LDLCALC 148 (H) 12/23/2017 0852      Wt Readings from Last 3 Encounters:  02/18/18 247 lb 6.4 oz (112.2 kg)  11/16/17 251 lb 12.8 oz (114.2 kg)  11/12/17 252 lb (114.3 kg)      ASSESSMENT AND PLAN:  # Atypical chest pain: Resolved.  Lexiscan Myoview negative 10/2017.  #  Hypertension: BP initially elevated and better onrepeat.  Continue HCTZ.  # Morbid obesity: # Hyperlipidemia: Ms. [name] previously tried atorvastatin and did not tolerate it.  She wants to focus on diet and exercise.  Rosuvastatin was ordered but she has not yet started this medication.  We will check fasting lipids today to get a new baseline.  She is going to start exercising 150 minutes/week and focus on portion control.  If her labs remain elevated at follow-up we will try low-dose rosuvastatin.  Current medicines are reviewed at length with the patient today.  The patient does not have concerns regarding medicines.  The following changes have been made:  no change  Labs/ tests ordered today include:   Orders Placed This Encounter  Procedures  . Lipid panel     Disposition:   FU  with Daniesha Driver C. Oval Linsey, MD, [facility] in 3 months.      Signed, Roger Kettles C. Oval Linsey, MD, [facility]  02/18/2018 10:12 AM    [place]

## 2018-02-18 NOTE — Patient Instructions (Addendum)
Medication Instructions:  Your physician recommends that you continue on your current medications as directed. Please refer to the Current Medication list given to you today.  If you need a refill on your cardiac medications before your next appointment, please call your pharmacy.   Lab work: LIPID TODAY   Testing/Procedures: NONE  Follow-Up: At [facility], you and your health needs are our priority.  As part of our continuing mission to provide you with exceptional heart care, we have created designated Provider Care Teams.  These Care Teams include your primary Cardiologist (physician) and Advanced Practice Providers (APPs -  Physician Assistants and Nurse Practitioners) who all work together to provide you with the care you need, when you need it. You will need a follow up appointment in 3 months. You may see DR [facility]  or one of the following Advanced Practice Providers on your designated Care Team:   Kerin Ransom, PA-C Roby Lofts, [place] . Sande Rives, PA-C  Any Other Special Instructions Will Be Listed Below (If Applicable). WORK HARDER ON DIET AND EXERCISE  TRY TO EXERCISE 150 MINUTES Onset Diet A Mediterranean diet refers to food and lifestyle choices that are based on the traditions of countries located on the [employer]. This way of eating has been shown to help prevent certain conditions and improve outcomes for people who have chronic diseases, like kidney disease and heart disease. What are tips for following this plan? Lifestyle  Cook and eat meals together with your family, when possible.  Drink enough fluid to keep your urine clear or pale yellow.  Be physically active every day. This includes: ? Aerobic exercise like running or swimming. ? Leisure activities like gardening, walking, or housework.  Get 7-8 hours of sleep each night.  If recommended by your health care provider, drink red wine in moderation. This means 1  glass a day for nonpregnant women and 2 glasses a day for men. A glass of wine equals 5 oz (150 mL). Reading food labels   Check the serving size of packaged foods. For foods such as rice and pasta, the serving size refers to the amount of cooked product, not dry.  Check the total fat in packaged foods. Avoid foods that have saturated fat or trans fats.  Check the ingredients list for added sugars, such as corn syrup. Shopping  At the grocery store, buy most of your food from the areas near the walls of the store. This includes: ? Fresh fruits and vegetables (produce). ? Grains, beans, nuts, and seeds. Some of these may be available in unpackaged forms or large amounts (in bulk). ? Fresh seafood. ? Poultry and eggs. ? Low-fat dairy products.  Buy whole ingredients instead of prepackaged foods.  Buy fresh fruits and vegetables in-season from local farmers markets.  Buy frozen fruits and vegetables in resealable bags.  If you do not have access to quality fresh seafood, buy precooked frozen shrimp or canned fish, such as tuna, salmon, or sardines.  Buy small amounts of raw or cooked vegetables, salads, or olives from the deli or salad bar at your store.  Stock your pantry so you always have certain foods on hand, such as olive oil, canned tuna, canned tomatoes, rice, pasta, and beans. Cooking  Cook foods with extra-virgin olive oil instead of using butter or other vegetable oils.  Have meat as a side dish, and have vegetables or grains as your main dish. This means having meat in small  portions or adding small amounts of meat to foods like pasta or stew.  Use beans or vegetables instead of meat in common dishes like chili or lasagna.  Experiment with different cooking methods. Try roasting or broiling vegetables instead of steaming or sauteing them.  Add frozen vegetables to soups, stews, pasta, or rice.  Add nuts or seeds for added healthy fat at each meal. You can add these  to yogurt, salads, or vegetable dishes.  Marinate fish or vegetables using olive oil, lemon juice, garlic, and fresh herbs. Meal planning   Plan to eat 1 vegetarian meal one day each week. Try to work up to 2 vegetarian meals, if possible.  Eat seafood 2 or more times a week.  Have healthy snacks readily available, such as: ? Vegetable sticks with hummus. ? [place] yogurt. ? Fruit and nut trail mix.  Eat balanced meals throughout the week. This includes: ? Fruit: 2-3 servings a day ? Vegetables: 4-5 servings a day ? Low-fat dairy: 2 servings a day ? Fish, poultry, or lean meat: 1 serving a day ? Beans and legumes: 2 or more servings a week ? Nuts and seeds: 1-2 servings a day ? Whole grains: 6-8 servings a day ? Extra-virgin olive oil: 3-4 servings a day  Limit red meat and sweets to only a few servings a month What are my food choices?  Mediterranean diet ? Recommended ? Grains: Whole-grain pasta. Brown rice. Bulgar wheat. Polenta. Couscous. Whole-wheat bread. [place]. ? Vegetables: Artichokes. Beets. Broccoli. Cabbage. Carrots. Eggplant. Green beans. Chard. Kale. Spinach. Onions. Leeks. Peas. Squash. Tomatoes. Peppers. Radishes. ? Fruits: Apples. Apricots. Avocado. Berries. Bananas. Cherries. Dates. Figs. Grapes. Lemons. Melon. Oranges. Peaches. Plums. Pomegranate. ? Meats and other protein foods: Beans. Almonds. Sunflower seeds. Pine nuts. Peanuts. [place]. Salmon. Scallops. Shrimp. [place]. Tilapia. Clams. Oysters. Eggs. ? Dairy: Low-fat milk. Cheese. Greek yogurt. ? Beverages: Water. Red wine. Herbal tea. ? Fats and oils: Extra virgin olive oil. Avocado oil. Grape seed oil. ? Sweets and desserts: [place] yogurt with honey. Baked apples. Poached pears. Trail mix. ? Seasoning and other foods: Basil. Cilantro. Coriander. Cumin. Mint. Parsley. Sage. Rosemary. Tarragon. Garlic. Oregano. Thyme. Pepper. Balsalmic vinegar. Tahini. Hummus. Tomato sauce. Olives. Mushrooms. ? Limit  these ? Grains: Prepackaged pasta or rice dishes. Prepackaged cereal with added sugar. ? Vegetables: Deep fried potatoes (french fries). ? Fruits: Fruit canned in syrup. ? Meats and other protein foods: Beef. Pork. Lamb. Poultry with skin. Hot dogs. Berniece Salines. ? Dairy: Ice cream. Sour cream. Whole milk. ? Beverages: Juice. Sugar-sweetened soft drinks. Beer. Liquor and spirits. ? Fats and oils: Butter. Canola oil. Vegetable oil. Beef fat (tallow). Lard. ? Sweets and desserts: Cookies. Cakes. Pies. Candy. ? Seasoning and other foods: Mayonnaise. Premade sauces and marinades. ? The items listed may not be a complete list. Talk with your dietitian about what dietary choices are right for you. Summary  The Mediterranean diet includes both food and lifestyle choices.  Eat a variety of fresh fruits and vegetables, beans, nuts, seeds, and whole grains.  Limit the amount of red meat and sweets that you eat.  Talk with your health care provider about whether it is safe for you to drink red wine in moderation. This means 1 glass a day for nonpregnant women and 2 glasses a day for men. A glass of wine equals 5 oz (150 mL). This information is not intended to replace advice given to you by your health care provider. Make sure you discuss any  questions you have with your health care provider. Document Released: 08/23/2015 Document Revised: 09/25/2015 Document Reviewed: 08/23/2015 Elsevier Interactive Patient Education  2019 [employer].

## 2018-02-19 ENCOUNTER — Telehealth: Payer: Self-pay | Admitting: Cardiovascular Disease

## 2018-02-19 NOTE — Telephone Encounter (Signed)
New Message        Patient is calling checking status of lab results

## 2018-02-19 NOTE — Telephone Encounter (Signed)
PT AWARE LABS ARE ABOUT THE SAME AS FROM 1 MONTH AGO APPEARS DR [facility] WILL DISCUSS FINDINGS AT OFFICE VISIT IN MAY .Adonis Housekeeper

## 2018-02-23 ENCOUNTER — Other Ambulatory Visit: Payer: Self-pay

## 2018-02-23 DIAGNOSIS — Z79899 Other long term (current) drug therapy: Secondary | ICD-10-CM

## 2018-06-01 ENCOUNTER — Telehealth: Payer: Self-pay | Admitting: Cardiovascular Disease

## 2018-06-02 ENCOUNTER — Telehealth (INDEPENDENT_AMBULATORY_CARE_PROVIDER_SITE_OTHER): Payer: Medicare Other | Admitting: Cardiovascular Disease

## 2018-06-02 DIAGNOSIS — I10 Essential (primary) hypertension: Secondary | ICD-10-CM | POA: Diagnosis not present

## 2018-06-02 DIAGNOSIS — E7800 Pure hypercholesterolemia, unspecified: Secondary | ICD-10-CM

## 2018-06-02 NOTE — Patient Instructions (Addendum)
Medication Instructions:  Your physician recommends that you continue on your current medications as directed. Please refer to the Current Medication list given to you today.  If you need a refill on your cardiac medications before your next appointment, please call your pharmacy.   Lab work: NONE  Testing/Procedures: NONE  Follow-Up: At [facility], you and your health needs are our priority.  As part of our continuing mission to provide you with exceptional heart care, we have created designated Provider Care Teams.  These Care Teams include your primary Cardiologist (physician) and Advanced Practice Providers (APPs -  Physician Assistants and Nurse Practitioners) who all work together to provide you with the care you need, when you need it. You will need a follow up appointment in 12 months.  Please call our office 2 months in advance to schedule this appointment.  You may see Skeet Latch, MD or one of the following Advanced Practice Providers on your designated Care Team:   Kerin Ransom, PA-C Roby Lofts, [place] . Sande Rives, PA-C  You have been referred to Referral to Healthy Weight and Wellness If you do not hear from the office by mid next week you can call them directly at [phone]   Any Other Special Instructions Will Be Listed Below (If Applicable).  Make small goals of going for a walk 10 min every day  Gradually increase

## 2018-06-02 NOTE — Progress Notes (Signed)
Virtual Visit via Telephone Note   This visit type was conducted due to national recommendations for restrictions regarding the COVID-19 Pandemic (e.g. social distancing) in an effort to limit this patient's exposure and mitigate transmission in our community.  Due to her co-morbid illnesses, this patient is at least at moderate risk for complications without adequate follow up.  This format is felt to be most appropriate for this patient at this time.  The patient did not have access to video technology/had technical difficulties with video requiring transitioning to audio format only (telephone).  All issues noted in this document were discussed and addressed.  No physical exam could be performed with this format.  Please refer to the patient's chart for her  consent to telehealth for [facility].   Date:  06/02/2018   ID:  Amber Huffman, DOB 05/13/1950, MRN [id number]  Patient Location: Home Provider Location: Home  PCP:  System, Pcp Not In  Cardiologist:  Skeet Latch, MD  Electrophysiologist:  None   Evaluation Performed:  Follow-Up Visit  Chief Complaint:  hyperlipidemia  History of Present Illness:    Amber Huffman is a 68 y.o. female with morbid obesity, hypertension and hyperlipidemia here for follow up.  She was initially seen in the hospital 10/2017 where she presented with chest pain.  She was diagnosed with hypertension and hyperlipidemia that admission.  She was started on hydrochlorothiazide.  Cardiac enzymes were negative and EKG was unremarkable.  Her chest pain was felt to be atypical and she was set up for outpatient stress testing.  She followed up with [name], DNP,  two weeks later.  She had not filled her medication as she is "not a medicine person."  She also rescheduled her testing.  She had an echo later that month that revealed LVEF 60 to 65% with grade 1 diastolic dysfunction.  She had a [employer] 11/2017 that revealed normal systolic  function and no ischemia.  Since that time she was started on atorvastatin but did not tolerate it due to myalgias.  She followed up with our pharmacist and they recommended starting rosuvastatin 5 mg twice a week but she hadn't started that at her last appointment.  Since her last appointment she hasn't been very active.  She has been home with COVID-19 and has not been exercising. She had just renewed her [employer] membership but hasn't been able to use it.  She has been mostly watching TV and not watching her diet.  She denies chest pain and her breathing has been stable.  She has no orthopnea or PND.   The patient does not have symptoms concerning for COVID-19 infection (fever, chills, cough, or new shortness of breath).    Past Medical History:  Diagnosis Date  . Morbid obesity with BMI of 50.0-59.9, adult [facility])    Past Surgical History:  Procedure Laterality Date  . ABDOMINAL HYSTERECTOMY       Current Meds  Medication Sig  . hydrochlorothiazide (MICROZIDE) 12.5 MG capsule Take 1 capsule (12.5 mg total) by mouth daily. (Patient taking differently: Take 25 mg by mouth daily. )  . ibuprofen (ADVIL,MOTRIN) 800 MG tablet Take 800 mg by mouth daily as needed (pain).  Marland Kitchen omeprazole (PRILOSEC) 20 MG capsule Take 1 capsule (20 mg total) by mouth daily.  Marland Kitchen OVER THE COUNTER MEDICATION Apply 1 application topically daily as needed (rash). OTC anti-fungal medication - cream  . Vitamin D, Ergocalciferol, (DRISDOL) 50000 units CAPS capsule Take 50,000 Units by mouth every  Thursday.     Allergies:   Penicillins   Social History   Tobacco Use  . Smoking status: Passive Smoke Exposure - Never Smoker  . Smokeless tobacco: Never Used  Substance Use Topics  . 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  . Drug use: Never     Family Hx: The patient's family history includes Heart  disease in her father.  ROS:   Please see the history of present illness.    All other systems reviewed and are negative.   Prior CV studies:   The following studies were reviewed today:  Echo 11/12/17: Study Conclusions  - Left ventricle: The cavity size was normal. There was moderate concentric hypertrophy. Systolic function was normal. The estimated ejection fraction was in the range of 60% to 65%. Wall motion was normal; there were no regional wall motion abnormalities. There was an increased relative contribution of atrial contraction to ventricular filling. Doppler parameters are consistent with abnormal left ventricular relaxation (grade 1 diastolic dysfunction). - Mitral valve: Calcified annulus. There was trivial regurgitation.  Lexiscan Myoview 11/13/17:  Nuclear stress EF: 81%.  The left ventricular ejection fraction is hyperdynamic (>65%).  There was no ST segment deviation noted during stress.  The study is normal.  This is a low risk study.  Normal resting and stress perfusion. No ischemia or infarction EF 81%  Labs/Other Tests and Data Reviewed:    EKG:  No ECG reviewed.  Recent Labs: 10/24/2017: B Natriuretic Peptide 14.8 10/25/2017: TSH 1.772 11/04/2017: Hemoglobin 15.1; Platelets 345 12/23/2017: ALT 17; BUN 18; Creatinine, Ser 0.73; Potassium 4.3; Sodium 141   Recent Lipid Panel Lab Results  Component Value Date/Time   CHOL 213 (H) 02/18/2018 10:20 AM   TRIG 89 02/18/2018 10:20 AM   HDL 43 02/18/2018 10:20 AM   CHOLHDL 5.0 (H) 02/18/2018 10:20 AM   CHOLHDL 5.1 10/25/2017 03:08 AM   LDLCALC 152 (H) 02/18/2018 10:20 AM    Wt Readings from Last 3 Encounters:  06/02/18 246 lb (111.6 kg)  02/18/18 247 lb 6.4 oz (112.2 kg)  11/16/17 251 lb 12.8 oz (114.2 kg)     Objective:    BP 123/82 Comment: Second reading from this morning.  Pulse 89   Ht 4\' 10"  (1.473 m)   Wt 246 lb (111.6 kg)   BMI 51.41 kg/m  GENERAL: Sounds  well.  No acute distress. NEURO:  Speech fluent.  PSYCH:  Cognitively intact, oriented to person place and time   ASSESSMENT & PLAN:    # Atypical chest pain: Resolved.  Lexiscan Myoview negative 10/2017.  # Hypertension: BP controlled on HCTZ.  No changes.   # Morbid obesity: # Hyperlipidemia: Ms. Stegmann previously tried atorvastatin and did not tolerate it.  She reports that she is not a medicine person and won't try any other cholesterol medication.  She will work on small goals to increase her exercise.  She will start with walking 10 min per day.  We will also refer her to the [facility] program.   COVID-19 Education: The signs and symptoms of COVID-19 were discussed with the patient and how to seek care for testing (follow up with PCP or arrange E-visit).  The importance of social distancing was discussed today.  Time:   Today, I have spent 15 minutes with the patient with telehealth technology discussing the  above problems.     Medication Adjustments/Labs and Tests Ordered: Current medicines are reviewed at length with the patient today.  Concerns regarding medicines are outlined above.   Tests Ordered: Orders Placed This Encounter  Procedures  . Ambulatory referral to [facility]    Medication Changes: No orders of the defined types were placed in this encounter.   Disposition:  Follow up in 1 year(s)  Signed, Skeet Latch, MD  06/02/2018 10:40 AM    [place]

## 2018-09-02 ENCOUNTER — Ambulatory Visit: Payer: Self-pay | Admitting: *Deleted

## 2018-09-02 ENCOUNTER — Telehealth: Payer: Self-pay | Admitting: Medical

## 2018-09-02 NOTE — Telephone Encounter (Signed)
Called pt as she was scheduled for 8/24 2:20pm appt with Mackie Pai requesting in office. This is an incorrect appt slot. NT did not contact office today after screening pt for SOB/cough/low grade fever.   I called pt and advised her to go to [facility] testing site as advised by practice admin. No order needed as pt is not currently established and office does not have ability for new pt appt 8/21.   Pt was provided address for testing site and encouraged to go 8/21 between 8am-3:30pm. She is set up for telephone visit 8/24 with [place]

## 2018-09-02 NOTE — Telephone Encounter (Signed)
Pt called with shortness of breath and cough. She went to the [facility] in [place] on the 7 th until the 10 th of August. She developed a low grad fever on Monday the 17, one week later. Had body aches and finally went away. Today she has the cough and shortness of breath.  She has had shortness of breath before and was prescribed an inhaler, years ago. She used that and stated it helped. Also found tessalon pearls that she took. Advised against taking medications that she had years ago. She voiced understanding.  She denies lung diseases, but sees a cardiologist.  Denies swelling in her extremities.  Advised that she should take the covid-19 test and if increase in shortness of breath to go to the ED or call 911. She has a new patient appointment scheduled at [facility] on Monday.   Reason for Disposition . [1] MILD difficulty breathing (e.g., minimal/no SOB at rest, SOB with walking, pulse <100) AND [2] NEW-onset or WORSE than normal  Answer Assessment - Initial Assessment Questions 1. RESPIRATORY STATUS: "Describe your breathing?" (e.g., wheezing, shortness of breath, unable to speak, severe coughing)      Shortness of breath 2. ONSET: "When did this breathing problem begin?"      today 3. PATTERN "Does the difficult breathing come and go, or has it been constant since it started?"      Comes and goes 4. SEVERITY: "How bad is your breathing?" (e.g., mild, moderate, severe)    - MILD: No SOB at rest, mild SOB with walking, speaks normally in sentences, can lay down, no retractions, pulse < 100.    - MODERATE: SOB at rest, SOB with minimal exertion and prefers to sit, cannot lie down flat, speaks in phrases, mild retractions, audible wheezing, pulse 100-120.    - SEVERE: Very SOB at rest, speaks in single words, struggling to breathe, sitting hunched forward, retractions, pulse > 120      mild 5. RECURRENT SYMPTOM: "Have you had difficulty breathing before?" If so, ask: "When was  the last time?" and "What happened that time?"      Yes years ago and was given an inhaler 6. CARDIAC HISTORY: "Do you have any history of heart disease?" (e.g., heart attack, angina, bypass surgery, angioplasty)      Yes, sees a heart doctor 7. LUNG HISTORY: "Do you have any history of lung disease?"  (e.g., pulmonary embolus, asthma, emphysema)     no 8. CAUSE: "What do you think is causing the breathing problem?"      Not sure what's causing this 9. OTHER SYMPTOMS: "Do you have any other symptoms? (e.g., dizziness, runny nose, cough, chest pain, fever)     Cough, had fever on Monday 10. PREGNANCY: "Is there any chance you are pregnant?" "When was your last menstrual period?"       n/a 11. TRAVEL: "Have you traveled out of the country in the last month?" (e.g., travel history, exposures)       Have not traveled outside [place]  Protocols used: BREATHING DIFFICULTY-A-AH

## 2018-09-03 ENCOUNTER — Other Ambulatory Visit: Payer: Self-pay

## 2018-09-03 DIAGNOSIS — Z20822 Contact with and (suspected) exposure to covid-19: Secondary | ICD-10-CM

## 2018-09-04 LAB — NOVEL CORONAVIRUS, NAA: SARS-CoV-2, NAA: NOT DETECTED

## 2018-09-06 ENCOUNTER — Encounter: Payer: Self-pay | Admitting: Medical

## 2018-09-06 ENCOUNTER — Ambulatory Visit (INDEPENDENT_AMBULATORY_CARE_PROVIDER_SITE_OTHER): Payer: Medicare Other | Admitting: Medical

## 2018-09-06 ENCOUNTER — Other Ambulatory Visit: Payer: Self-pay

## 2018-09-06 VITALS — BP 136/79 | HR 85 | Ht 58.5 in | Wt 237.2 lb

## 2018-09-06 DIAGNOSIS — R05 Cough: Secondary | ICD-10-CM | POA: Diagnosis not present

## 2018-09-06 DIAGNOSIS — M79645 Pain in left finger(s): Secondary | ICD-10-CM | POA: Diagnosis not present

## 2018-09-06 DIAGNOSIS — R0600 Dyspnea, unspecified: Secondary | ICD-10-CM | POA: Diagnosis not present

## 2018-09-06 DIAGNOSIS — I10 Essential (primary) hypertension: Secondary | ICD-10-CM | POA: Diagnosis not present

## 2018-09-06 DIAGNOSIS — R059 Cough, unspecified: Secondary | ICD-10-CM

## 2018-09-06 MED ORDER — ALBUTEROL SULFATE HFA 108 (90 BASE) MCG/ACT IN AERS: 2.0000 | INHALATION_SPRAY | Freq: Four times a day (QID) | RESPIRATORY_TRACT | 0 refills | Status: DC | PRN

## 2018-09-06 NOTE — Progress Notes (Addendum)
Subjective:    Patient ID: Amber Huffman, female    DOB: April 02, 1950, 68 y.o.   MRN: [id number]  HPI  Virtual Visit via Telephone Note  I connected with Hortencia Pilar on 09/06/18 at  2:20 PM EDT by telephone and verified that I am speaking with the correct person using two identifiers.  Location: Patient: home Provider: office.   I discussed the limitations, risks, security and privacy concerns of performing an evaluation and management service by telephone and the availability of in person appointments. I also discussed with the patient that there may be a patient responsible charge related to this service. The patient expressed understanding and agreed to proceed.   History of Present Illness:  Former pcp was in [place]. She lives now in [place].   Pt has htn history. She is on hctz only.  Hx of low vitamin D.   Pt has recent thumb pain.   Recent she had brief 24 hour of fever. She states midnight 08/30/2018. She had severe fatigue and fever. She states Tuesday and wed felt fine. Last Thursday had constant cough all day and some shortness of breath. Her cough is much less and not feeling severe sob as wheeze. Pt does not when [place] need to borrow sisters inhaler((around august 8). Pt does not exercise.  On 09/03/2018 tested for covid and result came back negative.    Pt states Saturday had 3 dark black tarry stools. Pt did not take any iron or pepto bismol. No black stools since then. Pt did not have abdomen pain. Mild soft stool. Bp 127/80.   Pt admits not exercising, poor diet, has pet cat.    Observations/Objective:  General- no acute distress. Pleasant. Oriented. Normal speech. Random rare cough.   Assessment and Plan: For recent cough, fever for 24 hour, and some brief sob, will need to watch pt closely. She feels much better now with just rare cough. Use benzonatate if needed. Pt declined zpack today but will get cxr tomorrow. Refilled  her albuterol to use if needed.   Covid test negative and may repeat if clinically worsening.  If clinically worsening would offer zpack again and pt agreed would start if chest congestion or productive cough.  Htn controlled. Continue hctz.  One event dark black stools. None since and bp stable. Not fatigued. On office will get cbc. Order hemocult cards. If stools black again move up her appointment.   Follow up sept 4 early am or as needed  Mackie Pai, PA-C  Follow Up Instructions:    I discussed the assessment and treatment plan with the patient. The patient was provided an opportunity to ask questions and all were answered. The patient agreed with the plan and demonstrated an understanding of the instructions.   The patient was advised to call back or seek an in-person evaluation if the symptoms worsen or if the condition fails to improve as anticipated.  I provided  30 minutes of non-face-to-face time during this encounter.   Mackie Pai, PA-C    Review of Systems  Constitutional: Negative for chills, fatigue and fever.  HENT: Negative for congestion and ear discharge.   Respiratory: Positive for cough and shortness of breath. Negative for chest tightness and wheezing.        Sob much better  Cardiovascular: Negative for chest pain and palpitations.  Gastrointestinal: Negative for abdominal pain.       Black stool only one time recently.  Endocrine: Negative for polydipsia,  polyphagia and polyuria.  Genitourinary: Negative for dyspareunia, dysuria, frequency, pelvic pain and urgency.  Musculoskeletal: Negative for back pain.  Neurological: Negative for dizziness, light-headedness and headaches.  Hematological: Negative for adenopathy. Does not bruise/bleed easily.    Past Medical History:  Diagnosis Date  . Hyperlipidemia   . Hypertension   . Morbid obesity with BMI of 50.0-59.9, adult [facility])      Social History   Socioeconomic History  . Marital  status: Married    Spouse name: Not on file  . Number of children: Not on file  . Years of education: Not on file  . Highest education level: Not on file  Occupational History  . Not on file  Social Needs  . Financial resource strain: Not hard at all  . Food insecurity    Worry: Never true    Inability: Never true  . Transportation needs    Medical: No    Non-medical: No  Tobacco Use  . Smoking status: Passive Smoke Exposure - Never Smoker  . Smokeless tobacco: Never Used  Substance and Sexual Activity  . 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  . Drug use: Never  . Sexual activity: Not on file  Lifestyle  . Physical activity    Days per week: 0 days    Minutes per session: 0 min  . Stress: Not at all  Relationships  . Social connections    Talks on phone: More than three times a week    Gets together: More than three times a week    Attends religious service: 1 to 4 times per year    Active member of club or organization: No    Attends meetings of clubs or organizations: Never    Relationship status: Married  . Intimate partner violence    Fear of current or ex partner: No    Emotionally abused: No    Physically abused: No    Forced sexual activity: No  Other Topics Concern  . Not on file  Social History Narrative  . Not on file    Past Surgical History:  Procedure Laterality Date  . ABDOMINAL HYSTERECTOMY      Family History  Problem Relation Age of Onset  . Heart disease Father        After the age of 60    Allergies  Allergen Reactions  . Penicillins Rash    Has patient had a PCN reaction causing immediate rash, facial/tongue/throat swelling, SOB or lightheadedness with hypotension: Yes Has patient had a PCN reaction causing severe rash involving mucus membranes or skin necrosis: No Has patient had a PCN reaction that required  hospitalization: No Has patient had a PCN reaction occurring within the last 10 years: No If all of the above answers are "NO", then may proceed with Cephalosporin use.    Current Outpatient Medications on File Prior to Visit  Medication Sig Dispense Refill  . hydrochlorothiazide (MICROZIDE) 12.5 MG capsule Take 1 capsule (12.5 mg total) by mouth daily. (Patient taking differently: Take 25 mg by mouth daily. ) 30 capsule 1  . ibuprofen (ADVIL,MOTRIN) 800 MG tablet Take 800 mg by mouth daily as needed (pain).    Marland Kitchen OVER THE COUNTER MEDICATION Apply 1 application topically daily as needed (rash). OTC anti-fungal medication - cream    . Vitamin D, Ergocalciferol, (DRISDOL) 50000 units CAPS  capsule Take 50,000 Units by mouth every Thursday.     No current facility-administered medications on file prior to visit.     BP 136/79   Pulse 85   Ht 4' 10.5" (1.486 m)   Wt 237 lb 3.2 oz (107.6 kg)   BMI 48.73 kg/m       Objective:   Physical Exam        Assessment & Plan:

## 2018-09-06 NOTE — Telephone Encounter (Signed)
PEC initially scheduled this new pt appt with Percell Dusing. Pt requesting TOC to Debbrah Alar as PCP. Please advise if ok.

## 2018-09-06 NOTE — Patient Instructions (Addendum)
For recent cough, fever for 24 hour, and some brief sob, will need to watch pt closely. She feels much better now with just rare cough. Use benzonatate if needed. Pt declined zpack today but will get cxr tomorrow. Refilled her albuterol to use if needed.   Covid test negative and may repeat if clinically worsening.  If clinically worsening would offer zpack again and pt agreed would start if chest congestion or productive cough.  Htn controlled. Continue hctz.  One event dark black stools. None since and bp stable. Not fatigued. On office will get cbc. Order hemocult cards. If stools black again move up her appointment.   Follow up sept 4 early am or as needed

## 2018-09-06 NOTE — Telephone Encounter (Signed)
Pt called 20 min prior to her appt to ask if she has this appt with Percell Donna, would she have to stay with him?  She states she would really like to establish with Debbrah Alar.  Advised pt I would send a message and ask Lenna Sciara if she will make an exception, and to note she will connect with Percell Carby today for her issues.

## 2018-09-06 NOTE — Telephone Encounter (Signed)
OK with me.

## 2018-09-06 NOTE — Telephone Encounter (Signed)
Would you call pt and ask directly since she mentioned to me intent was to switch to [facility] originally then states she changed her mind and wanted me to her pcp.  So under these circumstance would you call her and let her know I'd be happy to be her pcp or Melissa could be also. Melissa ok'd if that is what she wants.

## 2018-09-07 ENCOUNTER — Other Ambulatory Visit: Payer: Self-pay

## 2018-09-07 ENCOUNTER — Ambulatory Visit (HOSPITAL_BASED_OUTPATIENT_CLINIC_OR_DEPARTMENT_OTHER)
Admission: RE | Admit: 2018-09-07 | Discharge: 2018-09-07 | Disposition: A | Discharge status: Home / Self Care | Payer: Medicare Other | Source: Ambulatory Visit | Attending: Medical | Admitting: Medical

## 2018-09-07 DIAGNOSIS — R0600 Dyspnea, unspecified: Secondary | ICD-10-CM

## 2018-09-07 DIAGNOSIS — R059 Cough, unspecified: Secondary | ICD-10-CM

## 2018-09-07 DIAGNOSIS — R05 Cough: Secondary | ICD-10-CM | POA: Insufficient documentation

## 2018-09-08 ENCOUNTER — Telehealth: Payer: Self-pay | Admitting: *Deleted

## 2018-09-08 NOTE — Telephone Encounter (Signed)
Copied from [place] [id number]. Topic: General - Other >> Sep 08, 2018 11:32 AM Amber Huffman wrote: Reason for CRM: Pt called requesting her x ray results. Please advise.

## 2018-09-08 NOTE — Telephone Encounter (Signed)
Patient notified of x-ray results.

## 2018-09-08 NOTE — Telephone Encounter (Signed)
LM for pt to call and schedule f/u appt 9/4 early am (as requested by Percell Hellinger) & to see if she is going to stay with him as PCP or change to [facility].

## 2018-09-17 ENCOUNTER — Ambulatory Visit (INDEPENDENT_AMBULATORY_CARE_PROVIDER_SITE_OTHER): Payer: Medicare Other | Admitting: Medical

## 2018-09-17 ENCOUNTER — Encounter: Payer: Self-pay | Admitting: Medical

## 2018-09-17 ENCOUNTER — Other Ambulatory Visit: Payer: Self-pay

## 2018-09-17 VITALS — BP 117/85 | HR 91 | Wt 234.6 lb

## 2018-09-17 DIAGNOSIS — Z1239 Encounter for other screening for malignant neoplasm of breast: Secondary | ICD-10-CM | POA: Diagnosis not present

## 2018-09-17 DIAGNOSIS — R5383 Other fatigue: Secondary | ICD-10-CM

## 2018-09-17 DIAGNOSIS — I10 Essential (primary) hypertension: Secondary | ICD-10-CM

## 2018-09-17 MED ORDER — HYDROCHLOROTHIAZIDE 25 MG PO TABS: 25.0000 mg | ORAL_TABLET | Freq: Every day | ORAL | 3 refills | Status: DC

## 2018-09-17 NOTE — Progress Notes (Signed)
Subjective:    Patient ID: Amber Huffman, female    DOB: September 18, 1950, 68 y.o.   MRN: [id number]  HPI  Virtual Visit via Telephone Note  I connected with Amber Huffman on 09/17/18 at  1:00 PM EDT by telephone and verified that I am speaking with the correct person using two identifiers.  Location: Patient: home Provider: office.   I discussed the limitations, risks, security and privacy concerns of performing an evaluation and management service by telephone and the availability of in person appointments. I also discussed with the patient that there may be a patient responsible charge related to this service. The patient expressed understanding and agreed to proceed.   History of Present Illness: Pt states cough went away and fever only lasted one day after I saw her. She did not require antibiotic.   Pt covid test was negative as last note explained.  Doing well and asymptomatic.  BP was well controlled. She needs hctz 25 mg daily.  Pt declines flu vaccine.     Observations/Objective: General- no acute distress. Pleasant oriented. Oriented. Normal speech.  Assessment and Plan: I am glad that your symptoms resolved and that you did not need antibiotic.  Illness might have been upper respiratory infection but not COVID since test was negative.  Your blood pressure is well controlled today on second BP recheck.  Did go ahead and prescribe former medication HCTZ 25mg .  Placed future labs to be done over the next 1 to 2 weeks.  Asked you to call our office to get scheduled.  Placed future order screening mammogram.  Epic brought up issue regarding get coverage for her mammogram.  I explained to patient that I ordered it in 3D with the screening for breast cancer diagnosis.  We had a conversation about insurance coverage and I asked her to call her insurance and make sure they pay for 3D mammogram diagnosis screening for breast cancer.  She expressed that she would call them and also  get old mammogram reports.  Patient is going to reconsider getting flu vaccine.  States has not gotten flu vaccine for years.  Counseled regarding benefit.  Follow-up in 4 months or as needed.  25 minutes spent with patient today.  50% of time was spent counseling patient diagnosis and treatment.  Discussion on coverage for screening mammograms.  Answered patient questions.  Mackie Pai, PA-C  Follow Up Instructions:    I discussed the assessment and treatment plan with the patient. The patient was provided an opportunity to ask questions and all were answered. The patient agreed with the plan and demonstrated an understanding of the instructions.   The patient was advised to call back or seek an in-person evaluation if the symptoms worsen or if the condition fails to improve as anticipated.  I provided 25 minutes of non-face-to-face time during this encounter.   Mackie Pai, PA-C   Review of Systems  Constitutional: Negative for chills, fatigue and fever.  Respiratory: Negative for cough, chest tightness and wheezing.   Cardiovascular: Negative for chest pain and palpitations.  Gastrointestinal: Negative for abdominal pain and blood in stool.  Musculoskeletal: Negative for back pain, joint swelling and neck pain.  Skin: Negative for rash.  Hematological: Negative for adenopathy. Does not bruise/bleed easily.  Psychiatric/Behavioral: Negative for behavioral problems and sleep disturbance. The patient is not nervous/anxious.     Past Medical History:  Diagnosis Date  . Hyperlipidemia   . Hypertension   . Morbid obesity with BMI of  50.0-59.9, adult [facility])      Social History   Socioeconomic History  . Marital status: Married    Spouse name: Not on file  . Number of children: Not on file  . Years of education: Not on file  . Highest education level: Not on file  Occupational History  . Not on file  Social Needs  . Financial resource strain: Not hard at all  .  Food insecurity    Worry: Never true    Inability: Never true  . Transportation needs    Medical: No    Non-medical: No  Tobacco Use  . Smoking status: Passive Smoke Exposure - Never Smoker  . Smokeless tobacco: Never Used  Substance and Sexual Activity  . 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  . Drug use: Never  . Sexual activity: Not on file  Lifestyle  . Physical activity    Days per week: 0 days    Minutes per session: 0 min  . Stress: Not at all  Relationships  . Social connections    Talks on phone: More than three times a week    Gets together: More than three times a week    Attends religious service: 1 to 4 times per year    Active member of club or organization: No    Attends meetings of clubs or organizations: Never    Relationship status: Married  . Intimate partner violence    Fear of current or ex partner: No    Emotionally abused: No    Physically abused: No    Forced sexual activity: No  Other Topics Concern  . Not on file  Social History Narrative  . Not on file    Past Surgical History:  Procedure Laterality Date  . ABDOMINAL HYSTERECTOMY      Family History  Problem Relation Age of Onset  . Heart disease Father        After the age of 22    Allergies  Allergen Reactions  . Penicillins Rash    Has patient had a PCN reaction causing immediate rash, facial/tongue/throat swelling, SOB or lightheadedness with hypotension: Yes Has patient had a PCN reaction causing severe rash involving mucus membranes or skin necrosis: No Has patient had a PCN reaction that required hospitalization: No Has patient had a PCN reaction occurring within the last 10 years: No If all of the above answers are "NO", then may proceed with Cephalosporin use.    Current Outpatient Medications on File Prior to Visit  Medication Sig Dispense Refill   . albuterol (VENTOLIN HFA) 108 (90 Base) MCG/ACT inhaler Inhale 2 puffs into the lungs every 6 (six) hours as needed for wheezing or shortness of breath. 18 g 0  . ibuprofen (ADVIL,MOTRIN) 800 MG tablet Take 800 mg by mouth daily as needed (pain).    Marland Kitchen OVER THE COUNTER MEDICATION Apply 1 application topically daily as needed (rash). OTC anti-fungal medication - cream    . Vitamin D, Ergocalciferol, (DRISDOL) 50000 units CAPS capsule Take 50,000 Units by mouth every Thursday.     No current facility-administered medications on file prior to visit.     BP 117/85   Pulse 91   Wt 234 lb 9.6 oz (106.4 kg)   BMI 48.20 kg/m       Objective:   Physical Exam  Assessment & Plan:

## 2018-09-17 NOTE — Patient Instructions (Addendum)
I am glad that your symptoms resolved and that you did not need antibiotic.  Illness might have been upper respiratory infection but not COVID since test was negative.  Your blood pressure is well controlled today on second BP recheck.  Did go ahead and prescribe former medication HCTZ 25mg .  Placed future labs to be done over the next 1 to 2 weeks.  Asked you to call our office to get scheduled.  Placed future order screening mammogram.  Epic brought up issue regarding get coverage for her mammogram.  I explained to patient that I ordered it in 3D with the screening for breast cancer diagnosis.  We had a conversation about insurance coverage and I asked her to call her insurance and make sure they pay for 3D mammogram diagnosis screening for breast cancer.  She expressed that she would call them and also get old mammogram reports.  Patient is going to reconsider getting flu vaccine.  States has not gotten flu vaccine for years.  Counseled regarding benefit.  Follow-up in 4 months or as needed.

## 2018-09-23 ENCOUNTER — Telehealth: Payer: Self-pay

## 2018-09-23 NOTE — Telephone Encounter (Signed)
Copied from [place] [id number]. Topic: General - Other >> Sep 23, 2018  1:58 PM Antonieta Iba C wrote: Reason for CRM: pt says that her father had abdominal aneurism. Pt says that she was told that 90% of relatives should be tested for it because it is hereditary. Pt would like to be tested for this as well.   Pt would like a call back for confirmation.

## 2018-09-25 ENCOUNTER — Telehealth: Payer: Self-pay | Admitting: Medical

## 2018-09-25 DIAGNOSIS — Z136 Encounter for screening for cardiovascular disorders: Secondary | ICD-10-CM

## 2018-09-25 DIAGNOSIS — Z8249 Family history of ischemic heart disease and other diseases of the circulatory system: Secondary | ICD-10-CM

## 2018-09-25 NOTE — Telephone Encounter (Signed)
I put in order for screening abdomen US for AAA. If you can give pt number to radiology dept to get test done. I don't think needs prior auth but sent Gwenn a note in event that is required.  Will you ask pt if her dad has dissecting aneurysm? Or just know history of AAA.

## 2018-09-25 NOTE — Telephone Encounter (Signed)
I placed future order [place] abd reason was screening abdomen aortic aneurysm. famliy hx of AAA. Does it need prior auth?

## 2018-09-27 NOTE — Telephone Encounter (Signed)
Gave pt number to imaging. Pt stats her dad aneurysm did not dissect he had a successful repair in right leg.

## 2018-09-28 ENCOUNTER — Ambulatory Visit (HOSPITAL_BASED_OUTPATIENT_CLINIC_OR_DEPARTMENT_OTHER): Payer: Medicare Other

## 2018-10-04 ENCOUNTER — Other Ambulatory Visit: Payer: Self-pay | Admitting: Medical

## 2018-10-04 ENCOUNTER — Other Ambulatory Visit (INDEPENDENT_AMBULATORY_CARE_PROVIDER_SITE_OTHER): Payer: Medicare Other

## 2018-10-04 ENCOUNTER — Ambulatory Visit (HOSPITAL_BASED_OUTPATIENT_CLINIC_OR_DEPARTMENT_OTHER)
Admission: RE | Admit: 2018-10-04 | Discharge: 2018-10-04 | Disposition: A | Discharge status: Home / Self Care | Payer: Medicare Other | Source: Ambulatory Visit | Attending: Medical | Admitting: Medical

## 2018-10-04 ENCOUNTER — Other Ambulatory Visit: Payer: Self-pay

## 2018-10-04 DIAGNOSIS — Z8249 Family history of ischemic heart disease and other diseases of the circulatory system: Secondary | ICD-10-CM

## 2018-10-04 DIAGNOSIS — I10 Essential (primary) hypertension: Secondary | ICD-10-CM

## 2018-10-04 DIAGNOSIS — Z136 Encounter for screening for cardiovascular disorders: Secondary | ICD-10-CM

## 2018-10-04 DIAGNOSIS — R5383 Other fatigue: Secondary | ICD-10-CM | POA: Diagnosis not present

## 2018-10-04 LAB — COMPREHENSIVE METABOLIC PANEL
ALT: 16 U/L (ref 0–35)
AST: 16 U/L (ref 0–37)
Albumin: 3.9 g/dL (ref 3.5–5.2)
Alkaline Phosphatase: 79 U/L (ref 39–117)
BUN: 12 mg/dL (ref 6–23)
CO2: 34 mEq/L — ABNORMAL HIGH (ref 19–32)
Calcium: 9.6 mg/dL (ref 8.4–10.5)
Chloride: 97 mEq/L (ref 96–112)
Creatinine, Ser: 0.71 mg/dL (ref 0.40–1.20)
GFR: 81.22 mL/min (ref >60.00)
Glucose, Bld: 122 mg/dL — ABNORMAL HIGH (ref 70–99)
Potassium: 4.5 mEq/L (ref 3.5–5.1)
Sodium: 139 mEq/L (ref 135–145)
Total Bilirubin: 0.4 mg/dL (ref 0.2–1.2)
Total Protein: 6.6 g/dL (ref 6.0–8.3)

## 2018-10-04 LAB — LIPID PANEL
Cholesterol: 216 mg/dL — ABNORMAL HIGH (ref 0–200)
HDL: 38.2 mg/dL — ABNORMAL LOW (ref >39.00)
LDL Cholesterol: 148 mg/dL — ABNORMAL HIGH (ref 0–99)
NonHDL: 177.97
Total CHOL/HDL Ratio: 6
Triglycerides: 148 mg/dL (ref 0.0–149.0)
VLDL: 29.6 mg/dL (ref 0.0–40.0)

## 2018-10-04 LAB — TSH: TSH: 2.67 u[IU]/mL (ref 0.35–4.50)

## 2018-10-05 ENCOUNTER — Telehealth: Payer: Self-pay | Admitting: Medical

## 2018-10-05 ENCOUNTER — Telehealth (INDEPENDENT_AMBULATORY_CARE_PROVIDER_SITE_OTHER): Payer: Medicare Other | Admitting: Medical

## 2018-10-05 DIAGNOSIS — R739 Hyperglycemia, unspecified: Secondary | ICD-10-CM

## 2018-10-05 DIAGNOSIS — E785 Hyperlipidemia, unspecified: Secondary | ICD-10-CM | POA: Diagnosis not present

## 2018-10-05 MED ORDER — ATORVASTATIN CALCIUM 10 MG PO TABS: 10.0000 mg | ORAL_TABLET | Freq: Every day | ORAL | 3 refills | Status: DC

## 2018-10-05 NOTE — Progress Notes (Signed)
hpi- Pt did not check her bp or pulse today.   She called to review labs. She has high cholesterol and I went over ascvd risk score.  She does not want to be on any statin since she had diffuse severe myalgias when she tried that previously in the past.  She is willing to consider Zetia but not sure.  I did mention krill oil or fish oil but did explain is not the best line of treatment.  Also did review her mild elevated sugars in the past and explained that if she is diabetic that would affect the cardiovascular risk for.  I placed A1c order earlier today and explained her she can get scheduled in the near future for that A1c.  She wants to get scheduled in November when she does her mammogram.  She had concern for AAA since her dad had history of that.  Recent ultrasound showed no evidence of AAA.  Ros-negative on review.  Vital signs- patient did not take her vitals today.   Exam-General-no acute distress, pleasant, oriented and normal speech.  A/P  For history of hyperlipidemia,I  reviewed ASCVD score and offered prescription of Zetia since she reports intolerance to statins.  Will send prescription of Zetia to her pharmacy and she will decide if she wants to start that.  She also do low-cholesterol diet.  For elevated blood sugar, placed future A1c.  She will get that done when she gets her mammogram.  Instructed her to call and get scheduled for that.  Follow-up date to be determined after lab review.  15 minutes spent with patient today.  50% of time spent counseling patient on plan going forward.

## 2018-10-05 NOTE — Telephone Encounter (Signed)
Prescription of atorvastatin sent to patient's pharmacy. 

## 2018-10-05 NOTE — Patient Instructions (Signed)
For history of hyperlipidemia,I  reviewed ASCVD score and offered prescription of Zetia since she reports intolerance to statins.  Will send prescription of Zetia to her pharmacy and she will decide if she wants to start that.  She also do low-cholesterol diet.  For elevated blood sugar, placed future A1c.  She will get that done when she gets her mammogram.  Instructed her to call and get scheduled for that.  Follow-up date to be determined after lab review.

## 2018-11-22 ENCOUNTER — Other Ambulatory Visit: Payer: Self-pay

## 2018-11-22 ENCOUNTER — Telehealth: Payer: Self-pay | Admitting: Medical

## 2018-11-22 ENCOUNTER — Other Ambulatory Visit (INDEPENDENT_AMBULATORY_CARE_PROVIDER_SITE_OTHER): Payer: Medicare Other

## 2018-11-22 ENCOUNTER — Encounter (HOSPITAL_BASED_OUTPATIENT_CLINIC_OR_DEPARTMENT_OTHER): Payer: Self-pay

## 2018-11-22 ENCOUNTER — Ambulatory Visit (HOSPITAL_BASED_OUTPATIENT_CLINIC_OR_DEPARTMENT_OTHER)
Admission: RE | Admit: 2018-11-22 | Discharge: 2018-11-22 | Disposition: A | Discharge status: Home / Self Care | Payer: Medicare Other | Source: Ambulatory Visit | Attending: Medical | Admitting: Medical

## 2018-11-22 DIAGNOSIS — Z1239 Encounter for other screening for malignant neoplasm of breast: Secondary | ICD-10-CM

## 2018-11-22 DIAGNOSIS — Z1231 Encounter for screening mammogram for malignant neoplasm of breast: Secondary | ICD-10-CM | POA: Insufficient documentation

## 2018-11-22 DIAGNOSIS — R739 Hyperglycemia, unspecified: Secondary | ICD-10-CM

## 2018-11-22 DIAGNOSIS — Z79899 Other long term (current) drug therapy: Secondary | ICD-10-CM

## 2018-11-22 LAB — HEMOGLOBIN A1C: Hgb A1c MFr Bld: 6.6 % — ABNORMAL HIGH (ref 4.6–6.5)

## 2018-11-22 MED ORDER — METFORMIN HCL 500 MG PO TABS: 500.0000 mg | ORAL_TABLET | Freq: Every day | ORAL | 3 refills | Status: DC

## 2018-11-22 NOTE — Addendum Note (Signed)
Addended by: Kelle Darting A on: 11/22/2018 09:57 AM   Modules accepted: Orders

## 2018-11-22 NOTE — Telephone Encounter (Signed)
Rx metformin sent to pt pharmacy. 

## 2018-11-23 ENCOUNTER — Telehealth: Payer: Self-pay

## 2018-11-23 NOTE — Telephone Encounter (Signed)
Copied from [place] [id number]. Topic: General - Other >> Nov 23, 2018 12:20 PM Leward Quan A wrote: Reason for CRM: Patient would like a call bake to discuss the results of her A1C. Can be reached at Ph#  [phone]

## 2018-11-23 NOTE — Telephone Encounter (Signed)
Spoke with pt about results. Noted In result notes

## 2018-12-07 ENCOUNTER — Other Ambulatory Visit: Payer: Self-pay | Admitting: Medical

## 2018-12-07 NOTE — Telephone Encounter (Signed)
Copied from [place] [id number]. Topic: Quick Communication - Rx Refill/Question >> Dec 07, 2018  4:45 PM Yvette Rack wrote: Medication: Vitamin D, Ergocalciferol, (DRISDOL) 50000 units CAPS capsule  Has the patient contacted their pharmacy? no  Preferred Pharmacy (with phone number or street name): [place], [place] [phone] (Phone) [phone] (Fax)  Agent: Please be advised that RX refills may take up to 3 business days. We ask that you follow-up with your pharmacy.

## 2018-12-08 MED ORDER — VITAMIN D (ERGOCALCIFEROL) 1.25 MG (50000 UNIT) PO CAPS: 50000.0000 [IU] | ORAL_CAPSULE | ORAL | 5 refills | Status: DC

## 2018-12-08 NOTE — Telephone Encounter (Signed)
Requested medication (s) are due for refill today: no  Requested medication (s) are on the active medication list: yes  Last refill:  10/25/2017  Future visit scheduled: no  Notes to clinic:  Refill cannot be delegated    Requested Prescriptions  Pending Prescriptions Disp Refills   Vitamin D, Ergocalciferol, (DRISDOL) 1.25 MG (50000 UT) CAPS capsule 5 capsule     Sig: Take 1 capsule (50,000 Units total) by mouth every Thursday.     Endocrinology:  Vitamins - Vitamin D Supplementation Failed - 12/07/2018  5:37 PM      Failed - 50,000 IU strengths are not delegated      Failed - Phosphate in normal range and within 360 days    No results found for: PHOS       Failed - Vitamin D in normal range and within 360 days    No results found for: [id number], [id number], [id number], 25OHVITD3, 25OHVITD2, 25OHVITD3, 25OHVITD2, 25OHVITD1, 25OHVITD2, 25OHVITD3, VD25OH       Passed - Ca in normal range and within 360 days    Calcium  Date Value Ref Range Status  10/04/2018 9.6 8.4 - 10.5 mg/dL Final         Passed - Valid encounter within last 12 months    Recent Outpatient Visits          2 months ago Hyperlipidemia, unspecified hyperlipidemia type   [facility] at [place], [place]   2 months ago Essential hypertension   [facility] at [place], [place]   3 months ago Cough   [facility] at [place], [place]

## 2019-02-18 ENCOUNTER — Telehealth: Payer: Self-pay | Admitting: Medical

## 2019-02-18 NOTE — Telephone Encounter (Signed)
error 

## 2019-02-22 ENCOUNTER — Other Ambulatory Visit: Payer: Self-pay

## 2019-02-23 ENCOUNTER — Ambulatory Visit (HOSPITAL_BASED_OUTPATIENT_CLINIC_OR_DEPARTMENT_OTHER)
Admission: RE | Admit: 2019-02-23 | Discharge: 2019-02-23 | Disposition: A | Discharge status: Home / Self Care | Payer: Medicare Other | Source: Ambulatory Visit | Attending: Medical | Admitting: Medical

## 2019-02-23 ENCOUNTER — Ambulatory Visit (INDEPENDENT_AMBULATORY_CARE_PROVIDER_SITE_OTHER): Payer: Medicare Other | Admitting: Medical

## 2019-02-23 ENCOUNTER — Other Ambulatory Visit: Payer: Medicare Other

## 2019-02-23 VITALS — BP 112/80 | HR 94 | Wt 223.8 lb

## 2019-02-23 DIAGNOSIS — R05 Cough: Secondary | ICD-10-CM

## 2019-02-23 DIAGNOSIS — Z7722 Contact with and (suspected) exposure to environmental tobacco smoke (acute) (chronic): Secondary | ICD-10-CM

## 2019-02-23 DIAGNOSIS — R0600 Dyspnea, unspecified: Secondary | ICD-10-CM | POA: Diagnosis not present

## 2019-02-23 DIAGNOSIS — R5383 Other fatigue: Secondary | ICD-10-CM

## 2019-02-23 DIAGNOSIS — R739 Hyperglycemia, unspecified: Secondary | ICD-10-CM | POA: Diagnosis not present

## 2019-02-23 DIAGNOSIS — R7989 Other specified abnormal findings of blood chemistry: Secondary | ICD-10-CM

## 2019-02-23 DIAGNOSIS — R059 Cough, unspecified: Secondary | ICD-10-CM

## 2019-02-23 DIAGNOSIS — E119 Type 2 diabetes mellitus without complications: Secondary | ICD-10-CM

## 2019-02-23 DIAGNOSIS — E785 Hyperlipidemia, unspecified: Secondary | ICD-10-CM | POA: Diagnosis not present

## 2019-02-23 LAB — COMPREHENSIVE METABOLIC PANEL
ALT: 14 U/L (ref 0–35)
AST: 17 U/L (ref 0–37)
Albumin: 4 g/dL (ref 3.5–5.2)
Alkaline Phosphatase: 87 U/L (ref 39–117)
BUN: 18 mg/dL (ref 6–23)
CO2: 34 mEq/L — ABNORMAL HIGH (ref 19–32)
Calcium: 9.4 mg/dL (ref 8.4–10.5)
Chloride: 93 mEq/L — ABNORMAL LOW (ref 96–112)
Creatinine, Ser: 0.75 mg/dL (ref 0.40–1.20)
GFR: 76.15 mL/min (ref >60.00)
Glucose, Bld: 117 mg/dL — ABNORMAL HIGH (ref 70–99)
Potassium: 3.8 mEq/L (ref 3.5–5.1)
Sodium: 135 mEq/L (ref 135–145)
Total Bilirubin: 0.5 mg/dL (ref 0.2–1.2)
Total Protein: 7.1 g/dL (ref 6.0–8.3)

## 2019-02-23 LAB — LIPID PANEL
Cholesterol: 223 mg/dL — ABNORMAL HIGH (ref 0–200)
HDL: 40.8 mg/dL (ref >39.00)
LDL Cholesterol: 157 mg/dL — ABNORMAL HIGH (ref 0–99)
NonHDL: 181.9
Total CHOL/HDL Ratio: 5
Triglycerides: 126 mg/dL (ref 0.0–149.0)
VLDL: 25.2 mg/dL (ref 0.0–40.0)

## 2019-02-23 LAB — CBC WITH DIFFERENTIAL/PLATELET
Basophils Absolute: 0.1 10*3/uL (ref 0.0–0.1)
Basophils Relative: 0.5 % (ref 0.0–3.0)
Eosinophils Absolute: 0.4 10*3/uL (ref 0.0–0.7)
Eosinophils Relative: 4.6 % (ref 0.0–5.0)
HCT: 48.3 % — ABNORMAL HIGH (ref 36.0–46.0)
Hemoglobin: 15.8 g/dL — ABNORMAL HIGH (ref 12.0–15.0)
Lymphocytes Relative: 28.5 % (ref 12.0–46.0)
Lymphs Abs: 2.8 10*3/uL (ref 0.7–4.0)
MCHC: 32.6 g/dL (ref 30.0–36.0)
MCV: 83.2 fl (ref 78.0–100.0)
Monocytes Absolute: 0.9 10*3/uL (ref 0.1–1.0)
Monocytes Relative: 9.4 % (ref 3.0–12.0)
Neutro Abs: 5.6 10*3/uL (ref 1.4–7.7)
Neutrophils Relative %: 57 % (ref 43.0–77.0)
Platelets: 356 10*3/uL (ref 150.0–400.0)
RBC: 5.81 Mil/uL — ABNORMAL HIGH (ref 3.87–5.11)
RDW: 14.4 % (ref 11.5–15.5)
WBC: 9.7 10*3/uL (ref 4.0–10.5)

## 2019-02-23 LAB — VITAMIN B12: Vitamin B-12: 280 pg/mL (ref 211–911)

## 2019-02-23 LAB — T4, FREE: Free T4: 0.85 ng/dL (ref 0.60–1.60)

## 2019-02-23 LAB — HEMOGLOBIN A1C: Hgb A1c MFr Bld: 6.7 % — ABNORMAL HIGH (ref 4.6–6.5)

## 2019-02-23 LAB — TSH: TSH: 2.62 u[IU]/mL (ref 0.35–4.50)

## 2019-02-23 MED ORDER — ALBUTEROL SULFATE HFA 108 (90 BASE) MCG/ACT IN AERS: 2.0000 | INHALATION_SPRAY | Freq: Four times a day (QID) | RESPIRATORY_TRACT | 0 refills | Status: DC | PRN

## 2019-02-23 MED ORDER — BUDESONIDE-FORMOTEROL FUMARATE 80-4.5 MCG/ACT IN AERO: 2.0000 | INHALATION_SPRAY | Freq: Two times a day (BID) | RESPIRATORY_TRACT | 3 refills | Status: DC

## 2019-02-23 NOTE — Progress Notes (Signed)
   Subjective:    Patient ID: Amber Huffman, female    DOB: 08-Jul-1950, 69 y.o.   MRN: [id number]  HPI  Virtual Visit via Telephone Note  I connected with Hortencia Pilar on 02/23/19 at 10:40 AM EST by telephone and verified that I am speaking with the correct person using two identifiers.  Location: Patient: home Provider: office   I discussed the limitations, risks, security and privacy concerns of performing an evaluation and management service by telephone and the availability of in person appointments. I also discussed with the patient that there may be a patient responsible charge related to this service. The patient expressed understanding and agreed to proceed.   History of Present Illness:   Pt states she is having some mild intermittent slight sob. She state has happened couple of times in one week.   She has transient rare  occasional dry cough.  Pt is staying at home most of time. Pt sister going to grocery store. Only person that come in her house is her niece.  Pt also has chronic mild fatigue. She thinks associated to very sedentary lifestyle. Just staying at home.  No swelling of legs. No 02 sat%.   Second hand smoke exposure in the past.   Hearing issues in past 2 months. Difficult to hear.  Pt refuses to use any inhaler.  Pt last sob episodes was one week.   During conversation with me she did not cough at all not even one time.   Observations/Objective:  General- no acute distress, pleasant, alert and oriented. Pleasant.   Assessment and Plan: You have rare occasional mild and transient sob. None today. You declined offer of inhalers initially  then expressed willing to try if needed. Will go ahead and get xray today.   For elevted a1c in past will get cmp and a1c today. Low sugar diet. Expressed did not want to take metformin.  For high cholesterol history expressed would not take statin.  For fatigue placed cbc, b12, b1, tsh, t4 and vit D.  Follow  up date to be determined after lab review.  Will follow you very rare occasional cough. Only had on cough today during entire interview.  Follow Up Instructions:    I discussed the assessment and treatment plan with the patient. The patient was provided an opportunity to ask questions and all were answered. The patient agreed with the plan and demonstrated an understanding of the instructions.   The patient was advised to call back or seek an in-person evaluation if the symptoms worsen or if the condition fails to improve as anticipated.  I provided  20 + minutes of non-face-to-face time during this encounter.   Mackie Pai, PA-C    Review of Systems  Constitutional: Positive for fatigue. Negative for chills.  HENT: Negative for congestion, ear pain, sinus pressure and sore throat.   Respiratory: Positive for cough and shortness of breath.        Vary rare cough.  Very rare sob. None since last week.  Cardiovascular: Negative for chest pain and palpitations.  Musculoskeletal: Negative for back pain and myalgias.  Skin: Negative for rash.  Neurological: Negative for dizziness, weakness, numbness and headaches.  Hematological: Negative for adenopathy. Does not bruise/bleed easily.  Psychiatric/Behavioral: Negative for behavioral problems and confusion.       Objective:   Physical Exam        Assessment & Plan:

## 2019-02-23 NOTE — Patient Instructions (Signed)
You have rare occasional mild and transient sob. None today. You declined offer of inhalers initially  then expressed willing to try if needed. Will go ahead and get xray today.   For elevted a1c in past will get cmp and a1c today. Low sugar diet. Expressed did not want to take metformin.  For high cholesterol history expressed would not take statin.  For fatigue placed cbc, b12, b1, tsh, t4 and vit D.  Follow up date to be determined after lab review.  Will follow you very rare occasional cough. Only had on cough today during entire interview.

## 2019-02-24 ENCOUNTER — Telehealth: Payer: Self-pay

## 2019-02-24 ENCOUNTER — Encounter: Payer: Self-pay | Admitting: *Deleted

## 2019-02-24 NOTE — Telephone Encounter (Signed)
Patient called in to see if someone can call her with her test results and X rays   Please follow up with the patient at [phone]

## 2019-02-24 NOTE — Telephone Encounter (Signed)
Patient notified of results.

## 2019-02-26 LAB — VITAMIN B1: Vitamin B1 (Thiamine): 8 nmol/L (ref 8–30)

## 2019-02-26 LAB — VITAMIN D 1,25 DIHYDROXY
Vitamin D 1, 25 (OH)2 Total: 56 pg/mL (ref 18–72)
Vitamin D2 1, 25 (OH)2: 56 pg/mL
Vitamin D3 1, 25 (OH)2: <8 pg/mL

## 2019-02-28 ENCOUNTER — Encounter: Payer: Self-pay | Admitting: *Deleted

## 2019-04-18 ENCOUNTER — Telehealth: Payer: Self-pay

## 2019-04-18 NOTE — Telephone Encounter (Signed)
Nurse Assessment Nurse: Ysidro Evert, RN, Levada Dy Date/Time (Eastern Time): 04/16/2019 11:48:28 AM Confirm and document reason for call. If symptomatic, describe symptoms. ---Caller states she is having pain in her left flank. She had a fever last night but none today. She has had some nausea Has the patient had close contact with a person known or suspected to have the novel coronavirus illness OR traveled / lives in area with major community spread (including international travel) in the last 14 days from the onset of symptoms? * If Asymptomatic, screen for exposure and travel within the last 14 days. ---No Does the patient have any new or worsening symptoms? ---Yes Will a triage be completed? ---Yes Related visit to physician within the last 2 weeks? ---No Does the PT have any chronic conditions? (i.e. diabetes, asthma, this includes High risk factors for pregnancy, etc.) ---Yes List chronic conditions. ---hypertension  ---No Guidelines Guideline Title Affirmed Question Affirmed Notes Nurse Date/Time (Eastern Time) Flank Pain MODERATE pain (e.g., interferes with normal activities or awakens from sleep) Ysidro Evert, RN, Levada Dy 04/16/2019 11:51:02 AM Disp. Time Eilene Ghazi Time) Disposition Final Call Id: [id number] 04/16/2019 11:56:39 AM See PCP within 24 Hours Yes Ysidro Evert, RN, Glenard Haring  Pt was directed to [facility]- do not see where she went.

## 2019-04-18 NOTE — Telephone Encounter (Signed)
Patient had fever 2-3 nights ago, been having R side flank pain. Is she able to come into the office for an OV or able to do virtual and drop off urine , patient believes its a UTI

## 2019-04-19 ENCOUNTER — Other Ambulatory Visit: Payer: Self-pay

## 2019-04-19 NOTE — Telephone Encounter (Signed)
Appt scheduled

## 2019-04-19 NOTE — Telephone Encounter (Signed)
Recommend in office visit since she will need urine culture most likely and maybe labs and imaging depending on physical exam.

## 2019-04-20 ENCOUNTER — Ambulatory Visit: Payer: Medicare Other | Admitting: Medical

## 2019-04-20 ENCOUNTER — Other Ambulatory Visit: Payer: Self-pay

## 2019-04-20 ENCOUNTER — Ambulatory Visit (INDEPENDENT_AMBULATORY_CARE_PROVIDER_SITE_OTHER): Payer: Medicare Other | Admitting: Medical

## 2019-04-20 VITALS — BP 127/87 | HR 115 | Temp 97.0°F | Resp 18 | Wt 220.6 lb

## 2019-04-20 DIAGNOSIS — R300 Dysuria: Secondary | ICD-10-CM | POA: Diagnosis not present

## 2019-04-20 LAB — POC URINALSYSI DIPSTICK (AUTOMATED)
Bilirubin, UA: 1
Blood, UA: NEGATIVE
Glucose, UA: NEGATIVE
Ketones, UA: 5
Nitrite, UA: NEGATIVE
Protein, UA: POSITIVE — AB
Spec Grav, UA: 1.02 (ref 1.010–1.025)
Urobilinogen, UA: 0.2 E.U./dL
pH, UA: 6 (ref 5.0–8.0)

## 2019-04-20 MED ORDER — NITROFURANTOIN MONOHYD MACRO 100 MG PO CAPS: 100.0000 mg | ORAL_CAPSULE | Freq: Two times a day (BID) | ORAL | 0 refills | Status: DC

## 2019-04-20 NOTE — Progress Notes (Signed)
Subjective:    Patient ID: Amber Huffman, female    DOB: 1950-08-18, 69 y.o.   MRN: [id number]  HPI  Pt has urinary symptoms for 2 weeks.  Pt in today reporting urinary symptoms.  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 Hesitancy-no Suprapubic pressure-some fullness and pain recently. Fever-no chills-no Nausea- end of last week. Vomiting-no CVA pain- faint rt cva area pain about 2 weed. History of UTI-no. But state mom constantly had uti. Pt had occasional Gross hematuria-no    Review of Systems  Constitutional: Negative for chills, fatigue and fever.  Respiratory: Negative for cough, chest tightness, shortness of breath and wheezing.   Cardiovascular: Negative for chest pain and palpitations.  Gastrointestinal: Negative for abdominal pain, constipation, diarrhea, nausea and vomiting.  Musculoskeletal: Positive for back pain.       Maybe rt cva area recently.  Skin: Negative for rash.  Neurological: Negative for dizziness, seizures, numbness and headaches.  Hematological: Negative for adenopathy. Does not bruise/bleed easily.  Psychiatric/Behavioral: Negative for behavioral problems.     Past Medical History:  Diagnosis Date  . Hyperlipidemia   . Hypertension   . Morbid obesity with BMI of 50.0-59.9, adult [facility])      Social History   Socioeconomic History  . Marital status: Married    Spouse name: Not on file  . Number of children: Not on file  . Years of education: Not on file  . Highest education level: Not on file  Occupational History  . Not on file  Tobacco Use  . Smoking status: Passive Smoke Exposure - Never Smoker  . Smokeless tobacco: Never Used  Substance and Sexual Activity  . 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  . Drug use: Never  . Sexual activity: Not on file  Other  Topics Concern  . Not on file  Social History Narrative  . Not on file   Social Determinants of Health   Financial Resource Strain:   . Difficulty of Paying Living Expenses:   Food Insecurity:   . Worried About Charity fundraiser in the Last Year:   . Arboriculturist in the Last Year:   Transportation Needs:   . Film/video editor (Medical):   Marland Kitchen Lack of Transportation (Non-Medical):   Physical Activity:   . Days of Exercise per Week:   . Minutes of Exercise per Session:   Stress:   . Feeling of Stress :   Social Connections:   . Frequency of Communication with Friends and Family:   . Frequency of Social Gatherings with Friends and Family:   . Attends Religious Services:   . Active Member of Clubs or Organizations:   . Attends Archivist Meetings:   Marland Kitchen Marital Status:   Intimate Partner Violence:   . Fear of Current or Ex-Partner:   . Emotionally Abused:   Marland Kitchen Physically Abused:   . Sexually Abused:     Past Surgical History:  Procedure Laterality Date  . ABDOMINAL HYSTERECTOMY      Family History  Problem Relation Age of Onset  . Heart disease Father        After the age of 71    Allergies  Allergen Reactions  . Penicillins Rash    Has patient had a PCN reaction causing immediate rash, facial/tongue/throat swelling, SOB or  lightheadedness with hypotension: Yes Has patient had a PCN reaction causing severe rash involving mucus membranes or skin necrosis: No Has patient had a PCN reaction that required hospitalization: No Has patient had a PCN reaction occurring within the last 10 years: No If all of the above answers are "NO", then may proceed with Cephalosporin use.    Current Outpatient Medications on File Prior to Visit  Medication Sig Dispense Refill  . albuterol (VENTOLIN HFA) 108 (90 Base) MCG/ACT inhaler Inhale 2 puffs into the lungs every 6 (six) hours as needed for wheezing or shortness of breath. (Patient not taking: Reported on 02/23/2019)  18 g 0  . albuterol (VENTOLIN HFA) 108 (90 Base) MCG/ACT inhaler Inhale 2 puffs into the lungs every 6 (six) hours as needed. 18 g 0  . budesonide-formoterol (SYMBICORT) 80-4.5 MCG/ACT inhaler Inhale 2 puffs into the lungs 2 (two) times daily. 1 Inhaler 3  . hydrochlorothiazide (HYDRODIURIL) 25 MG tablet Take 1 tablet (25 mg total) by mouth daily. 90 tablet 3  . ibuprofen (ADVIL,MOTRIN) 800 MG tablet Take 800 mg by mouth daily as needed (pain).    . Vitamin D, Ergocalciferol, (DRISDOL) 1.25 MG (50000 UT) CAPS capsule Take 1 capsule (50,000 Units total) by mouth every Thursday. 5 capsule 5   No current facility-administered medications on file prior to visit.    BP 127/87 (BP Location: Left Arm, Patient Position: Sitting, Cuff Size: Large)   Pulse (!) 115   Temp (!) 97 F (36.1 C) (Temporal)   Resp 18   Wt 220 lb 9.6 oz (100.1 kg)   SpO2 95%   BMI 45.32 kg/m       Objective:   Physical Exam   General Appearance- Not in acute distress.  HEENT Eyes- Scleraeral/Conjuntiva-bilat- Not Yellow. Mouth & Throat- Normal.  Chest and Lung Exam Auscultation: Breath sounds:-Normal. Adventitious sounds:- No Adventitious sounds.  Cardiovascular Auscultation:Rythm - Regular. Heart Sounds -Normal heart sounds.  Abdomen Inspection:-Inspection Normal.  Palpation/Perucssion: Palpation and Percussion of the abdomen reveal- Non Tender, No Rebound tenderness, No rigidity(Guarding) and No Palpable abdominal masses.  Liver:-Normal.  Spleen:- Normal.   Back-no cva tenderness.     Assessment & Plan:  You appear to have a urinary tract infection based on some symptoms and suspicious urine study in office. I am prescribing macrobid  antibiotic for the probable infection. Hydrate well. I am sending out a urine culture. During the interim if your signs and symptoms worsen rather than improving please notify us. We will notify your when the culture results are back.  Follow up in 7 days or as  needed.  Time spent with patient today was  20   minutes which consisted of  discussing diagnosis, work up, treatment and documentation.  Mackie Pai, PA-C

## 2019-04-20 NOTE — Patient Instructions (Signed)
You appear to have a urinary tract infection based on some symptoms and suspicious urine study in office. I am prescribing macrobid  antibiotic for the probable infection. Hydrate well. I am sending out a urine culture. During the interim if your signs and symptoms worsen rather than improving please notify us. We will notify your when the culture results are back.  Follow up in 7 days or as needed.

## 2019-04-21 LAB — URINE CULTURE
MICRO NUMBER:: 10337030
SPECIMEN QUALITY:: ADEQUATE

## 2019-07-05 ENCOUNTER — Ambulatory Visit: Payer: Medicare Other | Admitting: *Deleted

## 2019-07-19 ENCOUNTER — Emergency Department (HOSPITAL_BASED_OUTPATIENT_CLINIC_OR_DEPARTMENT_OTHER)
Admission: EM | Admit: 2019-07-19 | Discharge: 2019-07-19 | Disposition: A | Discharge status: Home / Self Care | Payer: Medicare Other | Attending: Emergency Medicine | Admitting: Emergency Medicine

## 2019-07-19 ENCOUNTER — Emergency Department (HOSPITAL_BASED_OUTPATIENT_CLINIC_OR_DEPARTMENT_OTHER): Payer: Medicare Other

## 2019-07-19 ENCOUNTER — Encounter (HOSPITAL_BASED_OUTPATIENT_CLINIC_OR_DEPARTMENT_OTHER): Payer: Self-pay | Admitting: *Deleted

## 2019-07-19 ENCOUNTER — Other Ambulatory Visit: Payer: Self-pay

## 2019-07-19 DIAGNOSIS — I10 Essential (primary) hypertension: Secondary | ICD-10-CM | POA: Diagnosis not present

## 2019-07-19 DIAGNOSIS — K8020 Calculus of gallbladder without cholecystitis without obstruction: Secondary | ICD-10-CM | POA: Insufficient documentation

## 2019-07-19 DIAGNOSIS — Z79899 Other long term (current) drug therapy: Secondary | ICD-10-CM | POA: Diagnosis not present

## 2019-07-19 DIAGNOSIS — R1011 Right upper quadrant pain: Secondary | ICD-10-CM | POA: Diagnosis present

## 2019-07-19 DIAGNOSIS — Z7722 Contact with and (suspected) exposure to environmental tobacco smoke (acute) (chronic): Secondary | ICD-10-CM | POA: Diagnosis not present

## 2019-07-19 LAB — URINALYSIS, ROUTINE W REFLEX MICROSCOPIC
Bilirubin Urine: NEGATIVE
Glucose, UA: NEGATIVE mg/dL
Ketones, ur: 15 mg/dL — AB
Leukocytes,Ua: NEGATIVE
Nitrite: NEGATIVE
Protein, ur: NEGATIVE mg/dL
Specific Gravity, Urine: >1.03 — ABNORMAL HIGH (ref 1.005–1.030)
pH: 5.5 (ref 5.0–8.0)

## 2019-07-19 LAB — COMPREHENSIVE METABOLIC PANEL
ALT: 15 U/L (ref 0–44)
AST: 18 U/L (ref 15–41)
Albumin: 4 g/dL (ref 3.5–5.0)
Alkaline Phosphatase: 73 U/L (ref 38–126)
Anion gap: 14 (ref 5–15)
BUN: 30 mg/dL — ABNORMAL HIGH (ref 8–23)
CO2: 31 mmol/L (ref 22–32)
Calcium: 9.2 mg/dL (ref 8.9–10.3)
Chloride: 92 mmol/L — ABNORMAL LOW (ref 98–111)
Creatinine, Ser: 0.7 mg/dL (ref 0.44–1.00)
GFR calc Af Amer: >60 mL/min (ref >60)
GFR calc non Af Amer: >60 mL/min (ref >60)
Glucose, Bld: 211 mg/dL — ABNORMAL HIGH (ref 70–99)
Potassium: 3.2 mmol/L — ABNORMAL LOW (ref 3.5–5.1)
Sodium: 137 mmol/L (ref 135–145)
Total Bilirubin: 0.7 mg/dL (ref 0.3–1.2)
Total Protein: 7.7 g/dL (ref 6.5–8.1)

## 2019-07-19 LAB — CBC
HCT: 49 % — ABNORMAL HIGH (ref 36.0–46.0)
Hemoglobin: 15.7 g/dL — ABNORMAL HIGH (ref 12.0–15.0)
MCH: 27.1 pg (ref 26.0–34.0)
MCHC: 32 g/dL (ref 30.0–36.0)
MCV: 84.5 fL (ref 80.0–100.0)
Platelets: 341 10*3/uL (ref 150–400)
RBC: 5.8 MIL/uL — ABNORMAL HIGH (ref 3.87–5.11)
RDW: 13.8 % (ref 11.5–15.5)
WBC: 12.6 10*3/uL — ABNORMAL HIGH (ref 4.0–10.5)
nRBC: 0 % (ref 0.0–0.2)

## 2019-07-19 LAB — URINALYSIS, MICROSCOPIC (REFLEX)

## 2019-07-19 LAB — LIPASE, BLOOD: Lipase: 22 U/L (ref 11–51)

## 2019-07-19 MED ORDER — ONDANSETRON HCL 4 MG PO TABS
4.0000 mg | ORAL_TABLET | Freq: Four times a day (QID) | ORAL | 0 refills | Status: DC
Start: 2019-07-19 — End: 2019-09-13

## 2019-07-19 MED ORDER — ONDANSETRON 4 MG PO TBDP
4.0000 mg | ORAL_TABLET | Freq: Once | ORAL | Status: AC
  Administered 2019-07-19: 4 mg via ORAL
  Filled 2019-07-19: qty 1

## 2019-07-19 MED ORDER — OXYCODONE-ACETAMINOPHEN 5-325 MG PO TABS: 1.0000 | ORAL_TABLET | ORAL | 0 refills | Status: DC | PRN

## 2019-07-19 MED ORDER — LIDOCAINE VISCOUS HCL 2 % MT SOLN
15.0000 mL | Freq: Once | OROMUCOSAL | Status: AC
  Administered 2019-07-19: 15 mL via ORAL
  Filled 2019-07-19: qty 15

## 2019-07-19 MED ORDER — ALUM & MAG HYDROXIDE-SIMETH 200-200-20 MG/5ML PO SUSP
30.0000 mL | Freq: Once | ORAL | Status: AC
  Administered 2019-07-19: 30 mL via ORAL
  Filled 2019-07-19: qty 30

## 2019-07-19 MED ORDER — OXYCODONE-ACETAMINOPHEN 5-325 MG PO TABS
1.0000 | ORAL_TABLET | Freq: Once | ORAL | Status: AC
  Administered 2019-07-19: 1 via ORAL
  Filled 2019-07-19: qty 1

## 2019-07-19 MED ORDER — LORAZEPAM 2 MG/ML IJ SOLN
1.0000 mg | Freq: Once | INTRAMUSCULAR | Status: DC
  Filled 2019-07-19: qty 1

## 2019-07-19 NOTE — ED Provider Notes (Signed)
[facility] EM[facility]GENCY DEPARTMENT Provider Note   CSN: [id number] Arrival date & time: 07/19/19  1751     History Chief Complaint  Patient presents with  . Abdominal Pain    Amber Huffman is a 68 y.o. female.  HPI   32yF with abdominal pain. Onset yesterday. RUQ underneath R breast and epigastric. Began shortly after eating shrimp. Pain has actually subsided since being in the ED although not completely resolved. Nausea. Vomiting once yesterday. No fever or chills. Says it reminded her "of a gallbladder attack but I haven't had one of those in 35 years." No urinary complaints.   Past Medical History:  Diagnosis Date  . Hyperlipidemia   . Hypertension   . Morbid obesity with BMI of 50.0-59.9, adult [facility])     Patient Active Problem List   Diagnosis Date Noted  . Essential hypertension   . Pure hypercholesterolemia   . Chest pain 10/24/2017  . Morbid obesity ([place]) 10/24/2017  . Passive smoke exposure 10/24/2017  . Sinus congestion 10/24/2017    Past Surgical History:  Procedure Laterality Date  . ABDOMINAL HYSTERECTOMY       OB History   No obstetric history on file.     Family History  Problem Relation Age of Onset  . Heart disease Father        After the age of 70    Social History   Tobacco Use  . Smoking status: Passive Smoke Exposure - Never Smoker  . Smokeless tobacco: Never Used  Substance Use Topics  . 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  . Drug use: Never    Home Medications Prior to Admission medications   Medication Sig Start Date End Date Taking? Authorizing Provider  hydrochlorothiazide (HYDRODIURIL) 25 MG tablet Take 1 tablet (25 mg total) by mouth daily. 09/17/18  Yes Saguier, Percell Galeno, PA-C  ibuprofen (ADVIL,MOTRIN) 800 MG tablet Take 800 mg by mouth daily as needed (pain).    [provider]    Vitamin D, Ergocalciferol, (DRISDOL) 1.25 MG (50000 UT) CAPS capsule Take 1 capsule (50,000 Units total) by mouth every Thursday. 12/09/18   Saguier, Percell Hally, PA-C    Allergies    Penicillins  Review of Systems   Review of Systems All systems reviewed and negative, other than as noted in HPI.  Physical Exam Updated Vital Signs BP (!) 148/84 (BP Location: Right Arm)   Pulse 87   Temp 97.8 F (36.6 C) (Oral)   Resp 18   Ht 4\' 10"  (1.473 m)   Wt 99.4 kg   SpO2 97%   BMI 45.81 kg/m   Physical Exam Vitals and nursing note reviewed.  Constitutional:      General: She is not in acute distress.    Appearance: She is well-developed.  HENT:     Head: Normocephalic and atraumatic.  Eyes:     General:        Right eye: No discharge.        Left eye: No discharge.     Conjunctiva/sclera: Conjunctivae normal.  Cardiovascular:     Rate and Rhythm: Normal rate and regular rhythm.     Heart sounds: Normal heart sounds. No murmur heard.  No friction rub. No gallop.   Pulmonary:     Effort: Pulmonary effort is normal. No respiratory distress.     Breath sounds: Normal  breath sounds.  Abdominal:     General: There is no distension.     Palpations: Abdomen is soft.     Tenderness: There is abdominal tenderness.     Comments: Epigastric and RUQ tenderness w/o rebound or guarding  Musculoskeletal:        General: No tenderness.     Cervical back: Neck supple.  Skin:    General: Skin is warm and dry.  Neurological:     Mental Status: She is alert.  Psychiatric:        Behavior: Behavior normal.        Thought Content: Thought content normal.     ED Results / Procedures / Treatments   Labs (all labs ordered are listed, but only abnormal results are displayed) Labs Reviewed  COMPREHENSIVE METABOLIC PANEL - Abnormal; Notable for the following components:      Result Value   Potassium 3.2 (*)    Chloride 92 (*)    Glucose, Bld 211 (*)    BUN 30 (*)    All other components  within normal limits  CBC - Abnormal; Notable for the following components:   WBC 12.6 (*)    RBC 5.80 (*)    Hemoglobin 15.7 (*)    HCT 49.0 (*)    All other components within normal limits  URINALYSIS, ROUTINE W REFLEX MICROSCOPIC - Abnormal; Notable for the following components:   Specific Gravity, Urine >1.030 (*)    Hgb urine dipstick TRACE (*)    Ketones, ur 15 (*)    All other components within normal limits  URINALYSIS, MICROSCOPIC (REFLEX) - Abnormal; Notable for the following components:   Bacteria, UA FEW (*)    All other components within normal limits  LIPASE, BLOOD    EKG EKG Interpretation  Date/Time:  Tuesday July 19 2019 08:36:53 EDT Ventricular Rate:  84 PR Interval:    QRS Duration: 79 QT Interval:  377 QTC Calculation: 446 R Axis:   -28 Text Interpretation: Sinus rhythm Borderline left axis deviation Confirmed by Virgel Manifold [id number]) on 07/19/2019 8:46:23 AM Also confirmed by Virgel Manifold [phone])  on 07/19/2019 8:47:37 AM   Radiology No results found.  Procedures Procedures (including critical care time)  Medications Ordered in ED Medications  alum & mag hydroxide-simeth (MAALOX/MYLANTA) 200-200-20 MG/5ML suspension 30 mL (30 mLs Oral Given 07/19/19 6759)    And  lidocaine (XYLOCAINE) 2 % viscous mouth solution 15 mL (15 mLs Oral Given 07/19/19 1638)  oxyCODONE-acetaminophen (PERCOCET/ROXICET) 5-325 MG per tablet 1 tablet (1 tablet Oral Given 07/19/19 1023)  ondansetron (ZOFRAN-ODT) disintegrating tablet 4 mg (4 mg Oral Given 07/19/19 1023)    ED Course  I have reviewed the triage vital signs and the nursing notes.  Pertinent labs & imaging results that were available during my care of the patient were reviewed by me and considered in my medical decision making (see chart for details).    MDM Rules/Calculators/A&P                          69 year old female with epigastric/right upper quadrant pain.  Likely symptomatic cholelithiasis.  Stones and  sludge noted.  LFTs fine.  Lipase normal.  Mild leukocytosis, clinically doubt cholecystitis.  Afebrile.  No gallbladder wall thickening or pericholecystic fluid.  Currently feeling much better.  Continued symptomatic treatment.  Diet discussed.  General surgical follow-up.  Final Clinical Impression(s) / ED Diagnoses Final diagnoses:  Symptomatic cholelithiasis    Rx /  DC Orders ED Discharge Orders    None       Virgel Manifold, MD 07/19/19 1058

## 2019-07-19 NOTE — ED Notes (Signed)
ED Provider at bedside. 

## 2019-07-19 NOTE — ED Triage Notes (Addendum)
RUQ pain started 5-6 hours ago with nausea and she vomited x 1.  Patient stated she ate 48 pieces of shrimps in 2 different times, Monday early evening and later that evening.

## 2019-07-20 ENCOUNTER — Telehealth: Payer: Self-pay | Admitting: Medical

## 2019-07-20 NOTE — Telephone Encounter (Signed)
Appt scheduled

## 2019-07-20 NOTE — Telephone Encounter (Signed)
Pt seen in ED. Please schedule in office follow up next week.

## 2019-07-27 ENCOUNTER — Other Ambulatory Visit: Payer: Self-pay

## 2019-07-27 ENCOUNTER — Ambulatory Visit (INDEPENDENT_AMBULATORY_CARE_PROVIDER_SITE_OTHER): Payer: Medicare Other | Admitting: Medical

## 2019-07-27 VITALS — BP 130/80 | HR 97 | Temp 98.0°F | Resp 18 | Ht <= 58 in | Wt 218.6 lb

## 2019-07-27 DIAGNOSIS — D72829 Elevated white blood cell count, unspecified: Secondary | ICD-10-CM

## 2019-07-27 DIAGNOSIS — K8020 Calculus of gallbladder without cholecystitis without obstruction: Secondary | ICD-10-CM | POA: Diagnosis not present

## 2019-07-27 DIAGNOSIS — Z1211 Encounter for screening for malignant neoplasm of colon: Secondary | ICD-10-CM | POA: Diagnosis not present

## 2019-07-27 DIAGNOSIS — R109 Unspecified abdominal pain: Secondary | ICD-10-CM

## 2019-07-27 DIAGNOSIS — R7989 Other specified abnormal findings of blood chemistry: Secondary | ICD-10-CM

## 2019-07-27 LAB — COMPREHENSIVE METABOLIC PANEL
ALT: 12 U/L (ref 0–35)
AST: 14 U/L (ref 0–37)
Albumin: 4 g/dL (ref 3.5–5.2)
Alkaline Phosphatase: 77 U/L (ref 39–117)
BUN: 13 mg/dL (ref 6–23)
CO2: 34 mEq/L — ABNORMAL HIGH (ref 19–32)
Calcium: 9.4 mg/dL (ref 8.4–10.5)
Chloride: 96 mEq/L (ref 96–112)
Creatinine, Ser: 0.69 mg/dL (ref 0.40–1.20)
GFR: 83.75 mL/min (ref >60.00)
Glucose, Bld: 113 mg/dL — ABNORMAL HIGH (ref 70–99)
Potassium: 4.3 mEq/L (ref 3.5–5.1)
Sodium: 137 mEq/L (ref 135–145)
Total Bilirubin: 0.5 mg/dL (ref 0.2–1.2)
Total Protein: 6.8 g/dL (ref 6.0–8.3)

## 2019-07-27 LAB — CBC WITH DIFFERENTIAL/PLATELET
Basophils Absolute: 0.1 10*3/uL (ref 0.0–0.1)
Basophils Relative: 0.6 % (ref 0.0–3.0)
Eosinophils Absolute: 0.4 10*3/uL (ref 0.0–0.7)
Eosinophils Relative: 4.4 % (ref 0.0–5.0)
HCT: 47.2 % — ABNORMAL HIGH (ref 36.0–46.0)
Hemoglobin: 15.6 g/dL — ABNORMAL HIGH (ref 12.0–15.0)
Lymphocytes Relative: 23.3 % (ref 12.0–46.0)
Lymphs Abs: 2.3 10*3/uL (ref 0.7–4.0)
MCHC: 33.1 g/dL (ref 30.0–36.0)
MCV: 83.3 fl (ref 78.0–100.0)
Monocytes Absolute: 0.8 10*3/uL (ref 0.1–1.0)
Monocytes Relative: 8.2 % (ref 3.0–12.0)
Neutro Abs: 6.2 10*3/uL (ref 1.4–7.7)
Neutrophils Relative %: 63.5 % (ref 43.0–77.0)
Platelets: 314 10*3/uL (ref 150.0–400.0)
RBC: 5.66 Mil/uL — ABNORMAL HIGH (ref 3.87–5.11)
RDW: 14.7 % (ref 11.5–15.5)
WBC: 9.8 10*3/uL (ref 4.0–10.5)

## 2019-07-27 LAB — LIPASE: Lipase: 8 U/L — ABNORMAL LOW (ref 11.0–59.0)

## 2019-07-27 MED ORDER — FAMOTIDINE 20 MG PO TABS
20.0000 mg | ORAL_TABLET | Freq: Every day | ORAL | 0 refills | Status: DC
Start: 2019-07-27 — End: 2019-09-13

## 2019-07-27 NOTE — Progress Notes (Signed)
Subjective:    Patient ID: Amber Huffman, female    DOB: 03/13/1950, 69 y.o.   MRN: [id number]  HPI  Pt in for follow up recent ruq pain. Pt went to ED on July 19, 2019. Pt states pain occurred after eating a lot of shrimp.  ED HPI. 69yF with abdominal pain. Onset yesterday. RUQ underneath R breast and epigastric. Began shortly after eating shrimp. Pain has actually subsided since being in the ED although not completely resolved. Nausea. Vomiting once yesterday. No fever or chills. Says it reminded her "of a gallbladder attack but I haven't had one of those in 35 years." No urinary complaints.   A/P from ED. 69 year old female with epigastric/right upper quadrant pain.  Likely symptomatic cholelithiasis.  Stones and sludge noted.  LFTs fine.  Lipase normal.  Mild leukocytosis, clinically doubt cholecystitis.  Afebrile.  No gallbladder wall thickening or pericholecystic fluid.  Currently feeling much better.  Continued symptomatic treatment.  Diet discussed.  General surgical follow-up.  Pt tells me in ED she had GI cocktail and pain immediatley decreased.   She states still has faint underling sensation of not feeling rt in her stomach. No fever, no chills, no nausea or vomiting since DC from ED.  Pt notes she needs updated screening colonoscopy. Former pt of Dr. Lyndel Safe.   IMPRESSION: Dilated gallbladder with cholelithiasis and sludge. No additional sonographic evidence of acute cholecystitis.  Increased liver echogenicity likely reflecting steatosis.     Review of Systems  Constitutional: Negative for chills, fatigue and fever.  HENT: Negative for congestion and drooling.   Respiratory: Negative for cough, chest tightness, shortness of breath and wheezing.   Cardiovascular: Negative for chest pain and palpitations.  Gastrointestinal: Negative for abdominal distention, abdominal pain, blood in stool, constipation and diarrhea.       See hpi.  Musculoskeletal: Negative for back  pain, gait problem and neck pain.  Skin: Negative for rash.  Hematological: Negative for adenopathy. Does not bruise/bleed easily.  Psychiatric/Behavioral: Negative for behavioral problems and confusion.    Past Medical History:  Diagnosis Date  . Hyperlipidemia   . Hypertension   . Morbid obesity with BMI of 50.0-59.9, adult [facility])      Social History   Socioeconomic History  . Marital status: Married    Spouse name: Not on file  . Number of children: Not on file  . Years of education: Not on file  . Highest education level: Not on file  Occupational History  . Not on file  Tobacco Use  . Smoking status: Passive Smoke Exposure - Never Smoker  . Smokeless tobacco: Never Used  Substance and Sexual Activity  . 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  . Drug use: Never  . Sexual activity: Not on file  Other Topics Concern  . Not on file  Social History Narrative  . Not on file   Social Determinants of Health   Financial Resource Strain:   . Difficulty of Paying Living Expenses:   Food Insecurity:   . Worried About Charity fundraiser in the Last Year:   . Arboriculturist in the Last Year:   Transportation Needs:   . Film/video editor (Medical):   Marland Kitchen Lack of Transportation (Non-Medical):   Physical Activity:   . Days of Exercise per Week:   . Minutes of  Exercise per Session:   Stress:   . Feeling of Stress :   Social Connections:   . Frequency of Communication with Friends and Family:   . Frequency of Social Gatherings with Friends and Family:   . Attends Religious Services:   . Active Member of Clubs or Organizations:   . Attends Archivist Meetings:   Marland Kitchen Marital Status:   Intimate Partner Violence:   . Fear of Current or Ex-Partner:   . Emotionally Abused:   Marland Kitchen Physically Abused:   . Sexually Abused:     Past Surgical  History:  Procedure Laterality Date  . ABDOMINAL HYSTERECTOMY      Family History  Problem Relation Age of Onset  . Heart disease Father        After the age of 34    Allergies  Allergen Reactions  . Penicillins Rash    Has patient had a PCN reaction causing immediate rash, facial/tongue/throat swelling, SOB or lightheadedness with hypotension: Yes Has patient had a PCN reaction causing severe rash involving mucus membranes or skin necrosis: No Has patient had a PCN reaction that required hospitalization: No Has patient had a PCN reaction occurring within the last 10 years: No If all of the above answers are "NO", then may proceed with Cephalosporin use.    Current Outpatient Medications on File Prior to Visit  Medication Sig Dispense Refill  . hydrochlorothiazide (HYDRODIURIL) 25 MG tablet Take 1 tablet (25 mg total) by mouth daily. 90 tablet 3  . ibuprofen (ADVIL,MOTRIN) 800 MG tablet Take 800 mg by mouth daily as needed (pain).    . ondansetron (ZOFRAN) 4 MG tablet Take 1 tablet (4 mg total) by mouth every 6 (six) hours. 12 tablet 0  . oxyCODONE-acetaminophen (PERCOCET/ROXICET) 5-325 MG tablet Take 1 tablet by mouth every 4 (four) hours as needed for severe pain. 12 tablet 0  . Vitamin D, Ergocalciferol, (DRISDOL) 1.25 MG (50000 UT) CAPS capsule Take 1 capsule (50,000 Units total) by mouth every Thursday. 5 capsule 5   No current facility-administered medications on file prior to visit.    BP 130/80 (BP Location: Left Wrist, Patient Position: Sitting, Cuff Size: Large)   Pulse 97   Temp 98 F (36.7 C) (Oral)   Resp 18   Ht 4\' 10"  (1.473 m)   Wt 218 lb 9.6 oz (99.2 kg)   SpO2 97%   BMI 45.69 kg/m       Objective:   Physical Exam   General- No acute distress. Pleasant patient. Neck- Full range of motion, no jvd Lungs- Clear, even and unlabored. Heart- regular rate and rhythm. Neurologic- CNII- XII grossly intact.  Abdomen- soft, non-distended, +bs, no rebound  or guarding. Faint rt upper quadrant tenderness.       Assessment & Plan:  You have gallstones with mostly resolved pain. Will get cbc, cmp and lipase repeat labs. Make sure wbc not increased. Will go ahead and refer to surgeon for evaluation/treatment.  If pain returns severe then ED evaluation as cholocysitis can occur and require emergent surgery.  Rx famotadine since gi cocktail helped. Eat low fat/nongreasy foods.  Get vit d level today.  Refer to Dr. Lyndel Safe for screening colonoscopy and wt loss. Point out prior wt near 240 lb.  Follow up 2-3 weeks or as needed  Time spent with patient today was  31 minutes which consisted of chart revdiew, discussing diagnosis, work up, treatment and documentation.

## 2019-07-27 NOTE — Patient Instructions (Addendum)
You have gallstones with mostly resolved pain. Will get cbc, cmp and lipase repeat labs. Make sure wbc not increased. Will go ahead and refer to surgeon for evaluation/treatment.  If pain returns severe then ED evaluation as cholocysitis can occur and require emergent surgery.  Rx famotadine since gi cocktail helped. Eat low fat/nongreasy foods.  Get vit d level today.  Refer to Dr. Lyndel Safe for screening colonoscopy and wt loss. Point out prior wt near 240 lb.  Follow up 2-3 weeks or as needed

## 2019-07-29 ENCOUNTER — Encounter: Payer: Self-pay | Admitting: Nurse Practitioner

## 2019-07-29 ENCOUNTER — Telehealth: Payer: Self-pay | Admitting: Medical

## 2019-07-29 NOTE — Telephone Encounter (Signed)
Caller: Sammye Call back phone number: [phone]  Need lab results

## 2019-07-30 LAB — VITAMIN D 1,25 DIHYDROXY
Vitamin D 1, 25 (OH)2 Total: 45 pg/mL (ref 18–72)
Vitamin D2 1, 25 (OH)2: 45 pg/mL
Vitamin D3 1, 25 (OH)2: <8 pg/mL

## 2019-08-04 ENCOUNTER — Telehealth: Payer: Self-pay | Admitting: Medical

## 2019-08-04 ENCOUNTER — Telehealth: Payer: Self-pay

## 2019-08-04 NOTE — Telephone Encounter (Signed)
I referred pt to both gi for wt loss and screening colonoscopy. Also referred to surgeron for gall stones. So need to know what is going on. Is she having rt upper abd/gallbladder pain. If so and severe then ED. But can ask Drue Dun to try to get her seen sooner if milder rt upper abd pain. If having more weight loss then can try to move up gi MD referral. Let me know what she says.

## 2019-08-04 NOTE — Telephone Encounter (Signed)
Patient wants to go another doctor that will see her early than Sept. ,

## 2019-08-05 NOTE — Telephone Encounter (Signed)
Opened for review. 

## 2019-08-12 NOTE — Telephone Encounter (Signed)
Patient states she is unaware of weight loss & advised to go the ED if pain gets severe .

## 2019-08-16 ENCOUNTER — Other Ambulatory Visit: Payer: Self-pay

## 2019-08-16 ENCOUNTER — Ambulatory Visit (HOSPITAL_BASED_OUTPATIENT_CLINIC_OR_DEPARTMENT_OTHER)
Admission: RE | Admit: 2019-08-16 | Discharge: 2019-08-16 | Disposition: A | Discharge status: Home / Self Care | Payer: Medicare Other | Source: Ambulatory Visit | Attending: Medical | Admitting: Medical

## 2019-08-16 ENCOUNTER — Telehealth: Payer: Self-pay | Admitting: Medical

## 2019-08-16 ENCOUNTER — Ambulatory Visit (INDEPENDENT_AMBULATORY_CARE_PROVIDER_SITE_OTHER): Payer: Medicare Other | Admitting: Medical

## 2019-08-16 ENCOUNTER — Other Ambulatory Visit (HOSPITAL_BASED_OUTPATIENT_CLINIC_OR_DEPARTMENT_OTHER): Payer: Self-pay | Admitting: Medical

## 2019-08-16 VITALS — BP 144/82 | HR 94 | Resp 18 | Ht <= 58 in | Wt 216.2 lb

## 2019-08-16 DIAGNOSIS — S46811A Strain of other muscles, fascia and tendons at shoulder and upper arm level, right arm, initial encounter: Secondary | ICD-10-CM

## 2019-08-16 DIAGNOSIS — M542 Cervicalgia: Secondary | ICD-10-CM | POA: Insufficient documentation

## 2019-08-16 DIAGNOSIS — Z1231 Encounter for screening mammogram for malignant neoplasm of breast: Secondary | ICD-10-CM

## 2019-08-16 MED ORDER — CYCLOBENZAPRINE HCL 5 MG PO TABS
5.0000 mg | ORAL_TABLET | Freq: Every day | ORAL | 0 refills | Status: DC
Start: 2019-08-16 — End: 2019-08-19

## 2019-08-16 MED ORDER — KETOROLAC TROMETHAMINE 60 MG/2ML IM SOLN
60.0000 mg | Freq: Once | INTRAMUSCULAR | Status: AC
  Administered 2019-08-16: 60 mg via INTRAMUSCULAR

## 2019-08-16 NOTE — Telephone Encounter (Signed)
Agree with advise given. If she won't to to ED then advise urgent care.

## 2019-08-16 NOTE — Patient Instructions (Addendum)
You appear to have some neck pain with trapezius pain..  Quite tender area and trapezius area found near her shoulder.  Your upper extremity and shoulder area is not really tender to palpation.  I think you might be having radicular pain from the neck area.  Pain worse when you do heavy movements.  We will get x-ray of cervical spine.  We gave you Toradol 60 mg IM injection today.  Use Flexeril tonight before sleep.  Starting tomorrow you can use ibuprofen 600 to 800 mg every 8 hours.  You could use Tylenol and this might give additive effect better pain control.  You have oxycodone at home and you can use it as backup for severe pain.  Rx advisement given.  Give me an update by tomorrow see how you did after Flexeril use.  Rx advisement given.  Follow-up in 7 days or as needed.

## 2019-08-16 NOTE — Telephone Encounter (Signed)
Pt states she does not want to go ED, patient states would like to be seen in office.  Please Advise

## 2019-08-16 NOTE — Telephone Encounter (Signed)
New message:   Pt called in with extreme right arm pain. Pt was sent to [facility] for assessment. Kim from [facility] called back and states the pt was advised to go to ER and she states the pt has refused to go to the ER. Please advise.

## 2019-08-16 NOTE — Progress Notes (Signed)
Subjective:    Patient ID: Amber Huffman, female    DOB: 1950-09-16, 69 y.o.   MRN: [id number]  HPI  Pt in with some rt side trapezius area toward her shoulder. Pain is severe. Pt called and she was advised to go to ED. Pt declined and was scheduled here instead. Pt is rt handed.    On note sent to me was rt upper ext but during discussion and exam pain rt trapezius approaching shoulder. Describes some pain radiating to  Downrt upper ext.  Pt has worse pain when she points head downward, upward and when she turns her head to left or right.  No fall or trauma. Pt is taking ibuprofen for this pain and has not help.  Pt states on Sunday morning pain started. On that day ibuprofen did help Sunday and Monday. Today pain not controlled   Pt does have some oxycodone left over from ED work up. She can use if needed.     Review of Systems  Constitutional: Negative for chills, fatigue and fever.  Respiratory: Negative for cough, chest tightness, shortness of breath and wheezing.   Cardiovascular: Negative for chest pain and palpitations.  Gastrointestinal: Negative for abdominal pain.  Musculoskeletal: Positive for neck pain.       Rt trapezius pain.  Skin: Negative for rash.  Neurological: Negative for dizziness, seizures, weakness and headaches.  Hematological: Negative for adenopathy. Does not bruise/bleed easily.  Psychiatric/Behavioral: Negative for behavioral problems and confusion.   Past Medical History:  Diagnosis Date  . Hyperlipidemia   . Hypertension   . Morbid obesity with BMI of 50.0-59.9, adult [facility])      Social History   Socioeconomic History  . Marital status: Married    Spouse name: Not on file  . Number of children: Not on file  . Years of education: Not on file  . Highest education level: Not on file  Occupational History  . Not on file  Tobacco Use  . Smoking status: Passive Smoke Exposure - Never Smoker  . Smokeless tobacco: Never Used  Substance and  Sexual Activity  . 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  . Drug use: Never  . Sexual activity: Not on file  Other Topics Concern  . Not on file  Social History Narrative  . Not on file   Social Determinants of Health   Financial Resource Strain:   . Difficulty of Paying Living Expenses:   Food Insecurity:   . Worried About Charity fundraiser in the Last Year:   . Arboriculturist in the Last Year:   Transportation Needs:   . Film/video editor (Medical):   Marland Kitchen Lack of Transportation (Non-Medical):   Physical Activity:   . Days of Exercise per Week:   . Minutes of Exercise per Session:   Stress:   . Feeling of Stress :   Social Connections:   . Frequency of Communication with Friends and Family:   . Frequency of Social Gatherings with Friends and Family:   . Attends Religious Services:   . Active Member of Clubs or Organizations:   . Attends Archivist Meetings:   Marland Kitchen Marital Status:   Intimate Partner Violence:   . Fear of Current or Ex-Partner:   . Emotionally Abused:   Marland Kitchen Physically Abused:   . Sexually Abused:  Past Surgical History:  Procedure Laterality Date  . ABDOMINAL HYSTERECTOMY      Family History  Problem Relation Age of Onset  . Heart disease Father        After the age of 24    Allergies  Allergen Reactions  . Penicillins Rash    Has patient had a PCN reaction causing immediate rash, facial/tongue/throat swelling, SOB or lightheadedness with hypotension: Yes Has patient had a PCN reaction causing severe rash involving mucus membranes or skin necrosis: No Has patient had a PCN reaction that required hospitalization: No Has patient had a PCN reaction occurring within the last 10 years: No If all of the above answers are "NO", then may proceed with Cephalosporin use.    Current Outpatient Medications on File  Prior to Visit  Medication Sig Dispense Refill  . famotidine (PEPCID) 20 MG tablet Take 1 tablet (20 mg total) by mouth daily. 30 tablet 0  . hydrochlorothiazide (HYDRODIURIL) 25 MG tablet Take 1 tablet (25 mg total) by mouth daily. 90 tablet 3  . ibuprofen (ADVIL,MOTRIN) 800 MG tablet Take 800 mg by mouth daily as needed (pain).    . ondansetron (ZOFRAN) 4 MG tablet Take 1 tablet (4 mg total) by mouth every 6 (six) hours. 12 tablet 0  . oxyCODONE-acetaminophen (PERCOCET/ROXICET) 5-325 MG tablet Take 1 tablet by mouth every 4 (four) hours as needed for severe pain. 12 tablet 0  . Vitamin D, Ergocalciferol, (DRISDOL) 1.25 MG (50000 UT) CAPS capsule Take 1 capsule (50,000 Units total) by mouth every Thursday. 5 capsule 5   No current facility-administered medications on file prior to visit.    BP (!) 144/82   Pulse 94   Resp 18   Ht 4\' 10"  (1.473 m)   Wt 216 lb 3.2 oz (98.1 kg)   SpO2 99%   BMI 45.19 kg/m       Objective:   Physical Exam  General- No acute distress. Pleasant patient. Neck- Full range of motion, no jvd rt sided trapezius pain diffuse but more prominent toward rt shoulder. Has very point tender area. Lungs- Clear, even and unlabored. Heart- regular rate and rhythm. Neurologic- CNII- XII grossly intact.  Rt upper ext- no pain on palpation of shoulder directly or in her arm       Assessment & Plan:  You appear to have some neck pain with trapezius pain..  Quite tender area and trapezius area found near her shoulder.  Your upper extremity and shoulder area is not really tender to palpation.  I think you might be having radicular pain from the neck area.  Pain worse when you do heavy movements.  We will get x-ray of cervical spine.  We gave you Toradol 60 mg IM injection today.  Use Flexeril tonight before sleep.  Starting tomorrow you can use ibuprofen 600 to 800 mg every 8 hours.  You could use Tylenol and this might give additive effect better pain  control.  You have oxycodone at home and you can use it as backup for severe pain.  Rx advisement given.  Give me an update by tomorrow see how you did after Flexeril use.  Rx advisement given.  Follow-up in 7 days or as needed.   Time spent with patient today was  30 minutes which consisted of chart review, discussing diagnosis, work up treatment and documentation.

## 2019-08-17 ENCOUNTER — Ambulatory Visit (INDEPENDENT_AMBULATORY_CARE_PROVIDER_SITE_OTHER): Payer: Medicare Other | Admitting: Cardiovascular Disease

## 2019-08-17 ENCOUNTER — Encounter: Payer: Self-pay | Admitting: Cardiovascular Disease

## 2019-08-17 ENCOUNTER — Ambulatory Visit: Payer: Medicare Other | Admitting: Medical

## 2019-08-17 VITALS — BP 115/83 | HR 93 | Temp 96.8°F | Ht 58.5 in | Wt 216.2 lb

## 2019-08-17 DIAGNOSIS — R079 Chest pain, unspecified: Secondary | ICD-10-CM

## 2019-08-17 DIAGNOSIS — I10 Essential (primary) hypertension: Secondary | ICD-10-CM | POA: Diagnosis not present

## 2019-08-17 DIAGNOSIS — E7800 Pure hypercholesterolemia, unspecified: Secondary | ICD-10-CM | POA: Diagnosis not present

## 2019-08-17 NOTE — Patient Instructions (Signed)
Medication Instructions:  Your physician recommends that you continue on your current medications as directed. Please refer to the Current Medication list given to you today.  *If you need a refill on your cardiac medications before your next appointment, please call your pharmacy*  Lab Work: NONE  Testing/Procedures: NONE  Follow-Up: At [facility], you and your health needs are our priority.  As part of our continuing mission to provide you with exceptional heart care, we have created designated Provider Care Teams.  These Care Teams include your primary Cardiologist (physician) and Advanced Practice Providers (APPs -  Physician Assistants and Nurse Practitioners) who all work together to provide you with the care you need, when you need it.  We recommend signing up for the patient portal called "MyChart".  Sign up information is provided on this After Visit Summary.  MyChart is used to connect with patients for Virtual Visits (Telemedicine).  Patients are able to view lab/test results, encounter notes, upcoming appointments, etc.  Non-urgent messages can be sent to your provider as well.   To learn more about what you can do with MyChart, go to [email or web address].    Your next appointment:   6 month(s) You will receive a reminder letter in the mail two months in advance. If you don't receive a letter, please call our office to schedule the follow-up appointment.  The format for your next appointment:   In Person  Provider:   You may see Skeet Latch, MD or one of the following Advanced Practice Providers on your designated Care Team:    Kerin Ransom, PA-C  [place], [place]  Coletta Memos, [place]  Other Instructions  WORK ON EXERCISING

## 2019-08-17 NOTE — Telephone Encounter (Signed)
Nurse Assessment Nurse: Claiborne Billings, RN, Kim Date/Time (Eastern Time): 08/16/2019 11:31:22 AM Confirm and document reason for call. If symptomatic, describe symptoms. ---Caller states she has had severe pain in her rt arm for the past 2 days. States the pain is from her shoulder down to her elbow, constant and current pain level is "well past a 10". States she has rx Ibuprofen 800mg  and she took at dose at 915am but it is not touching the pain. States she also has nausea. Has the patient had close contact with a person known or suspected to have the novel coronavirus illness OR traveled / lives in area with major community spread (including international travel) in the last 14 days from the onset of symptoms? * If Asymptomatic, screen for exposure and travel within the last 14 days. ---No Does the patient have any new or worsening symptoms? ---Yes Will a triage be completed? ---Yes Related visit to physician within the last 2 weeks? ---No Does the PT have any chronic conditions? (i.e. diabetes, asthma, this includes High risk factors for pregnancy, etc.) ---Yes List chronic conditions. ---HTN Is this a behavioral health or substance abuse call? ---No Guidelines Guideline Title Affirmed Question Affirmed Notes Nurse Date/Time (Eastern Time) Shoulder Pain [1] Age > 40 AND [2] no obvious cause AND [3] pain even when Claiborne Billings, RN, Maudie Mercury 08/16/2019 11:34:56 AM PLEASE NOTE: All timestamps contained within this report are represented as [place] Standard Time. CONFIDENTIALTY NOTICE: This fax transmission is intended only for the addressee. It contains information that is legally privileged, confidential or otherwise protected from use or disclosure. If you are not the intended recipient, you are strictly prohibited from reviewing, disclosing, copying using or disseminating any of this information or taking any action in reliance on or regarding this information. If you have received this fax in error,  please notify us immediately by telephone so that we can arrange for its return to [place]. Phone: [phone], Toll-Free: [phone], Fax: [phone] Page: 2 of 2 Call Id: [id number] Guidelines Guideline Title Affirmed Question Affirmed Notes Nurse Date/Time Eilene Ghazi Time) not moving the arm (Exception: pain is clearly made worse by moving arm or bending neck) Disp. Time Eilene Ghazi Time) Disposition Final User 08/16/2019 11:37:59 AM Go to ED Now Yes Claiborne Billings, RN, Max Sane Disagree/Comply Disagree Caller Understands Yes PreDisposition InappropriateToAsk Care Advice Given Per Guideline GO TO ED NOW: * You need to be seen in the Emergency Department. * Go to the ED at ___________ [place] now. Drive carefully. NOTE TO TRIAGER - DRIVING: * Another adult should drive. CALL EMS IF: * The patient passes out, starts acting confused or becomes to weak to stand. BRING MEDICINES: * Please bring a list of your current medicines when you go to the Emergency Department (ER). CARE ADVICE given per Shoulder Pain (Adult) guideline Comments User: Suezanne Jacquet, RN Date/Time Eilene Ghazi Time): 08/16/2019 11:46:19 AM Caller refuses outcome of Go to ED Now, states she has an appt in office today at 4pm but isn't sure she can wait that long and wants to be seen in office instead of ER. Nurse called office per client directives, spoke with Janett Billow and informed her that caller refuses ER outcome, wants to be seen in office. Janett Billow states she will notify the doctor and will give pt a f/u call. Caller is informed and again strongly encouraged to go to ED Now due to her reported sxs; caller states she may end up having to go to ER but she will wait first for [facility]  to call back. Caller is crying in pain and states she is very nauseated. Caller advised again to go to ER to r/o cardiac issue and she states she will wait for Janett Billow to call. Referrals GO TO FACILITY REFUSED  Pt seen by Percell Mcelwee yesterday.

## 2019-08-17 NOTE — Progress Notes (Signed)
Cardiology Office Note   Date:  08/17/2019   ID:  Amber Huffman, DOB 1950-08-08, MRN [id number]  PCP:  Mackie Pai, PA-C  Cardiologist:  Skeet Latch, MD  Electrophysiologist:  None   Evaluation Performed:  Follow-Up Visit  Chief Complaint:  hyperlipidemia  History of Present Illness:    Amber Huffman is a 69 y.o. female with morbid obesity, hypertension and hyperlipidemia here for follow up.  She was initially seen in the hospital 10/2017 where she presented with chest pain.  She was diagnosed with hypertension and hyperlipidemia that admission.  She was started on hydrochlorothiazide.  Cardiac enzymes were negative and EKG was unremarkable.  Her chest pain was felt to be atypical and she was set up for outpatient stress testing.  She followed up with [name], DNP,  two weeks later.  She had not filled her medication as she is "not a medicine person."  She also rescheduled her testing.  She had an echo later that month that revealed LVEF 60 to 65% with grade 1 diastolic dysfunction.  She had a [employer] 11/2017 that revealed normal systolic function and no ischemia.  Since that time she was started on atorvastatin but did not tolerate it due to myalgias.  She followed up with our pharmacist and they recommended starting rosuvastatin 5 mg twice a week but she declined.  She wanted to work on diet and exercise.  At her last appointment [name] was doing well physically but was not exercising much due to COVID-19.  She was referred to the healthy weight and wellness clinic but hasn't been going.   She has been struggling with her gallbladder and was seen in the ED last month.  She has been struggling with pain in R arm.  She is working with her PCP.  She got a Toradol injection which helped quite a bit.  She was given flexeril but hasn't started it yet.  She hasn't been getting much exercise lately.  She is planning to start going to the [facility].  She is going to [facility]  and wants to be able to walk.   She struggles ith her diet.  She has been eating fast food and is stuck on caramel iced coffee from [employer].  She has been losing weight and is not sure why.  She is trying to get in with her GI doctor. She also has a mammogram pending.  She is considering having her gallbladder removed. She denies chest pain and her breathing is stable. She denies lower extremity edema, orthopnea or PND.     Past Medical History:  Diagnosis Date  . Hyperlipidemia   . Hypertension   . Morbid obesity with BMI of 50.0-59.9, adult [facility])    Past Surgical History:  Procedure Laterality Date  . ABDOMINAL HYSTERECTOMY       Current Meds  Medication Sig  . cyclobenzaprine (FLEXERIL) 5 MG tablet Take 1 tablet (5 mg total) by mouth at bedtime.  . famotidine (PEPCID) 20 MG tablet Take 1 tablet (20 mg total) by mouth daily.  . hydrochlorothiazide (HYDRODIURIL) 25 MG tablet Take 1 tablet (25 mg total) by mouth daily.  Marland Kitchen ibuprofen (ADVIL,MOTRIN) 800 MG tablet Take 800 mg by mouth daily as needed (pain).  . ondansetron (ZOFRAN) 4 MG tablet Take 1 tablet (4 mg total) by mouth every 6 (six) hours.  Marland Kitchen oxyCODONE-acetaminophen (PERCOCET/ROXICET) 5-325 MG tablet Take 1 tablet by mouth every 4 (four) hours as needed for severe pain.  . Vitamin  D, Ergocalciferol, (DRISDOL) 1.25 MG (50000 UT) CAPS capsule Take 1 capsule (50,000 Units total) by mouth every Thursday.     Allergies:   Penicillins   Social History   Tobacco Use  . Smoking status: Passive Smoke Exposure - Never Smoker  . Smokeless tobacco: Never Used  Substance Use Topics  . 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  . Drug use: Never     Family Hx: The patient's family history includes Heart disease in her father.  ROS:   Please see the history of present illness.    All other systems reviewed and  are negative.   Prior CV studies:   The following studies were reviewed today:  Echo 11/12/17: Study Conclusions  - Left ventricle: The cavity size was normal. There was moderate concentric hypertrophy. Systolic function was normal. The estimated ejection fraction was in the range of 60% to 65%. Wall motion was normal; there were no regional wall motion abnormalities. There was an increased relative contribution of atrial contraction to ventricular filling. Doppler parameters are consistent with abnormal left ventricular relaxation (grade 1 diastolic dysfunction). - Mitral valve: Calcified annulus. There was trivial regurgitation.  Lexiscan Myoview 11/13/17:  Nuclear stress EF: 81%.  The left ventricular ejection fraction is hyperdynamic (>65%).  There was no ST segment deviation noted during stress.  The study is normal.  This is a low risk study.  Normal resting and stress perfusion. No ischemia or infarction EF 81%  Labs/Other Tests and Data Reviewed:    EKG:  An ECG dated 07/19/19 was personally reviewed today and demonstrated:  sinus rhythm.  Rate 84 bpm  Recent Labs: 02/23/2019: TSH 2.62 07/27/2019: ALT 12; BUN 13; Creatinine, Ser 0.69; Hemoglobin 15.6; Platelets 314.0; Potassium 4.3; Sodium 137   Recent Lipid Panel Lab Results  Component Value Date/Time   CHOL 223 (H) 02/23/2019 01:15 PM   CHOL 213 (H) 02/18/2018 10:20 AM   TRIG 126.0 02/23/2019 01:15 PM   HDL 40.80 02/23/2019 01:15 PM   HDL 43 02/18/2018 10:20 AM   CHOLHDL 5 02/23/2019 01:15 PM   LDLCALC 157 (H) 02/23/2019 01:15 PM   LDLCALC 152 (H) 02/18/2018 10:20 AM    Wt Readings from Last 3 Encounters:  08/17/19 216 lb 3.2 oz (98.1 kg)  08/16/19 216 lb 3.2 oz (98.1 kg)  07/27/19 218 lb 9.6 oz (99.2 kg)     Objective:    VS:  BP 115/83   Pulse 93   Temp (!) 96.8 F (36 C)   Ht 4' 10.5" (1.486 m)   Wt 216 lb 3.2 oz (98.1 kg)   SpO2 98%   BMI 44.42 kg/m  , BMI Body mass  index is 44.42 kg/m. GENERAL:  Well appearing HEENT: Pupils equal round and reactive, fundi not visualized, oral mucosa unremarkable NECK:  No jugular venous distention, waveform within normal limits, carotid upstroke brisk and symmetric, no bruits LUNGS:  Clear to auscultation bilaterally HEART:  RRR.  PMI not displaced or sustained,S1 and S2 within normal limits, no S3, no S4, no clicks, no rubs, no murmurs ABD:  Flat, positive bowel sounds normal in frequency in pitch, no bruits, no rebound, no guarding, no midline pulsatile mass, no hepatomegaly, no splenomegaly EXT:  2 plus pulses throughout, no edema, no cyanosis no clubbing SKIN:  No rashes no nodules NEURO:  Cranial nerves II through  XII grossly intact, motor grossly intact throughout PSYCH:  Cognitively intact, oriented to person place and time  ASSESSMENT & PLAN:    # Atypical chest pain: Resolved.  Lexiscan Myoview negative 10/2017.  # Hypertension: BP controlled on HCTZ.  No changes.   # Morbid obesity: # Hyperlipidemia: Ms. Stotz previously tried atorvastatin and did not tolerate it. We recommended low dose intermittent rosuvastatin and she refused.  She continues to be inactive and eating fast food.  We discussed that she really needs to change both, but at least one.  She will work on walking three days per week on MWF.      Medication Adjustments/Labs and Tests Ordered: Current medicines are reviewed at length with the patient today.  Concerns regarding medicines are outlined above.   Tests Ordered: No orders of the defined types were placed in this encounter.   Medication Changes: No orders of the defined types were placed in this encounter.   Disposition:  Follow up in 6 month(s)  Signed, Skeet Latch, MD  08/17/2019 12:48 PM    [facility]

## 2019-08-19 ENCOUNTER — Telehealth: Payer: Self-pay | Admitting: Medical

## 2019-08-19 MED ORDER — CYCLOBENZAPRINE HCL 5 MG PO TABS: 5.0000 mg | ORAL_TABLET | Freq: Every day | ORAL | 0 refills | Status: DC

## 2019-08-19 NOTE — Telephone Encounter (Signed)
Caller: Shynia Call back # [phone]  Patient states flexeril is helping a lot and is wondering if you could send a prescription to the pharmacy.

## 2019-08-19 NOTE — Telephone Encounter (Signed)
Rx additional 3 days flexeril given. Let pt know I don't plan on giving this long term but hopefully will improve.

## 2019-08-31 ENCOUNTER — Ambulatory Visit: Payer: Self-pay | Admitting: General Surgery

## 2019-08-31 ENCOUNTER — Telehealth: Payer: Self-pay | Admitting: Cardiovascular Disease

## 2019-08-31 NOTE — Telephone Encounter (Signed)
   [facility] Pre-operative Risk Assessment     Request for surgical clearance:  1. What type of surgery is being performed? Gallbladder surgery   2. When is this surgery scheduled? TBD   3. What type of clearance is required (medical clearance vs. Pharmacy clearance to hold med vs. Both)? Medical    4. Are there any medications that need to be held prior to surgery and how long? NONE   5. Practice name and name of physician performing surgery? Greer Pickerel MD with [facility]   6. What is the office phone number? [phone]   7.   What is the office fax number? [phone] Attn: Dia Crawford RMA  8.   Anesthesia type (None, local, MAC, general) ? General    Sheral Apley M 08/31/2019, 11:27 AM  _________________________________________________________________   (provider comments below)

## 2019-08-31 NOTE — H&P (Signed)
Oswaldo Conroy Appointment: 08/31/2019 9:45 AM Location: [facility] Patient #: [id number] DOB: 1950/09/11 Married / Language: Cleophus Molt / Race: White Female  History of Present Illness Randall Hiss M. Ronold Hardgrove MD; 08/31/2019 10:33 AM) The patient is a 69 year old female who presents for evaluation of gall stones. She is referred by Mackie Pai PA-C for evaluation of gallstones. She ended up in the emergency room on July 6 after eating a large amount of shrimp. She states that she developed severe epigastric and right upper quadrant pain rating to her side with nausea. It was so intense that she couldn't tolerate it and she went to the emergency room. She underwent labs and ultrasound. She was found to have cholelithiasis without any evidence of cholecystitis. Her gallstone was 1.8 cm. Common bile duct was normal. She followed up with her primary care team and they referred her here. She hasn't really had any additional symptoms. She did develop right shoulder pain radiating down to her elbow. She describes it as a dull ache. This happened about 3 weeks after her gallbladder attack. She was given an injection which helped a little bit. She is still having some right shoulder achiness and is wondering if this referred pain from her gallbladder. She also saw her cardiologist on August 4 for routine follow-up. I reviewed all of these provider notes  She has had an abdominal hysterectomy and nephrectomy but denies other abdominal surgeries. She does get some dyspnea on exertion but no chest pain, chest pressure or chest tightness. No orthopnea. No TIAs or amaurosis fugax. No tobacco use. She does have someone that can stay with her on the day of surgery   Problem List/Past Medical Randall Hiss M. Redmond Pulling, MD; 08/31/2019 10:34 AM) SYMPTOMATIC CHOLELITHIASIS (K80.20) SEVERE OBESITY (E66.01)  Past Surgical History Darden [place], [place]; 08/31/2019 9:40 AM) Cesarean Section - 1 Hysterectomy  (not due to cancer) - Complete  Diagnostic Studies History Darden Palmer, RMA; 08/31/2019 9:40 AM) Colonoscopy >10 years ago Mammogram within last year Pap Smear >5 years ago  Allergies Darden Palmer, RMA; 08/31/2019 9:42 AM) Penicillin G Benzathine *PENICILLINS* Allergies Reconciled  Medication History Darden Palmer, RMA; 08/31/2019 9:43 AM) Cyclobenzaprine HCl (5MG  Tablet, Oral) Active. Famotidine (20MG  Tablet, Oral) Active. Ondansetron HCl (4MG  Tablet, Oral) Active. oxyCODONE-Acetaminophen (5-325MG  Tablet, Oral) Active. Vitamin D (Ergocalciferol) (1.25 MG(50000 UT) Capsule, Oral) Active. hydroCHLOROthiazide (25MG  Tablet, Oral) Active. Tylenol (500MG  Capsule, Oral) Active. Medications Reconciled  Social History Darden Palmer, [place]; 08/31/2019 9:40 AM) Alcohol use Remotely quit alcohol use. Caffeine use Carbonated beverages, Coffee, Tea. Illicit drug use Remotely quit drug use. Tobacco use Never smoker.  Family History Darden Palmer, [place]; 08/31/2019 9:40 AM) Alcohol Abuse Brother, Son. Cancer Mother. Depression Mother, Son. Heart Disease Father. Hypertension Father, Mother. Respiratory Condition Father.  Pregnancy / Birth History Darden Palmer, [place]; 08/31/2019 9:40 AM) Age at menarche 49 years. Age of menopause <43 Contraceptive History Intrauterine device, Oral contraceptives. Gravida 3 Length (months) of breastfeeding 3-6 Maternal age 37-25 Para 63  Other Problems Randall Hiss M. Redmond Pulling, MD; 08/31/2019 10:34 AM) Alcohol Abuse Anxiety Disorder Back Pain Bladder Problems Cholelithiasis Depression Gastroesophageal Reflux Disease Hypercholesterolemia Oophorectomy Bilateral. Transfusion history High blood pressure     Review of Systems Lattie Haw [place] RMA; 08/31/2019 9:40 AM) General Present- Weight Loss. Not Present- Appetite Loss, Chills, Fatigue, Fever, Night Sweats and Weight Gain. Skin Present- Rash. Not Present- Change in  Wart/Mole, Dryness, Hives, Jaundice, New Lesions, Non-Healing Wounds and Ulcer. HEENT Present- Hearing Loss, Ringing in the Ears and Wears  glasses/contact lenses. Not Present- Earache, Hoarseness, Nose Bleed, Oral Ulcers, Seasonal Allergies, Sinus Pain, Sore Throat, Visual Disturbances and Yellow Eyes. Respiratory Present- Snoring. Not Present- Bloody sputum, Chronic Cough, Difficulty Breathing and Wheezing. Breast Not Present- Breast Mass, Breast Pain, Nipple Discharge and Skin Changes. Cardiovascular Not Present- Chest Pain, Difficulty Breathing Lying Down, Leg Cramps, Palpitations, Rapid Heart Rate, Shortness of Breath and Swelling of Extremities. Gastrointestinal Present- Change in Bowel Habits and Gets full quickly at meals. Not Present- Abdominal Pain, Bloating, Bloody Stool, Chronic diarrhea, Constipation, Difficulty Swallowing, Excessive gas, Hemorrhoids, Indigestion, Nausea, Rectal Pain and Vomiting. Female Genitourinary Present- Frequency and Urgency. Not Present- Nocturia, Painful Urination and Pelvic Pain. Musculoskeletal Present- Back Pain, Joint Pain, Joint Stiffness and Muscle Pain. Not Present- Muscle Weakness and Swelling of Extremities. Psychiatric Not Present- Anxiety, Bipolar, Change in Sleep Pattern, Depression, Fearful and Frequent crying. Endocrine Not Present- Cold Intolerance, Excessive Hunger, Hair Changes, Heat Intolerance, Hot flashes and New Diabetes. Hematology Not Present- Blood Thinners, Easy Bruising, Excessive bleeding, Gland problems, HIV and Persistent Infections.  Vitals Lattie Haw [place] RMA; 08/31/2019 9:43 AM) 08/31/2019 9:43 AM Weight: 212.5 lb Height: 50in Body Surface Area: 1.68 m Body Mass Index: 59.76 kg/m  Temp.: 97.66F  Pulse: 115 (Regular)  P.OX: 95% (Room air) BP: 118/80(Sitting, Left Arm, Standard)        Physical Exam Randall Hiss M. Jona Erkkila MD; 08/31/2019 10:31 AM)  The physical exam findings are as follows: Note:severe  obesity  General Mental Status-Alert. General Appearance-Consistent with stated age. Hydration-Well hydrated. Voice-Normal.  Integumentary Note: chronic skin rash/irritation in abdominal skin fold - but no cellulitis;  Head and Neck Head-normocephalic, atraumatic with no lesions or palpable masses. Trachea-midline. Thyroid Gland Characteristics - normal size and consistency.  Eye Eyeball - Bilateral-Extraocular movements intact. Sclera/Conjunctiva - Bilateral-No scleral icterus.  Chest and Lung Exam Chest and lung exam reveals -quiet, even and easy respiratory effort with no use of accessory muscles and on auscultation, normal breath sounds, no adventitious sounds and normal vocal resonance. Inspection Chest Wall - Normal. Back - normal.  Breast - Did not examine.  Cardiovascular Cardiovascular examination reveals -normal heart sounds, regular rate and rhythm with no murmurs and normal pedal pulses bilaterally.  Abdomen Inspection Inspection of the abdomen reveals - No Hernias. Skin - Scar - no surgical scars. Palpation/Percussion Palpation and Percussion of the abdomen reveal - Soft, Non Tender, No Rebound tenderness, No Rigidity (guarding) and No hepatosplenomegaly. Auscultation Auscultation of the abdomen reveals - Bowel sounds normal.  Peripheral Vascular Upper Extremity Palpation - Pulses bilaterally normal.  Neurologic Neurologic evaluation reveals -alert and oriented x 3 with no impairment of recent or remote memory. Mental Status-Normal.  Neuropsychiatric The patient's mood and affect are described as -normal. Judgment and Insight-insight is appropriate concerning matters relevant to self.  Musculoskeletal Normal Exam - Left-Upper Extremity Strength Normal and Lower Extremity Strength Normal. Normal Exam - Right-Upper Extremity Strength Normal and Lower Extremity Strength Normal.  Lymphatic Head & Neck  General Head &  Neck Lymphatics: Bilateral - Description - Normal. Axillary - Did not examine. Femoral & Inguinal - Did not examine.    Assessment & Plan Randall Hiss M. Imagine Nest MD; 08/31/2019 10:34 AM)  SYMPTOMATIC CHOLELITHIASIS (K80.20) Impression: I believe the patient's symptoms are consistent with gallbladder disease.  We discussed gallbladder disease. The patient was given Neurosurgeon. We discussed non-operative and operative management. We discussed the signs & symptoms of acute cholecystitis  I discussed laparoscopic cholecystectomy with IOC in detail. The patient was given educational  material as well as diagrams detailing the procedure. We discussed the risks and benefits of a laparoscopic cholecystectomy including, but not limited to bleeding, infection, injury to surrounding structures such as the intestine or liver, bile leak, retained gallstones, need to convert to an open procedure, prolonged diarrhea, blood clots such as DVT, common bile duct injury, anesthesia risks, and possible need for additional procedures. We discussed the typical post-operative recovery course. I explained that the likelihood of improvement of their symptoms is good.  The patient has elected to proceed with surgery.  This patient encounter took 31 minutes today to perform the following: take history, perform exam, review outside records, interpret imaging, counsel the patient on their diagnosis and document encounter, findings & plan in the EHR  Current Plans Pt Education - Pamphlet Given - Laparoscopic Gallbladder Surgery: discussed with patient and provided information. You are being scheduled for surgery- Our schedulers will call you.  You should hear from our office's scheduling department within 5 working days about the location, date, and time of surgery. We try to make accommodations for patient's preferences in scheduling surgery, but sometimes the OR schedule or the surgeon's schedule prevents [place] from  making those accommodations.  If you have not heard from our office [phone]) in 5 working days, call the office and ask for your surgeon's nurse.  If you have other questions about your diagnosis, plan, or surgery, call the office and ask for your surgeon's nurse.  I recommended obtaining preoperative cardiac clearance. I am concerned about the health of the patient and the ability to tolerate the operation. Therefore, we will request clearance by cardiology to better assess operative risk & see if a reevaluation, further workup, etc is needed. Also recommendations on how medications such as for anticoagulation and blood pressure should be managed/held/restarted after surgery.  PRIMARY HYPERTENSION (I10) Impression: We will get cardiac clearance   SEVERE OBESITY (E66.01)  Leighton Ruff. Redmond Pulling, MD, FACS General, Bariatric, & Minimally Invasive Surgery [facility], [place]

## 2019-08-31 NOTE — Telephone Encounter (Signed)
   Primary Cardiologist: Skeet Latch, MD  Chart reviewed as part of pre-operative protocol coverage. Given past medical history and time since last visit, based on ACC/AHA guidelines, Amber Huffman would be at acceptable risk for the planned procedure without further cardiovascular testing.   I will route this recommendation to the requesting party via Epic fax function and remove from pre-op pool.  Please call with questions.  Jossie Ng. Hyman Crossan NP-C    08/31/2019, 11:49 AM [place] [address] Office [phone] Fax [phone]

## 2019-09-02 NOTE — Patient Instructions (Addendum)
DUE TO COVID-19 ONLY ONE VISITOR IS ALLOWED TO COME WITH YOU AND STAY IN THE WAITING ROOM ONLY DURING PRE OP AND PROCEDURE.   IF YOU WILL BE ADMITTED INTO THE HOSPITAL YOU ARE ALLOWED ONE SUPPORT PERSON DURING VISITATION HOURS ONLY (10AM -8PM)   . The support person may change daily. . The support person must pass our screening, gel in and out, and wear a mask at all times, including in the patient's room. . Patients must also wear a mask when staff or their support person are in the room.   COVID SWAB TESTING MUST BE COMPLETED ON:   Thursday, 09-08-19 @ 10:30    [address] [place],  [address]  (Must self quarantine after testing. Follow instructions on handout.)    Your procedure is scheduled on: Monday, 09-12-19   Report to [facility] Main  Entrance   Report to admitting at 11:30 AM   Call this number if you have problems the morning of surgery [phone]   Do not eat food :After Midnight.   May have liquids until  10:30 AM  day of surgery  CLEAR LIQUID DIET  Foods Allowed                                                                     Foods Excluded  Water, Black Coffee and tea, regular and decaf          liquids that you cannot  Plain Jell-O in any flavor  (No red)                                 see through such as: Fruit ices (not with fruit pulp)                                      milk, soups, orange juice              Iced Popsicles (No red)                                      All solid food                                   Apple juices Sports drinks like Gatorade (No red) Lightly seasoned clear broth or consume(fat free) Sugar, honey syrup     Oral Hygiene is also important to reduce your risk of infection.                                    Remember - BRUSH YOUR TEETH THE MORNING OF SURGERY WITH YOUR REGULAR TOOTHPASTE   Do NOT smoke after Midnight   Take these medicines the morning of surgery with A SIP OF WATER:  None  You may not have any metal on your body including hair pins, jewelry, and body piercings             Do not wear make-up, lotions, powders, perfumes/cologne, or deodorant             Do not wear nail polish.  Do not shave  48 hours prior to surgery.     Do not bring valuables to the hospital. [place].   Contacts, dentures or bridgework may not be worn into surgery.      Patients discharged the day of surgery will not be allowed to drive home.                Please read over the following fact sheets you were given: IF YOU HAVE QUESTIONS ABOUT YOUR PRE OP INSTRUCTIONS  PLEASE CALL [phone]   [place] - Preparing for Surgery Before surgery, you can play an important role.  Because skin is not sterile, your skin needs to be as free of germs as possible.  You can reduce the number of germs on your skin by washing with CHG (chlorahexidine gluconate) soap before surgery.  CHG is an antiseptic cleaner which kills germs and bonds with the skin to continue killing germs even after washing. Please DO NOT use if you have an allergy to CHG or antibacterial soaps.  If your skin becomes reddened/irritated stop using the CHG and inform your nurse when you arrive at Short Stay. Do not shave (including legs and underarms) for at least 48 hours prior to the first CHG shower.  You may shave your face/neck.  Please follow these instructions carefully:  1.  Shower with CHG Soap the night before surgery and the  morning of surgery.  2.  If you choose to wash your hair, wash your hair first as usual with your normal  shampoo.  3.  After you shampoo, rinse your hair and body thoroughly to remove the shampoo.                             4.  Use CHG as you would any other liquid soap.  You can apply chg directly to the skin and wash.  Gently with a scrungie or clean washcloth.  5.  Apply the CHG Soap to your body ONLY FROM THE NECK DOWN.   Do   not  use on face/ open                           Wound or open sores. Avoid contact with eyes, ears mouth and   genitals (private parts).                       Wash face,  Genitals (private parts) with your normal soap.             6.  Wash thoroughly, paying special attention to the area where your    surgery  will be performed.  7.  Thoroughly rinse your body with warm water from the neck down.  8.  DO NOT shower/wash with your normal soap after using and rinsing off the CHG Soap.                9.  Pat yourself dry with a clean towel.  10.  Wear clean pajamas.            11.  Place clean sheets on your bed the night of your first shower and do not  sleep with pets. Day of Surgery : Do not apply any lotions/deodorants the morning of surgery.  Please wear clean clothes to the hospital/surgery center.  FAILURE TO FOLLOW THESE INSTRUCTIONS MAY RESULT IN THE CANCELLATION OF YOUR SURGERY  PATIENT SIGNATURE_________________________________  NURSE SIGNATURE__________________________________  ________________________________________________________________________

## 2019-09-02 NOTE — Progress Notes (Addendum)
COVID Vaccine Completed:  X1 Date COVID Vaccine completed:   COVID vaccine manufacturer: Overly   PCP - Mackie Pai, PA-C Cardiologist - Skeet Latch, MD last OV 08-17-19  Clearance dated 08-31-19 in Epic  Chest x-ray - 02-23-19 in Epic EKG - 07-19-19 in epic Stress Test - 11-13-17 in Epic ECHO - 11-12-17 in Epic Cardiac Cath -   Sleep Study -  CPAP -   Fasting Blood Sugar -  Checks Blood Sugar _____ times a day  Blood Thinner Instructions: Aspirin Instructions: Last Dose:  Anesthesia review:  Hx of atypical chest pain  Patient c/o shortness of breath with exertion, ie long walks otherwise no issues.    No fever, cough and chest pain at PAT appointment   Patient verbalized understanding of instructions that were given to them at the PAT appointment. Patient was also instructed that they will need to review over the PAT instructions again at home before surgery.

## 2019-09-05 ENCOUNTER — Telehealth: Payer: Self-pay

## 2019-09-05 NOTE — Telephone Encounter (Signed)
Called pt.  Moved her from Tuesday 8-24 with Amber Huffman to Wednesday 8-25 at 1:30pm with Amber Huffman b/c Amber Huffman will not be in the office tomorrow

## 2019-09-06 ENCOUNTER — Ambulatory Visit: Payer: Medicare Other | Admitting: Gastroenterology

## 2019-09-07 ENCOUNTER — Other Ambulatory Visit (INDEPENDENT_AMBULATORY_CARE_PROVIDER_SITE_OTHER): Payer: Medicare Other

## 2019-09-07 ENCOUNTER — Telehealth: Payer: Self-pay | Admitting: Medical

## 2019-09-07 ENCOUNTER — Encounter: Payer: Self-pay | Admitting: Physician Assistant

## 2019-09-07 ENCOUNTER — Ambulatory Visit (INDEPENDENT_AMBULATORY_CARE_PROVIDER_SITE_OTHER): Payer: Medicare Other | Admitting: Physician Assistant

## 2019-09-07 ENCOUNTER — Telehealth: Payer: Self-pay

## 2019-09-07 VITALS — BP 124/82 | HR 104 | Ht <= 58 in | Wt 211.8 lb

## 2019-09-07 DIAGNOSIS — E119 Type 2 diabetes mellitus without complications: Secondary | ICD-10-CM

## 2019-09-07 DIAGNOSIS — Z1211 Encounter for screening for malignant neoplasm of colon: Secondary | ICD-10-CM

## 2019-09-07 DIAGNOSIS — R1013 Epigastric pain: Secondary | ICD-10-CM | POA: Diagnosis not present

## 2019-09-07 DIAGNOSIS — R634 Abnormal weight loss: Secondary | ICD-10-CM | POA: Diagnosis not present

## 2019-09-07 LAB — HEMOGLOBIN A1C: Hgb A1c MFr Bld: 6.4 % (ref 4.6–6.5)

## 2019-09-07 MED ORDER — SUPREP BOWEL PREP KIT 17.5-3.13-1.6 GM/177ML PO SOLN: 1.0000 | ORAL | 0 refills | Status: DC

## 2019-09-07 NOTE — Telephone Encounter (Signed)
Pt.notified

## 2019-09-07 NOTE — Progress Notes (Signed)
Chief Complaint: Weight loss  HPI:    [name] is a 69 year old female, known to Dr. Lyndel Safe, with a past medical history as listed below, who was referred to me by Mackie Pai, PA-C for a complaint of weight loss.      08/25/2007 colonoscopy with terminal ileum intubation by Dr. Lyndel Safe with mild sigmoid diverticulosis and small internal hemorrhoids.  Otherwise normal.  Repeat recommended in 10 years.    07/19/2019 ultrasound right upper quadrant showed dilated gallbladder with cholelithiasis and sludge.  Increased liver echogenicity likely reflecting steatosis.    07/27/2019 patient saw PCP in regards to right upper quadrant abdominal pain.  She had an ultrasound from the ED which showed stones and sludge and was thought to have symptomatic cholelithiasis.  She is scheduled for laparoscopic cholecystectomy 09/12/2019.    07/27/2019 CBC, CMP and lipase are normal.    Today, the patient presents to clinic and explains that she is about to have gallbladder surgery and wants to make sure there is nothing else going on before they "go poking holes in there".  She specifically has questions in regards to her pancreas.  Tells me that most concerning to her is that she has had about a 30 pound weight loss over the past year and 3 months without trying.  Also describes that she occasionally has pain in her back, "middle upper", typically this is only when she is moving though (she does walk with a cane).  Does tell me the diameter of her stools has seemed to change but overall they are consistent with no blood.  Also tells me her PCP is concerned about possible silent reflux and wanted her to try Pepcid but "I really do not like to take medicine unless I need it".  Patient would like to schedule her colonoscopy.    Denies fever, chills, weight loss or blood in her stool.  Past Medical History:  Diagnosis Date  . Hyperlipidemia   . Hypertension   . Morbid obesity with BMI of 50.0-59.9, adult [facility])      Past Surgical History:  Procedure Laterality Date  . ABDOMINAL HYSTERECTOMY      Current Outpatient Medications  Medication Sig Dispense Refill  . acetaminophen (TYLENOL) 500 MG tablet Take 1,000 mg by mouth every 6 (six) hours as needed (shoulder pain.).    Marland Kitchen cyclobenzaprine (FLEXERIL) 5 MG tablet Take 1 tablet (5 mg total) by mouth at bedtime. (Patient not taking: Reported on 09/05/2019) 3 tablet 0  . famotidine (PEPCID) 20 MG tablet Take 1 tablet (20 mg total) by mouth daily. (Patient not taking: Reported on 09/05/2019) 30 tablet 0  . hydrochlorothiazide (HYDRODIURIL) 25 MG tablet Take 1 tablet (25 mg total) by mouth daily. (Patient taking differently: Take 25 mg by mouth every evening. ) 90 tablet 3  . ibuprofen (ADVIL,MOTRIN) 800 MG tablet Take 800 mg by mouth every 8 (eight) hours as needed (pain).     . ondansetron (ZOFRAN) 4 MG tablet Take 1 tablet (4 mg total) by mouth every 6 (six) hours. (Patient not taking: Reported on 09/05/2019) 12 tablet 0  . oxyCODONE-acetaminophen (PERCOCET/ROXICET) 5-325 MG tablet Take 1 tablet by mouth every 4 (four) hours as needed for severe pain. (Patient not taking: Reported on 09/05/2019) 12 tablet 0  . Vitamin D, Ergocalciferol, (DRISDOL) 1.25 MG (50000 UT) CAPS capsule Take 1 capsule (50,000 Units total) by mouth every Thursday. (Patient taking differently: Take 50,000 Units by mouth every 14 (fourteen) days. Wednesdays) 5 capsule 5  No current facility-administered medications for this visit.    Allergies as of 09/07/2019 - Review Complete 09/05/2019  Allergen Reaction Noted  . Penicillins Rash 10/24/2017    Family History  Problem Relation Age of Onset  . Heart disease Father        After the age of 82    Social History   Socioeconomic History  . Marital status: Married    Spouse name: Not on file  . Number of children: Not on file  . Years of education: Not on file  . Highest education level: Not on file  Occupational History  .  Not on file  Tobacco Use  . Smoking status: Passive Smoke Exposure - Never Smoker  . Smokeless tobacco: Never Used  Substance and Sexual Activity  . 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  . Drug use: Never  . Sexual activity: Not on file  Other Topics Concern  . Not on file  Social History Narrative  . Not on file   Social Determinants of Health   Financial Resource Strain:   . Difficulty of Paying Living Expenses: Not on file  Food Insecurity:   . Worried About Charity fundraiser in the Last Year: Not on file  . Ran Out of Food in the Last Year: Not on file  Transportation Needs:   . Lack of Transportation (Medical): Not on file  . Lack of Transportation (Non-Medical): Not on file  Physical Activity:   . Days of Exercise per Week: Not on file  . Minutes of Exercise per Session: Not on file  Stress:   . Feeling of Stress : Not on file  Social Connections:   . Frequency of Communication with Friends and Family: Not on file  . Frequency of Social Gatherings with Friends and Family: Not on file  . Attends Religious Services: Not on file  . Active Member of Clubs or Organizations: Not on file  . Attends Archivist Meetings: Not on file  . Marital Status: Not on file  Intimate Partner Violence:   . Fear of Current or Ex-Partner: Not on file  . Emotionally Abused: Not on file  . Physically Abused: Not on file  . Sexually Abused: Not on file    Review of Systems:    Constitutional: No weight loss, fever or chills Skin: No rash  Cardiovascular: No chest pain Respiratory: No SOB  Gastrointestinal: See HPI and otherwise negative Genitourinary: No dysuria  Neurological: No headache, dizziness or syncope Musculoskeletal: No new muscle or joint pain Hematologic: No bleeding Psychiatric: No history of depression or anxiety   Physical Exam:   Vital signs: BP 124/82 (BP Location: Right Wrist, Patient Position: Sitting)   Pulse (!) 104   Ht 4\' 10"  (1.473 m)   Wt 211 lb 12.8 oz (96.1 kg)   SpO2 98%   BMI 44.27 kg/m   Constitutional:   Pleasant morbidly obese Caucasian female appears to be in NAD, Well developed, Well nourished, alert and cooperative Head:  Normocephalic and atraumatic. Eyes:   PEERL, EOMI. No icterus. Conjunctiva pink. Ears:  Normal auditory acuity. Neck:  Supple Throat: Oral cavity and pharynx without inflammation, swelling or lesion.  Respiratory: Respirations even and unlabored. Lungs clear to auscultation bilaterally.   No wheezes, crackles, or rhonchi.  Cardiovascular: Normal S1, S2. No MRG. Regular rate and rhythm.  No peripheral edema, cyanosis or pallor.  Gastrointestinal:  Soft, nondistended, mild epigastric ttp. No rebound or guarding. Normal bowel sounds. No appreciable masses or hepatomegaly. Rectal:  Not performed.  Msk:  Symmetrical without gross deformities. Without edema, no deformity or joint abnormality. Ambulates with cane Neurologic:  Alert and  oriented x4;  grossly normal neurologically.  Skin:   Dry and intact without significant lesions or rashes. Psychiatric: Demonstrates good judgement and reason without abnormal affect or behaviors.  RELEVANT LABS AND IMAGING: CBC    Component Value Date/Time   WBC 9.8 07/27/2019 1043   RBC 5.66 (H) 07/27/2019 1043   HGB 15.6 (H) 07/27/2019 1043   HGB 15.1 11/04/2017 1208   HCT 47.2 (H) 07/27/2019 1043   HCT 45.4 11/04/2017 1208   PLT 314.0 07/27/2019 1043   PLT 345 11/04/2017 1208   MCV 83.3 07/27/2019 1043   MCV 82 11/04/2017 1208   MCH 27.1 07/19/2019 0825   MCHC 33.1 07/27/2019 1043   RDW 14.7 07/27/2019 1043   RDW 14.2 11/04/2017 1208   LYMPHSABS 2.3 07/27/2019 1043   MONOABS 0.8 07/27/2019 1043   EOSABS 0.4 07/27/2019 1043   BASOSABS 0.1 07/27/2019 1043    CMP     Component Value Date/Time   NA 137 07/27/2019 1043   NA 141  12/23/2017 0852   K 4.3 07/27/2019 1043   CL 96 07/27/2019 1043   CO2 34 (H) 07/27/2019 1043   GLUCOSE 113 (H) 07/27/2019 1043   BUN 13 07/27/2019 1043   BUN 18 12/23/2017 0852   CREATININE 0.69 07/27/2019 1043   CALCIUM 9.4 07/27/2019 1043   PROT 6.8 07/27/2019 1043   PROT 6.9 12/23/2017 0852   ALBUMIN 4.0 07/27/2019 1043   ALBUMIN 4.3 12/23/2017 0852   AST 14 07/27/2019 1043   ALT 12 07/27/2019 1043   ALKPHOS 77 07/27/2019 1043   BILITOT 0.5 07/27/2019 1043   BILITOT 0.3 12/23/2017 0852   GFRNONAA >60 07/19/2019 0825   GFRAA >60 07/19/2019 0825    Assessment: 1.  Epigastric/right upper quadrant pain: Likely this is mostly related to her gallbladder with symptomatic cholelithiasis as previously diagnosed, but could also consider gastric origin which could be contributing 2.  Unexplained weight loss: About 30 pounds over the past year and 3 months 3.  Screening for colorectal cancer: Last colonoscopy 12 years ago  Plan: 1.  Scheduled patient for a screening colonoscopy and a diagnostic EGD given weight loss with Dr. Lyndel Safe in the [facility].  Did discuss risks, benefits, limitations and alternatives and patient agrees to proceed. 2.  Discussed with patient that I am not seeing any reason for her to delay her upcoming laparoscopic cholecystectomy on August 30.  Reviewed recent labs and imaging with her. 3.  Patient to follow in clinic per recommendations from Dr. Lyndel Safe after time of procedures.  Ellouise Newer, PA-C [facility] 09/07/2019, 1:43 PM  Cc: Mackie Pai, PA-C

## 2019-09-07 NOTE — Telephone Encounter (Signed)
Placed a1c to be done at [facility]. Is pt set up to see cardilogist. I remember seeing in note that they want cardiac clearance for potential surgery

## 2019-09-07 NOTE — Patient Instructions (Signed)
If you are age 71 or older, your body mass index should be between 23-30. Your Body mass index is 44.27 kg/m. If this is out of the aforementioned range listed, please consider follow up with your Primary Care Provider.  If you are age 20 or younger, your body mass index should be between 19-25. Your Body mass index is 44.27 kg/m. If this is out of the aformentioned range listed, please consider follow up with your Primary Care Provider.   We have sent the following medications to your pharmacy for you to pick up at your convenience: [place] have been scheduled for an endoscopy and colonoscopy. Please follow the written instructions given to you at your visit today. Please pick up your prep supplies at the pharmacy within the next 1-3 days. If you use inhalers (even only as needed), please bring them with you on the day of your procedure.   Thank you for choosing me and [facility].  Dennison Bulla

## 2019-09-07 NOTE — Telephone Encounter (Signed)
Make sure she is well hydrated before the lab. No guarantee [place] would not have difficulty drawing lab?

## 2019-09-07 NOTE — Telephone Encounter (Signed)
Patient states she needs a current a1c for her pre-op surgery and wants the labs drawn at [facility] and wants to come pick up a prescription so she can get labs  drawn there but she doesn't want to go to [place] lab because she fears she will have to get labs drawn twice

## 2019-09-08 ENCOUNTER — Ambulatory Visit: Payer: Medicare Other | Admitting: Physician Assistant

## 2019-09-08 ENCOUNTER — Other Ambulatory Visit (HOSPITAL_COMMUNITY): Payer: Medicare Other

## 2019-09-08 ENCOUNTER — Other Ambulatory Visit: Payer: Self-pay

## 2019-09-08 ENCOUNTER — Encounter (HOSPITAL_COMMUNITY): Payer: Self-pay

## 2019-09-08 ENCOUNTER — Encounter (HOSPITAL_COMMUNITY)
Admission: RE | Admit: 2019-09-08 | Discharge: 2019-09-08 | Disposition: A | Discharge status: Home / Self Care | Payer: Medicare Other | Source: Ambulatory Visit | Attending: General Surgery | Admitting: General Surgery

## 2019-09-08 ENCOUNTER — Encounter (HOSPITAL_COMMUNITY): Payer: Self-pay | Admitting: Certified Registered Nurse Anesthetist

## 2019-09-08 ENCOUNTER — Other Ambulatory Visit (HOSPITAL_COMMUNITY)
Admission: RE | Admit: 2019-09-08 | Discharge: 2019-09-08 | Disposition: A | Discharge status: Home / Self Care | Payer: Medicare Other | Source: Ambulatory Visit | Attending: General Surgery | Admitting: General Surgery

## 2019-09-08 ENCOUNTER — Encounter (HOSPITAL_COMMUNITY): Payer: Self-pay | Admitting: Physician Assistant

## 2019-09-08 DIAGNOSIS — Z20822 Contact with and (suspected) exposure to covid-19: Secondary | ICD-10-CM | POA: Insufficient documentation

## 2019-09-08 DIAGNOSIS — Z01812 Encounter for preprocedural laboratory examination: Secondary | ICD-10-CM | POA: Insufficient documentation

## 2019-09-08 HISTORY — DX: Unspecified osteoarthritis, unspecified site: M19.90

## 2019-09-08 HISTORY — DX: Anemia, unspecified: D64.9

## 2019-09-08 HISTORY — DX: Pneumonia, unspecified organism: J18.9

## 2019-09-08 LAB — BASIC METABOLIC PANEL
Anion gap: 11 (ref 5–15)
BUN: 17 mg/dL (ref 8–23)
CO2: 31 mmol/L (ref 22–32)
Calcium: 9.1 mg/dL (ref 8.9–10.3)
Chloride: 93 mmol/L — ABNORMAL LOW (ref 98–111)
Creatinine, Ser: 0.64 mg/dL (ref 0.44–1.00)
GFR calc Af Amer: >60 mL/min (ref >60)
GFR calc non Af Amer: >60 mL/min (ref >60)
Glucose, Bld: 124 mg/dL — ABNORMAL HIGH (ref 70–99)
Potassium: 3.4 mmol/L — ABNORMAL LOW (ref 3.5–5.1)
Sodium: 135 mmol/L (ref 135–145)

## 2019-09-08 LAB — CBC
HCT: 49.4 % — ABNORMAL HIGH (ref 36.0–46.0)
Hemoglobin: 15.7 g/dL — ABNORMAL HIGH (ref 12.0–15.0)
MCH: 27 pg (ref 26.0–34.0)
MCHC: 31.8 g/dL (ref 30.0–36.0)
MCV: 85 fL (ref 80.0–100.0)
Platelets: 373 10*3/uL (ref 150–400)
RBC: 5.81 MIL/uL — ABNORMAL HIGH (ref 3.87–5.11)
RDW: 14.9 % (ref 11.5–15.5)
WBC: 9.1 10*3/uL (ref 4.0–10.5)
nRBC: 0 % (ref 0.0–0.2)

## 2019-09-08 LAB — SARS CORONAVIRUS 2 (TAT 6-24 HRS): SARS Coronavirus 2: NEGATIVE

## 2019-09-08 IMAGING — CR DG CHEST 2V
2 series · 2 of 2 positions shown · non-contrast
Comparison: Chest x-ray dated February 26, 2016.

CLINICAL DATA: Chest pain for the past 2 days.

EXAM:
CHEST - 2 VIEW

[w chest pa]
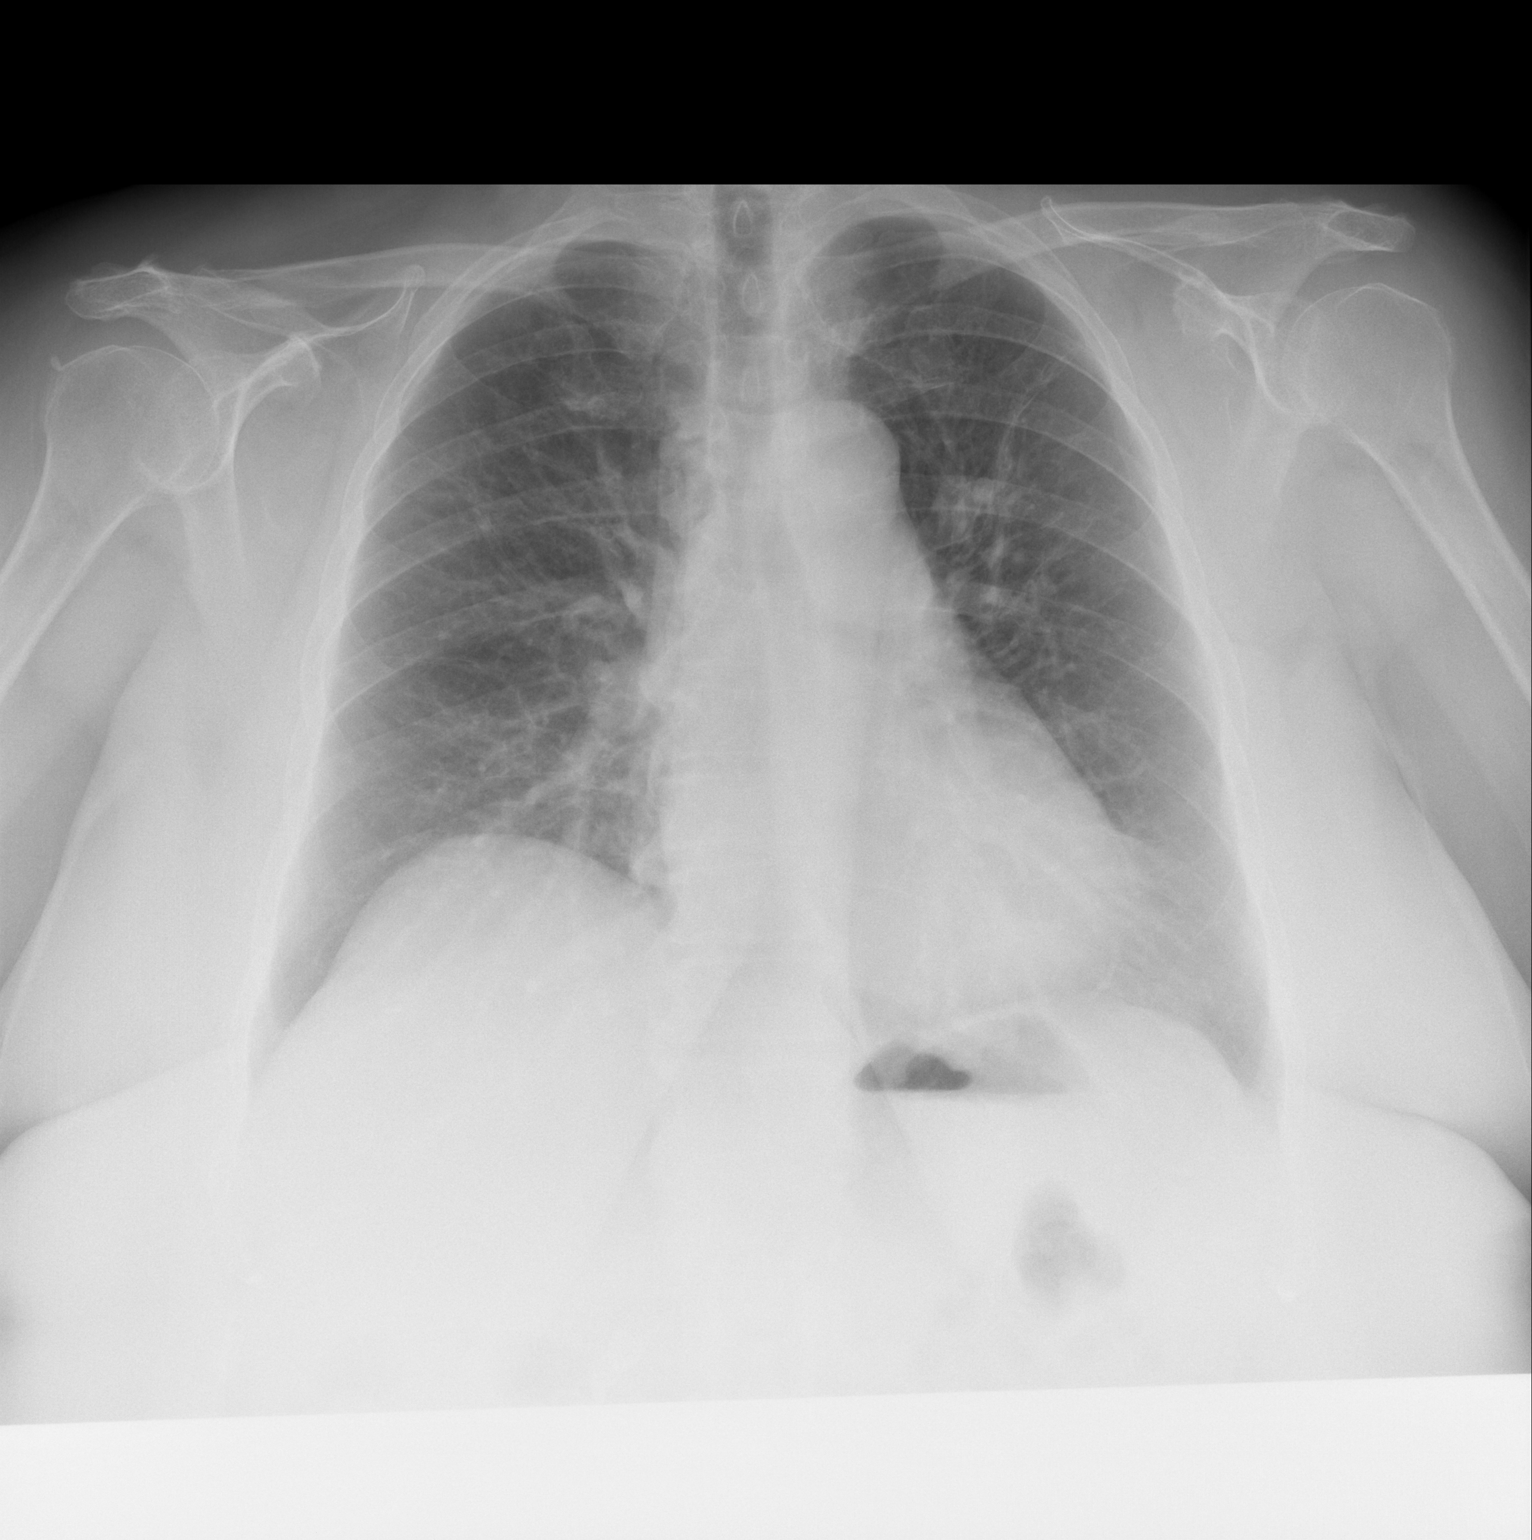

[w chest lat]
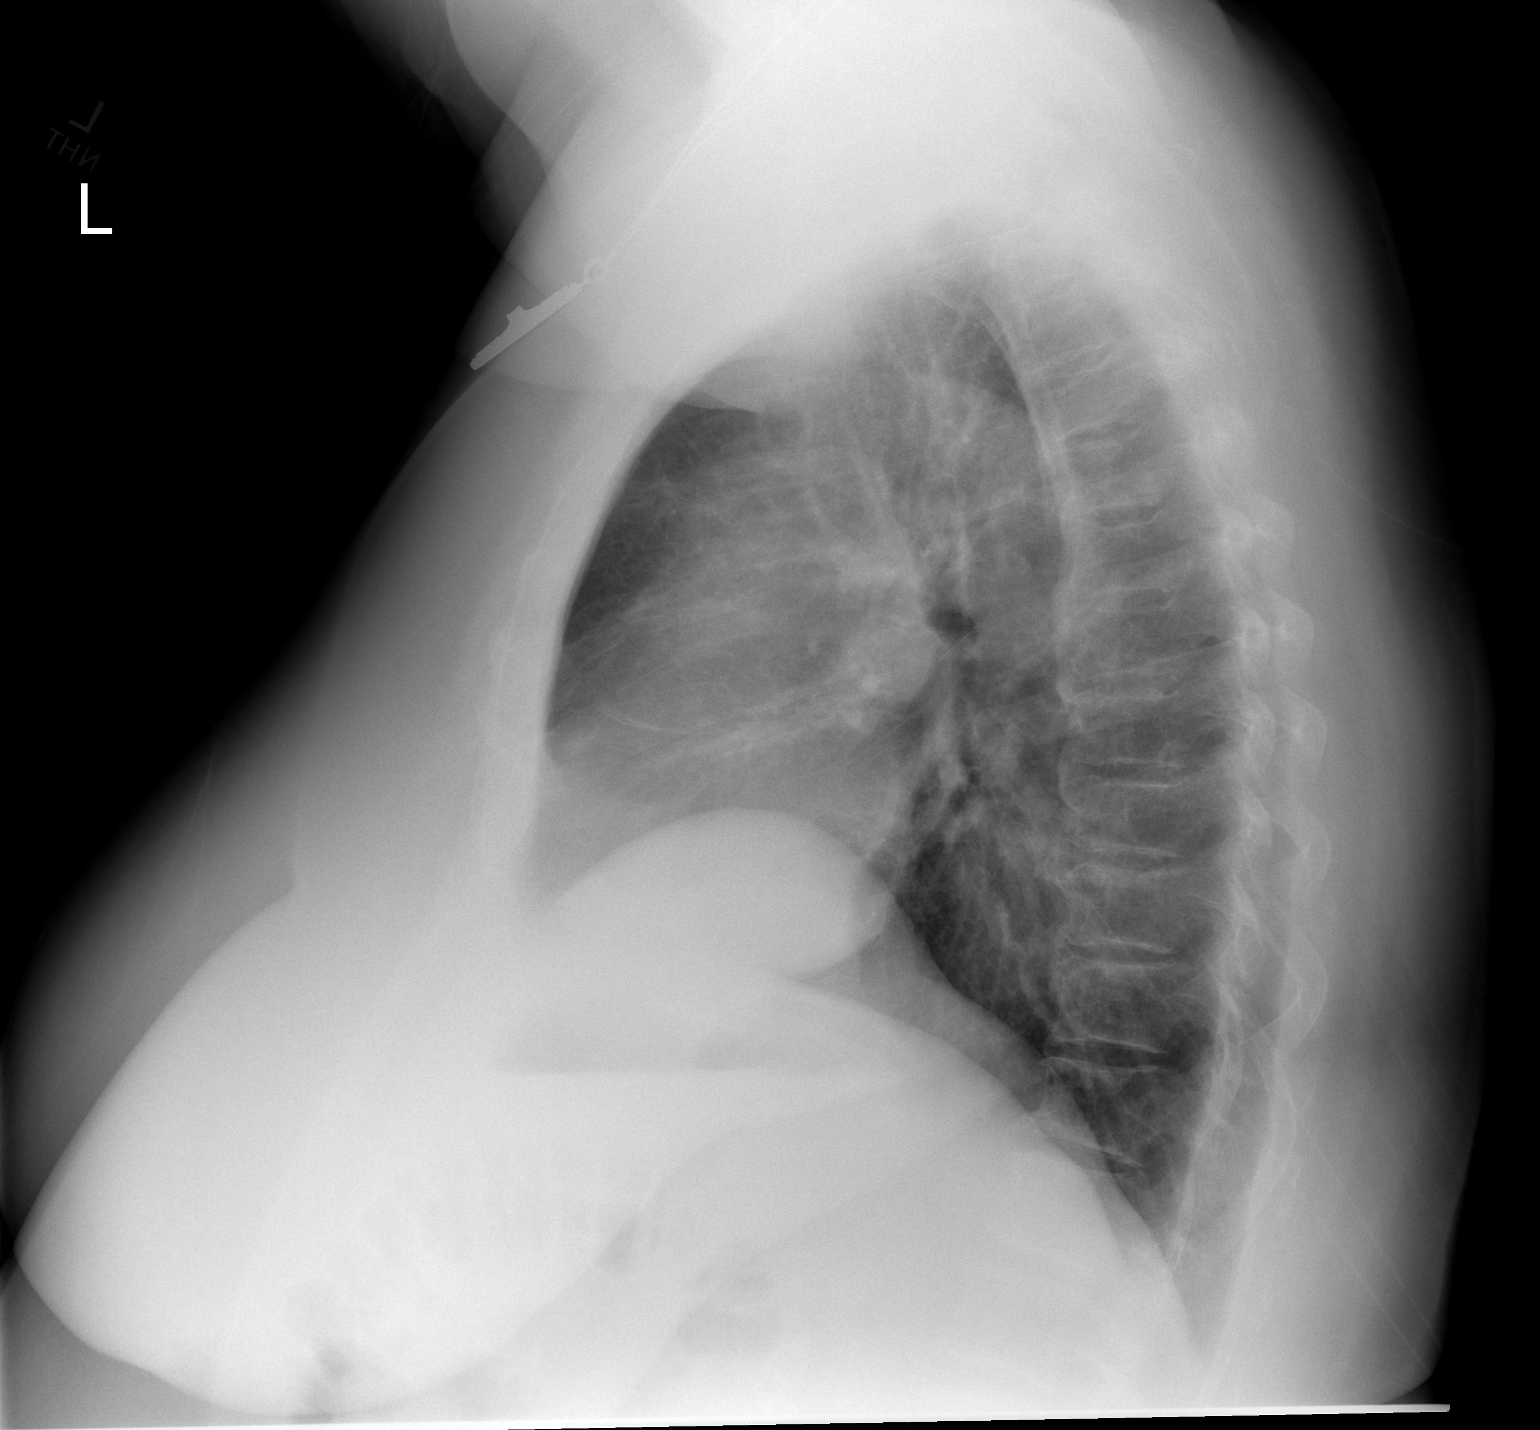

[2 of 2 positions shown; findings below may reference images not displayed]

FINDINGS: The heart size and mediastinal contours are within normal limits.
Normal pulmonary vascularity. Scattered pleuroparenchymal scarring
is unchanged. No focal consolidation, pleural effusion, or
pneumothorax. Unchanged eventration of the right hemidiaphragm. No
acute osseous abnormality.
IMPRESSION: No active cardiopulmonary disease.

## 2019-09-08 NOTE — Progress Notes (Signed)
Anesthesia Chart Review   Case: [id number] Date/Time: 09/12/19 1320   Procedure: LAPAROSCOPIC CHOLECYSTECTOMY WITH POSSIBLE INTRAOPERATIVE CHOLANGIOGRAM (N/A )   Anesthesia type: General   Pre-op diagnosis: SYMPTOMATIC CHOLELITHIASIS   Location: [place] 02 / [facility]   Surgeons: Greer Pickerel, MD      DISCUSSION:69 y.o. with h/o HTN, HLD, symptomatic cholelithiasis scheduled for above procedure 09/12/2019 with Dr. Greer Pickerel.   Per cariology preoperative risk assessment, "Chart reviewed as part of pre-operative protocol coverage. Given past medical history and time since last visit, based on ACC/AHA guidelines, Amber Huffman would be at acceptable risk for the planned procedure without further cardiovascular testing.  I will route this recommendation to the requesting party via Epic fax function and remove from pre-op pool."  Anticipate pt can proceed with planned procedure barring acute status change.   VS: BP 127/63   Pulse 88   Temp 36.8 C (Oral)   Resp 16   Ht $R'4\' 10"'CV$  (1.473 m)   Wt 95.7 kg   SpO2 95%   BMI 44.10 kg/m   PROVIDERS: Saguier, Percell Grisby, PA-C is PCP   Skeet Latch, MD is Cardiologist  LABS: Labs reviewed: Acceptable for surgery. (all labs ordered are listed, but only abnormal results are displayed)  Labs Reviewed  BASIC METABOLIC PANEL - Abnormal; Notable for the following components:      Result Value   Potassium 3.4 (*)    Chloride 93 (*)    Glucose, Bld 124 (*)    All other components within normal limits  CBC - Abnormal; Notable for the following components:   RBC 5.81 (*)    Hemoglobin 15.7 (*)    HCT 49.4 (*)    All other components within normal limits     IMAGES:   EKG: 07/19/2019 Rate 84 bpm Sinus rhythm  Borderline left axis deviation   CV: Myocardial Perfusion 11/13/2017  Nuclear stress EF: 81%.  The left ventricular ejection fraction is hyperdynamic (>65%).  There was no ST segment deviation noted during stress.  The study is  normal.  This is a low risk study.   Normal resting and stress perfusion. No ischemia or infarction EF 81%  Echo 11/12/2017 Study Conclusions   - Left ventricle: The cavity size was normal. There was moderate  concentric hypertrophy. Systolic function was normal. The  estimated ejection fraction was in the range of 60% to 65%. Wall  motion was normal; there were no regional wall motion  abnormalities. There was an increased relative contribution of  atrial contraction to ventricular filling. Doppler parameters are  consistent with abnormal left ventricular relaxation (grade 1  diastolic dysfunction).  - Mitral valve: Calcified annulus. There was trivial regurgitation.   Past Medical History:  Diagnosis Date  . Anemia   . Arthritis    Thumbs  . Hyperlipidemia   . Hypertension   . Morbid obesity with BMI of 50.0-59.9, adult ([place])   . Pneumonia     Past Surgical History:  Procedure Laterality Date  . ABDOMINAL HYSTERECTOMY    . COLONOSCOPY    . OOPHORECTOMY    . WISDOM TOOTH EXTRACTION      MEDICATIONS: . acetaminophen (TYLENOL) 500 MG tablet  . famotidine (PEPCID) 20 MG tablet  . hydrochlorothiazide (HYDRODIURIL) 25 MG tablet  . ibuprofen (ADVIL,MOTRIN) 800 MG tablet  . Na Sulfate-K Sulfate-Mg Sulf (SUPREP BOWEL PREP KIT) 17.5-3.13-1.6 GM/177ML SOLN  . ondansetron (ZOFRAN) 4 MG tablet  . oxyCODONE-acetaminophen (PERCOCET/ROXICET) 5-325 MG tablet  . Vitamin D,  Ergocalciferol, (DRISDOL) 1.25 MG (50000 UT) CAPS capsule   No current facility-administered medications for this encounter.     Konrad Felix, PA-C [facility] Pre-Surgical Testing [phone]

## 2019-09-11 NOTE — Progress Notes (Signed)
Agree with the excellent plan RG

## 2019-09-12 ENCOUNTER — Ambulatory Visit (HOSPITAL_COMMUNITY)
Admission: RE | Admit: 2019-09-12 | Discharge: 2019-09-12 | Disposition: A | Discharge status: Home / Self Care | Payer: Medicare Other | Attending: General Surgery | Admitting: General Surgery

## 2019-09-12 ENCOUNTER — Ambulatory Visit: Payer: Self-pay | Admitting: General Surgery

## 2019-09-12 ENCOUNTER — Encounter (HOSPITAL_COMMUNITY)
Admission: RE | Disposition: A | Discharge status: Home / Self Care | Payer: Self-pay | Source: Home / Self Care | Attending: General Surgery

## 2019-09-12 ENCOUNTER — Telehealth (HOSPITAL_COMMUNITY): Payer: Self-pay | Admitting: *Deleted

## 2019-09-12 DIAGNOSIS — K8020 Calculus of gallbladder without cholecystitis without obstruction: Secondary | ICD-10-CM | POA: Diagnosis present

## 2019-09-12 DIAGNOSIS — Z539 Procedure and treatment not carried out, unspecified reason: Secondary | ICD-10-CM | POA: Diagnosis not present

## 2019-09-12 SURGERY — LAPAROSCOPIC CHOLECYSTECTOMY WITH INTRAOPERATIVE CHOLANGIOGRAM
Anesthesia: General

## 2019-09-12 MED ORDER — ACETAMINOPHEN 500 MG PO TABS: 1000.0000 mg | ORAL_TABLET | ORAL | Status: DC

## 2019-09-12 MED ORDER — CHLORHEXIDINE GLUCONATE 0.12 % MT SOLN: 15.0000 mL | Freq: Once | OROMUCOSAL | Status: DC

## 2019-09-12 MED ORDER — ORAL CARE MOUTH RINSE: 15.0000 mL | Freq: Once | OROMUCOSAL | Status: DC

## 2019-09-12 MED ORDER — GABAPENTIN 100 MG PO CAPS: 100.0000 mg | ORAL_CAPSULE | ORAL | Status: DC

## 2019-09-12 MED ORDER — CIPROFLOXACIN IN D5W 400 MG/200ML IV SOLN
INTRAVENOUS | Status: AC
  Filled 2019-09-12: qty 200

## 2019-09-12 MED ORDER — CHLORHEXIDINE GLUCONATE CLOTH 2 % EX PADS: 6.0000 | MEDICATED_PAD | Freq: Once | CUTANEOUS | Status: DC

## 2019-09-12 MED ORDER — LACTATED RINGERS IV SOLN: INTRAVENOUS | Status: DC

## 2019-09-12 MED ORDER — CIPROFLOXACIN IN D5W 400 MG/200ML IV SOLN: 400.0000 mg | INTRAVENOUS | Status: DC

## 2019-09-12 MED ORDER — ACETAMINOPHEN 500 MG PO TABS
ORAL_TABLET | ORAL | Status: AC
  Filled 2019-09-12: qty 2

## 2019-09-12 NOTE — Telephone Encounter (Signed)
Patient has an appt for 3:40 on tomrrow but she wants to know if Percell Michelli would mind sending  In another round of flexirls in for her... I made the appt but she insisted on message being sent. Please advise

## 2019-09-13 ENCOUNTER — Other Ambulatory Visit: Payer: Self-pay

## 2019-09-13 ENCOUNTER — Ambulatory Visit (INDEPENDENT_AMBULATORY_CARE_PROVIDER_SITE_OTHER): Payer: Medicare Other | Admitting: Medical

## 2019-09-13 VITALS — BP 124/70 | HR 92 | Temp 98.0°F | Resp 14 | Ht 58.5 in | Wt 213.4 lb

## 2019-09-13 DIAGNOSIS — J029 Acute pharyngitis, unspecified: Secondary | ICD-10-CM

## 2019-09-13 DIAGNOSIS — M542 Cervicalgia: Secondary | ICD-10-CM

## 2019-09-13 DIAGNOSIS — S46811A Strain of other muscles, fascia and tendons at shoulder and upper arm level, right arm, initial encounter: Secondary | ICD-10-CM

## 2019-09-13 LAB — POCT RAPID STREP A (OFFICE): Rapid Strep A Screen: NEGATIVE

## 2019-09-13 MED ORDER — IBUPROFEN 600 MG PO TABS: 600.0000 mg | ORAL_TABLET | Freq: Three times a day (TID) | ORAL | 0 refills | Status: DC | PRN

## 2019-09-13 MED ORDER — CYCLOBENZAPRINE HCL 5 MG PO TABS: 5.0000 mg | ORAL_TABLET | Freq: Every day | ORAL | 0 refills | Status: DC

## 2019-09-13 MED ORDER — KETOROLAC TROMETHAMINE 60 MG/2ML IM SOLN
60.0000 mg | Freq: Once | INTRAMUSCULAR | Status: AC
  Administered 2019-09-13: 60 mg via INTRAMUSCULAR

## 2019-09-13 NOTE — Patient Instructions (Addendum)
For your recurrent rt side trapezius pain will give you toradol 60 mg im, low dose flexeril 5 mg q hs. Start ibuprofen tomorrow. If pain does not resolve then will refer to sports medicine.  For recent st we did rapid strep test and send out strep test. If your symptoms worsen pending study let me know but want to follow results before prescribing antibiotic. Also if your symptoms worsen or change recommend repeat covid but recent negative test on past Thursday.

## 2019-09-13 NOTE — Progress Notes (Signed)
Subjective:    Patient ID: Amber Huffman, female    DOB: 1950/06/20, 69 y.o.   MRN: [id number]  HPI  Pt in with rt shoulder pain. Pt  pain more in rt trapezius area going toward her rt bicep area. On discussion no direct pain in shoulder but points to rt trapezius.  Pt had recent covid test on Thursday prior to cholecystectomy. She was supposed to quarantine for 3 days but she had to go to grocery store so surgery was cancelled.  Pt cervical spine xray showed below early 08-16-2019. IMPRESSION: 1. No acute/traumatic cervical spine pathology. 2. Multilevel degenerative changes.   Pt also has mild st. Hurts mildly. She thinks strep. No fever, no chills, no sweats, no bodyaches or loss of smell/taste.  Early august pt had very similar presentation of pain. Very similar pain as today. She had very good response to toradol im and flexeril.     Review of Systems  Constitutional: Negative for chills, fatigue and fever.  Respiratory: Negative for cough, chest tightness, shortness of breath and wheezing.   Cardiovascular: Negative for chest pain and palpitations.  Gastrointestinal: Negative for abdominal pain.  Musculoskeletal:       See hpi.  Skin: Negative for rash.  Neurological: Negative for dizziness, syncope, weakness, numbness and headaches.  Hematological: Negative for adenopathy. Does not bruise/bleed easily.  Psychiatric/Behavioral: Negative for behavioral problems, decreased concentration and dysphoric mood.    Past Medical History:  Diagnosis Date   Anemia    Arthritis    Thumbs   Hyperlipidemia    Hypertension    Morbid obesity with BMI of 50.0-59.9, adult ([place])    Pneumonia      Social History   Socioeconomic History   Marital status: Married    Spouse name: Not on file   Number of children: Not on file   Years of education: Not on file   Highest education level: Not on file  Occupational History   Not on file  Tobacco Use   Smoking status:  Passive Smoke Exposure - Never Smoker   Smokeless tobacco: Never Used  Vaping Use   Vaping Use: Never used  Substance and Sexual Activity   Alcohol use: Not Currently    Comment: 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   Drug use: Never   Sexual activity: Not on file    Comment: Hysterectomy  Other Topics Concern   Not on file  Social History Narrative   Not on file   Social Determinants of Health   Financial Resource Strain:    Difficulty of Paying Living Expenses: Not on file  Food Insecurity:    Worried About [place] in the Last Year: Not on file   Ran Out of Food in the Last Year: Not on file  Transportation Needs:    Lack of Transportation (Medical): Not on file   Lack of Transportation (Non-Medical): Not on file  Physical Activity:    Days of Exercise per Week: Not on file   Minutes of Exercise per Session: Not on file  Stress:    Feeling of Stress : Not on file  Social Connections:    Frequency of Communication with Friends and Family: Not on file   Frequency of Social Gatherings with Friends and Family: Not on file   Attends Religious Services: Not on file   Active Member of Clubs or Organizations: Not  on file   Attends Club or Organization Meetings: Not on file   Marital Status: Not on file  Intimate Partner Violence:    Fear of Current or Ex-Partner: Not on file   Emotionally Abused: Not on file   Physically Abused: Not on file   Sexually Abused: Not on file    Past Surgical History:  Procedure Laterality Date   ABDOMINAL HYSTERECTOMY     COLONOSCOPY     OOPHORECTOMY     WISDOM TOOTH EXTRACTION      Family History  Problem Relation Age of Onset   Heart disease Father        After the age of 58    Allergies  Allergen Reactions   Penicillins Rash    Has patient had a PCN reaction causing immediate rash, facial/tongue/throat  swelling, SOB or lightheadedness with hypotension: Yes Has patient had a PCN reaction causing severe rash involving mucus membranes or skin necrosis: No Has patient had a PCN reaction that required hospitalization: No Has patient had a PCN reaction occurring within the last 10 years: No If all of the above answers are "NO", then may proceed with Cephalosporin use.    Current Outpatient Medications on File Prior to Visit  Medication Sig Dispense Refill   acetaminophen (TYLENOL) 500 MG tablet Take 1,000 mg by mouth every 6 (six) hours as needed (shoulder pain.).     hydrochlorothiazide (HYDRODIURIL) 25 MG tablet Take 1 tablet (25 mg total) by mouth daily. (Patient taking differently: Take 25 mg by mouth every evening. ) 90 tablet 3   Vitamin D, Ergocalciferol, (DRISDOL) 1.25 MG (50000 UT) CAPS capsule Take 1 capsule (50,000 Units total) by mouth every Thursday. (Patient taking differently: Take 50,000 Units by mouth every 14 (fourteen) days. Wednesdays) 5 capsule 5   No current facility-administered medications on file prior to visit.    BP 124/70 (BP Location: Right Arm, Patient Position: Sitting, Cuff Size: Small)    Pulse 92    Temp 98 F (36.7 C) (Oral)    Resp 14    Ht 4\' 6"  (1.372 m)    Wt 213 lb 6.4 oz (96.8 kg)    SpO2 95%    BMI 51.45 kg/m       Objective:   Physical Exam  Physical Exam  General- No acute distress. Pleasant patient. Neck- Full range of motion, no jvd rt sided trapezius pain diffuse but more prominent toward rt shoulder. Has very point tender area. Lungs- Clear, even and unlabored. Heart- regular rate and rhythm. Neurologic- CNII- XII grossly intact.  Rt upper ext- no pain on palpation of shoulder directly or in her arm heent- mild reddish pink. Mild hypertrophy.    Assessment & Plan:  For your recurrent rt side trapezius pain will give you toradol 60 mg im, low dose flexeril 5 mg q hs. Start ibuprofen tomorrow. If pain does not resolve then will refer  to sports medicine.  For recent st we did rapid strep test and send out strep test. If your symptoms worsen pending study let me know but want to follow results before prescribing antibiotic. Also if your symptoms worsen or change recommend repeat covid but recent negative test on past Thursday.  Pt is going to to use disucss use of flexeril and ibuprofen with pharmacist..  Mackie Pai, PA-C   Time spent with patient today was 30  minutes which consisted of chart review, discussing diagnosis, work up treatment and documentation.

## 2019-09-14 ENCOUNTER — Ambulatory Visit: Payer: Medicare Other | Admitting: Nurse Practitioner

## 2019-09-14 ENCOUNTER — Encounter: Payer: Self-pay | Admitting: Medical

## 2019-09-14 ENCOUNTER — Telehealth: Payer: Self-pay

## 2019-09-14 NOTE — Telephone Encounter (Signed)
Patient called stating AVS was wrong from yesterday regarding her height. Paper states 4'6" however patient is 4' 10.5" She would like this to be fixed in her chart to reflect on her BMI as well.   I have corrected this for patient.

## 2019-09-15 ENCOUNTER — Telehealth: Payer: Self-pay | Admitting: Medical

## 2019-09-15 DIAGNOSIS — S46811A Strain of other muscles, fascia and tendons at shoulder and upper arm level, right arm, initial encounter: Secondary | ICD-10-CM

## 2019-09-15 NOTE — Telephone Encounter (Signed)
Patient wants to know if she can take Flexiril in the morning and evening. & if he would consider ordering oral toradal ( the shot she got) in a pill form so can can have that at that on hand as well  Please advise

## 2019-09-15 NOTE — Telephone Encounter (Signed)
I don't want to precribe flexeril except for night time use. As med is sedating and can cause dizziness. Medication not recommended in her age group. I prescribe since benefit at night exceeds risk. Day time use I think too dangerous. I don't prescribe toradol. I think it works better IM/injection. Also I don't write daily use as repeat use cause concern for efffect on stomach and effect on kidney function. I had plans to refer to sports medicine if her symptoms/signs persist. So I went ahead and put the referal in.

## 2019-09-15 NOTE — Telephone Encounter (Signed)
Referral to sports med placed.

## 2019-09-16 LAB — CULTURE, GROUP A STREP
MICRO NUMBER:: 10893779
SPECIMEN QUALITY:: ADEQUATE

## 2019-09-21 NOTE — Telephone Encounter (Signed)
Patient did call back asking for Sports med Referral. I told her it was already placed and gave her the number to call and get scheduled

## 2019-09-27 ENCOUNTER — Other Ambulatory Visit: Payer: Self-pay

## 2019-09-27 ENCOUNTER — Ambulatory Visit (INDEPENDENT_AMBULATORY_CARE_PROVIDER_SITE_OTHER): Payer: Medicare Other | Admitting: Family Medicine

## 2019-09-27 ENCOUNTER — Encounter: Payer: Self-pay | Admitting: Family Medicine

## 2019-09-27 VITALS — BP 168/95 | Ht 59.0 in | Wt 213.0 lb

## 2019-09-27 DIAGNOSIS — M5412 Radiculopathy, cervical region: Secondary | ICD-10-CM

## 2019-09-27 NOTE — Progress Notes (Signed)
Amber Huffman - 69 y.o. female MRN [id number]  Date of birth: 1950-11-21  SUBJECTIVE:  Including CC & ROS.  Chief Complaint  Patient presents with  . Shoulder Pain    right x 3 weeks    Amber Huffman is a 69 y.o. female that is presenting with right neck and shoulder and arm pain.  She feels improvement with home modalities and use of Voltaren gel.  Denies any specific inciting event.  No history of similar pain.  No history of surgery.  She did have some pain that extended down to the forearm.   Review of Systems See HPI   HISTORY: Past Medical, Surgical, Social, and Family History Reviewed & Updated per EMR.   Pertinent Historical Findings include:  Past Medical History:  Diagnosis Date  . Anemia   . Arthritis    Thumbs  . Hyperlipidemia   . Hypertension   . Morbid obesity with BMI of 50.0-59.9, adult ([place])   . Pneumonia     Past Surgical History:  Procedure Laterality Date  . ABDOMINAL HYSTERECTOMY    . COLONOSCOPY    . OOPHORECTOMY    . WISDOM TOOTH EXTRACTION      Family History  Problem Relation Age of Onset  . Heart disease Father        After the age of 4    Social History   Socioeconomic History  . Marital status: Married    Spouse name: Not on file  . Number of children: Not on file  . Years of education: Not on file  . Highest education level: Not on file  Occupational History  . Not on file  Tobacco Use  . Smoking status: Passive Smoke Exposure - Never Smoker  . Smokeless tobacco: Never Used  Vaping Use  . Vaping Use: Never used  Substance and Sexual Activity  . 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  . Drug use: Never  . Sexual activity: Not on file    Comment: Hysterectomy  Other Topics Concern  . Not on file  Social History Narrative  . Not on file   Social Determinants of Health   Financial Resource Strain:     . Difficulty of Paying Living Expenses: Not on file  Food Insecurity:   . Worried About Charity fundraiser in the Last Year: Not on file  . Ran Out of Food in the Last Year: Not on file  Transportation Needs:   . Lack of Transportation (Medical): Not on file  . Lack of Transportation (Non-Medical): Not on file  Physical Activity:   . Days of Exercise per Week: Not on file  . Minutes of Exercise per Session: Not on file  Stress:   . Feeling of Stress : Not on file  Social Connections:   . Frequency of Communication with Friends and Family: Not on file  . Frequency of Social Gatherings with Friends and Family: Not on file  . Attends Religious Services: Not on file  . Active Member of Clubs or Organizations: Not on file  . Attends Archivist Meetings: Not on file  . Marital Status: Not on file  Intimate Partner Violence:   . Fear of Current or Ex-Partner: Not on file  . Emotionally Abused: Not on file  . Physically Abused: Not on file  . Sexually Abused: Not on file  PHYSICAL EXAM:  VS: BP (!) 168/95   Ht 4\' 11"  (1.499 m)   Wt 213 lb (96.6 kg)   BMI 43.02 kg/m  Physical Exam Gen: NAD, alert, cooperative with exam, well-appearing MSK:  Neck: Normal flexion and extension. Limited lateral rotation to the left. Normal lateral rotation to the right. Normal shoulder range of motion of the right. Neurovascularly intact     ASSESSMENT & PLAN:   Cervical radiculopathy Symptoms are likely radicular in nature.  She did have trigger points in the trapezius.  -Counseled on home exercise therapy and supportive care. -Could consider imaging or physical therapy.

## 2019-09-27 NOTE — Patient Instructions (Signed)
Nice to meet you Please try heat  Please try the exercises  Please continue the voltaren   Please send me a message in MyChart with any questions or updates.  Please see [place] back as needed.   --Dr. Raeford Razor

## 2019-09-27 NOTE — Assessment & Plan Note (Signed)
Symptoms are likely radicular in nature.  She did have trigger points in the trapezius.  -Counseled on home exercise therapy and supportive care. -Could consider imaging or physical therapy.

## 2019-10-05 ENCOUNTER — Encounter: Payer: Self-pay | Admitting: Family Medicine

## 2019-10-05 ENCOUNTER — Ambulatory Visit: Payer: Self-pay

## 2019-10-05 ENCOUNTER — Other Ambulatory Visit: Payer: Self-pay

## 2019-10-05 ENCOUNTER — Encounter: Payer: Self-pay | Admitting: Medical

## 2019-10-05 ENCOUNTER — Ambulatory Visit (INDEPENDENT_AMBULATORY_CARE_PROVIDER_SITE_OTHER): Payer: Medicare Other | Admitting: Medical

## 2019-10-05 ENCOUNTER — Ambulatory Visit (INDEPENDENT_AMBULATORY_CARE_PROVIDER_SITE_OTHER): Payer: Medicare Other | Admitting: Family Medicine

## 2019-10-05 VITALS — BP 128/87 | HR 98 | Ht 59.0 in | Wt 218.0 lb

## 2019-10-05 VITALS — BP 130/80 | HR 95 | Temp 98.4°F | Resp 18 | Wt 218.6 lb

## 2019-10-05 DIAGNOSIS — E785 Hyperlipidemia, unspecified: Secondary | ICD-10-CM

## 2019-10-05 DIAGNOSIS — R42 Dizziness and giddiness: Secondary | ICD-10-CM

## 2019-10-05 DIAGNOSIS — E1169 Type 2 diabetes mellitus with other specified complication: Secondary | ICD-10-CM | POA: Diagnosis not present

## 2019-10-05 DIAGNOSIS — M7541 Impingement syndrome of right shoulder: Secondary | ICD-10-CM

## 2019-10-05 DIAGNOSIS — I6529 Occlusion and stenosis of unspecified carotid artery: Secondary | ICD-10-CM

## 2019-10-05 DIAGNOSIS — E119 Type 2 diabetes mellitus without complications: Secondary | ICD-10-CM

## 2019-10-05 MED ORDER — EZETIMIBE 10 MG PO TABS: 10.0000 mg | ORAL_TABLET | Freq: Every day | ORAL | 0 refills | Status: DC

## 2019-10-05 MED ORDER — METFORMIN HCL 500 MG PO TABS: 500.0000 mg | ORAL_TABLET | Freq: Two times a day (BID) | ORAL | 3 refills | Status: DC

## 2019-10-05 MED ORDER — METHYLPREDNISOLONE ACETATE 40 MG/ML IJ SUSP
40.0000 mg | Freq: Once | INTRAMUSCULAR | Status: AC
  Administered 2019-10-05: 40 mg via INTRA_ARTICULAR

## 2019-10-05 MED ORDER — MECLIZINE HCL 12.5 MG PO TABS: 12.5000 mg | ORAL_TABLET | Freq: Three times a day (TID) | ORAL | 0 refills | Status: DC | PRN

## 2019-10-05 NOTE — Assessment & Plan Note (Signed)
Pain is acutely worsened.  Possible for impingement as to the source of her pain.  Does have a radicular component as well. -Counseled on home exercise therapy and supportive care. -Injection. -Could consider gabapentin or physical therapy.

## 2019-10-05 NOTE — Progress Notes (Signed)
Amber Huffman - 69 y.o. female MRN [id number]  Date of birth: [birth year]  SUBJECTIVE:  Including CC & ROS.  Chief Complaint  Patient presents with  . Shoulder Pain    right    Amber Huffman is a 69 y.o. female that is presenting with worsening of her right shoulder and neck pain.  The pain has become more constant and severe in nature.  She does have occurred on the forearm.  Seems to be constant and nonresponsive to therapies thus far.   Review of Systems See HPI   HISTORY: Past Medical, Surgical, Social, and Family History Reviewed & Updated per EMR.   Pertinent Historical Findings include:  Past Medical History:  Diagnosis Date  . Anemia   . Arthritis    Thumbs  . Hyperlipidemia   . Hypertension   . Morbid obesity with BMI of 50.0-59.9, adult ([place])   . Pneumonia     Past Surgical History:  Procedure Laterality Date  . ABDOMINAL HYSTERECTOMY    . COLONOSCOPY    . OOPHORECTOMY    . WISDOM TOOTH EXTRACTION      Family History  Problem Relation Age of Onset  . Heart disease Father        After the age of 58    Social History   Socioeconomic History  . Marital status: Married    Spouse name: Not on file  . Number of children: Not on file  . Years of education: Not on file  . Highest education level: Not on file  Occupational History  . Not on file  Tobacco Use  . Smoking status: Passive Smoke Exposure - Never Smoker  . Smokeless tobacco: Never Used  Vaping Use  . Vaping Use: Never used  Substance and Sexual Activity  . 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  . Drug use: Never  . Sexual activity: Not on file    Comment: Hysterectomy  Other Topics Concern  . Not on file  Social History Narrative  . Not on file   Social Determinants of Health   Financial Resource Strain:   . Difficulty of Paying Living Expenses: Not on file    Food Insecurity:   . Worried About Charity fundraiser in the Last Year: Not on file  . Ran Out of Food in the Last Year: Not on file  Transportation Needs:   . Lack of Transportation (Medical): Not on file  . Lack of Transportation (Non-Medical): Not on file  Physical Activity:   . Days of Exercise per Week: Not on file  . Minutes of Exercise per Session: Not on file  Stress:   . Feeling of Stress : Not on file  Social Connections:   . Frequency of Communication with Friends and Family: Not on file  . Frequency of Social Gatherings with Friends and Family: Not on file  . Attends Religious Services: Not on file  . Active Member of Clubs or Organizations: Not on file  . Attends Archivist Meetings: Not on file  . Marital Status: Not on file  Intimate Partner Violence:   . Fear of Current or Ex-Partner: Not on file  . Emotionally Abused: Not on file  . Physically Abused: Not on file  . Sexually Abused: Not on file     PHYSICAL EXAM:  VS: BP 128/87   Pulse 98  Ht 4\' 11"  (1.499 m)   Wt 218 lb (98.9 kg)   BMI 44.03 kg/m  Physical Exam Gen: NAD, alert, cooperative with exam, well-appearing MSK:  Right shoulder: Normal external rotation internal rotation. Normal strength resistance. Some pain with empty can testing. Neurovascularly intact   Aspiration/Injection Procedure Note Amber Huffman 1950-08-18  Procedure: Injection Indications: Right shoulder pain  Procedure Details Consent: Risks of procedure as well as the alternatives and risks of each were explained to the (patient/caregiver).  Consent for procedure obtained. Time Out: Verified patient identification, verified procedure, site/side was marked, verified correct patient position, special equipment/implants available, medications/allergies/relevent history reviewed, required imaging and test results available.  Performed.  The area was cleaned with iodine and alcohol swabs.    The right subacromial  space was injected using 1 cc's of 40 mg Depo-Medrol and 4 cc's of 0.25% bupivacaine with a 22 1 1/2" needle.  Ultrasound was used. Images were obtained in long views showing the injection.     A sterile dressing was applied.  Patient did tolerate procedure well.     ASSESSMENT & PLAN:   Impingement syndrome of right shoulder Pain is acutely worsened.  Possible for impingement as to the source of her pain.  Does have a radicular component as well. -Counseled on home exercise therapy and supportive care. -Injection. -Could consider gabapentin or physical therapy.

## 2019-10-05 NOTE — Patient Instructions (Signed)
You have had recent episodes of dizziness twice in the past 3 to 4 weeks.  Episodes resolved within about 1 hour.  Minimal transient blurred vision over this weekend with the event.  However no other motor or sensory type changes reported.  Currently her neurologic exam is normal.  No symptoms presently.  Will get CBC, CMP and decided to go ahead and order carotid ultrasound as you do report history of carotid stenosis.  If you have recurrent dizziness lasting for more than 5 minutes then can try meclizine.  Keep yourself well-hydrated.  If you have dizziness with gross motor or sensory function deficits then have to recommend ED evaluation.  Presently based on exam and history I do not think CT head imaging indicated.  You do have history of diabetes.  Historically your A1c has been well controlled.  I do want you to get a glucometer through your insurance or by 1 directly from the pharmacy.  Your insurance might send you for glucometer or they might give you the name of their preferred glucometer.  If they give you the name of the preferred glucometer then we could send a prescription to your pharmacy.  This Friday in the roadblocks trying to get glucometer then could at least get [employer] brand glucometer from [facility].  I want you to check your sugars twice daily and if you become symptomatic.  If you get sugars above 200 or less then 70 definitely let us know.  Will prescribe low-dose Metformin 500 mg to take daily.  You expressed some reluctance to use medicine so you could even take 250 mg daily.  Advise low sugar diet.  For history of hyperlipidemia, would offer you prescription of Zetia.  I am giving that as a print prescription today.  Blood pressure is 130/80 present.  I want you to start checking your blood pressure daily and if your blood pressure is over 140/90 then would recommend low-dose losartan or lisinopril.  Follow-up in 3 weeks or as needed.

## 2019-10-05 NOTE — Progress Notes (Signed)
Subjective:    Patient ID: Amber Huffman, female    DOB: 12/06/1950, 69 y.o.   MRN: [id number]  HPI  Pt in for recent dizziness. Pt states on Saturday she was eating a cheese burger and felt off balance. But she could walk, no ha, no nausea, no vomiting, no gross motor or sensory function deficits.   Pt sister think she is diabetic. She did have slight blurred vision. This lasted for about one hour. Did not describe vertigo.  2-3 weeks ago had similar event. Was off balance and dizzy but she can't remember details.   Pt a1c is elevated in the past. Notified pt in the past. Med was never started.  Pt in past has high cholesterol. Pt tried statin in the past and had severe fatigue.   Pt states she remember some possible carotid stenosis in the past.    Review of Systems  Constitutional: Negative for chills, fatigue and fever.  Eyes:       No symptoms presenlty.  Respiratory: Negative for cough, chest tightness, shortness of breath and wheezing.   Cardiovascular: Negative for palpitations.  Gastrointestinal: Negative for abdominal pain, constipation, diarrhea, nausea and vomiting.  Endocrine: Negative for polydipsia, polyphagia and polyuria.  Genitourinary: Negative for dysuria.  Musculoskeletal: Negative for back pain and myalgias.  Skin: Negative for rash and wound.  Neurological: Negative for dizziness, seizures, syncope, facial asymmetry, speech difficulty, weakness and light-headedness.       None now. But see hpi.  Psychiatric/Behavioral: Negative for behavioral problems, decreased concentration and hallucinations. The patient is not nervous/anxious and is not hyperactive.     Past Medical History:  Diagnosis Date  . Anemia   . Arthritis    Thumbs  . Hyperlipidemia   . Hypertension   . Morbid obesity with BMI of 50.0-59.9, adult ([place])   . Pneumonia      Social History   Socioeconomic History  . Marital status: Married    Spouse name: Not on file  . Number of  children: Not on file  . Years of education: Not on file  . Highest education level: Not on file  Occupational History  . Not on file  Tobacco Use  . Smoking status: Passive Smoke Exposure - Never Smoker  . Smokeless tobacco: Never Used  Vaping Use  . Vaping Use: Never used  Substance and Sexual Activity  . 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  . Drug use: Never  . Sexual activity: Not on file    Comment: Hysterectomy  Other Topics Concern  . Not on file  Social History Narrative  . Not on file   Social Determinants of Health   Financial Resource Strain:   . Difficulty of Paying Living Expenses: Not on file  Food Insecurity:   . Worried About Charity fundraiser in the Last Year: Not on file  . Ran Out of Food in the Last Year: Not on file  Transportation Needs:   . Lack of Transportation (Medical): Not on file  . Lack of Transportation (Non-Medical): Not on file  Physical Activity:   . Days of Exercise per Week: Not on file  . Minutes of Exercise per Session: Not on file  Stress:   . Feeling of Stress : Not on file  Social Connections:   . Frequency of Communication with Friends and Family:  Not on file  . Frequency of Social Gatherings with Friends and Family: Not on file  . Attends Religious Services: Not on file  . Active Member of Clubs or Organizations: Not on file  . Attends Archivist Meetings: Not on file  . Marital Status: Not on file  Intimate Partner Violence:   . Fear of Current or Ex-Partner: Not on file  . Emotionally Abused: Not on file  . Physically Abused: Not on file  . Sexually Abused: Not on file    Past Surgical History:  Procedure Laterality Date  . ABDOMINAL HYSTERECTOMY    . COLONOSCOPY    . OOPHORECTOMY    . WISDOM TOOTH EXTRACTION      Family History  Problem Relation Age of Onset  . Heart disease  Father        After the age of 13    Allergies  Allergen Reactions  . Penicillins Rash    Has patient had a PCN reaction causing immediate rash, facial/tongue/throat swelling, SOB or lightheadedness with hypotension: Yes Has patient had a PCN reaction causing severe rash involving mucus membranes or skin necrosis: No Has patient had a PCN reaction that required hospitalization: No Has patient had a PCN reaction occurring within the last 10 years: No If all of the above answers are "NO", then may proceed with Cephalosporin use.    Current Outpatient Medications on File Prior to Visit  Medication Sig Dispense Refill  . acetaminophen (TYLENOL) 500 MG tablet Take 1,000 mg by mouth every 6 (six) hours as needed (shoulder pain.).    Marland Kitchen cyclobenzaprine (FLEXERIL) 5 MG tablet Take 1 tablet (5 mg total) by mouth at bedtime. 7 tablet 0  . hydrochlorothiazide (HYDRODIURIL) 25 MG tablet Take 1 tablet (25 mg total) by mouth daily. (Patient taking differently: Take 25 mg by mouth every evening. ) 90 tablet 3  . ibuprofen (ADVIL) 600 MG tablet Take 1 tablet (600 mg total) by mouth every 8 (eight) hours as needed. 30 tablet 0  . Vitamin D, Ergocalciferol, (DRISDOL) 1.25 MG (50000 UT) CAPS capsule Take 1 capsule (50,000 Units total) by mouth every Thursday. (Patient taking differently: Take 50,000 Units by mouth every 14 (fourteen) days. Wednesdays) 5 capsule 5   No current facility-administered medications on file prior to visit.    BP 130/80   Pulse 95   Temp 98.4 F (36.9 C) (Oral)   Resp 18   Wt 218 lb 9.6 oz (99.2 kg)   SpO2 99%   BMI 44.15 kg/m      Objective:   Physical Exam  General Mental Status- Alert. General Appearance- Not in acute distress.   Skin General: Color- Normal Color. Moisture- Normal Moisture.  Neck Carotid Arteries- Normal color. Moisture- Normal Moisture. No carotid bruits. No JVD.  Chest and Lung Exam Auscultation: Breath  Sounds:-Normal.  Cardiovascular Auscultation:Rythm- Regular. Murmurs & Other Heart Sounds:Auscultation of the heart reveals- No Murmurs.  Abdomen Inspection:-Inspeection Normal. Palpation/Percussion:Note:No mass. Palpation and Percussion of the abdomen reveal- Non Tender, Non Distended + BS, no rebound or guarding.    Neurologic Cranial Nerve exam:- CN III-XII intact(No nystagmus), symmetric smile. Drift Test:- No drift. Romberg Exam:- Negative.  l.Finger to Nose:- Normal/Intact Strength:- 5/5 equal and symmetric strength both upper and lower extremities.      Assessment & Plan:  You have had recent episodes of dizziness twice in the past 3 to 4 weeks.  Episodes resolved within about 1 hour.  Minimal transient blurred vision over this  weekend with the event.  However no other motor or sensory type changes reported.  Currently her neurologic exam is normal.  No symptoms presently.  Will get CBC, CMP and decided to go ahead and order carotid ultrasound as you do report history of carotid stenosis.  If you have recurrent dizziness lasting for more than 5 minutes then can try meclizine.  Keep yourself well-hydrated.  If you have dizziness with gross motor or sensory function deficits then have to recommend ED evaluation.  Presently based on exam and history I do not think CT head imaging indicated.  You do have history of diabetes.  Historically your A1c has been well controlled.  I do want you to get a glucometer through your insurance or by 1 directly from the pharmacy.  Your insurance might send you for glucometer or they might give you the name of their preferred glucometer.  If they give you the name of the preferred glucometer then we could send a prescription to your pharmacy.  This Friday in the roadblocks trying to get glucometer then could at least get [employer] brand glucometer from [facility].  I want you to check your sugars twice daily and if you become symptomatic.  If you get sugars  above 200 or less then 70 definitely let us know.  Will prescribe low-dose Metformin 500 mg to take daily.  You expressed some reluctance to use medicine so you could even take 250 mg daily.  Advise low sugar diet.  For history of hyperlipidemia, would offer you prescription of Zetia.  I am giving that as a print prescription today.  Blood pressure is 130/80 present.  I want you to start checking your blood pressure daily and if your blood pressure is over 140/90 then would recommend low-dose losartan or lisinopril.  Follow-up in 3 weeks or as needed.  Mackie Pai, PA-C   Time spent with patient today was  40 minutes which consisted of chart review, discussing diagnoses, work up, treatment, plan going forward and documentation.

## 2019-10-05 NOTE — Patient Instructions (Signed)
Good to see you Please alternate heat and ice  Please try the exercises if the pain has improved  Please let me know if your pain doesn't improve   Please send me a message in MyChart with any questions or updates.  Please see me back in 3 weeks.   --Dr. Raeford Razor

## 2019-10-06 LAB — COMPREHENSIVE METABOLIC PANEL
AG Ratio: 1.5 (calc) (ref 1.0–2.5)
ALT: 19 U/L (ref 6–29)
AST: 21 U/L (ref 10–35)
Albumin: 4.1 g/dL (ref 3.6–5.1)
Alkaline phosphatase (APISO): 71 U/L (ref 37–153)
BUN/Creatinine Ratio: 33 (calc) — ABNORMAL HIGH (ref 6–22)
BUN: 19 mg/dL (ref 7–25)
CO2: 37 mmol/L — ABNORMAL HIGH (ref 20–32)
Calcium: 9.5 mg/dL (ref 8.6–10.4)
Chloride: 98 mmol/L (ref 98–110)
Creat: 0.58 mg/dL — ABNORMAL LOW (ref 0.60–0.93)
Globulin: 2.7 g/dL (calc) (ref 1.9–3.7)
Glucose, Bld: 110 mg/dL — ABNORMAL HIGH (ref 65–99)
Potassium: 4 mmol/L (ref 3.5–5.3)
Sodium: 140 mmol/L (ref 135–146)
Total Bilirubin: 0.4 mg/dL (ref 0.2–1.2)
Total Protein: 6.8 g/dL (ref 6.1–8.1)

## 2019-10-06 LAB — CBC WITH DIFFERENTIAL/PLATELET
Absolute Monocytes: 628 cells/uL (ref 200–950)
Basophils Absolute: 36 cells/uL (ref 0–200)
Basophils Relative: 0.4 %
Eosinophils Absolute: 410 cells/uL (ref 15–500)
Eosinophils Relative: 4.5 %
HCT: 47.5 % — ABNORMAL HIGH (ref 35.0–45.0)
Hemoglobin: 15.1 g/dL (ref 11.7–15.5)
Lymphs Abs: 2384 cells/uL (ref 850–3900)
MCH: 26.9 pg — ABNORMAL LOW (ref 27.0–33.0)
MCHC: 31.8 g/dL — ABNORMAL LOW (ref 32.0–36.0)
MCV: 84.7 fL (ref 80.0–100.0)
MPV: 10.6 fL (ref 7.5–12.5)
Monocytes Relative: 6.9 %
Neutro Abs: 5642 cells/uL (ref 1500–7800)
Neutrophils Relative %: 62 %
Platelets: 358 10*3/uL (ref 140–400)
RBC: 5.61 10*6/uL — ABNORMAL HIGH (ref 3.80–5.10)
RDW: 13.8 % (ref 11.0–15.0)
Total Lymphocyte: 26.2 %
WBC: 9.1 10*3/uL (ref 3.8–10.8)

## 2019-10-07 ENCOUNTER — Telehealth: Payer: Self-pay | Admitting: Medical

## 2019-10-07 NOTE — Telephone Encounter (Signed)
Caller Niomie Englert  Call Back # [phone]  Patient states insurance will cover ONE touch diabetic meter. Patient would like prescription sent to [facility].

## 2019-10-07 NOTE — Telephone Encounter (Signed)
Patient wants to know if she can get a medication for her back , her back hurts when she coughs and walks .

## 2019-10-07 NOTE — Telephone Encounter (Signed)
Caller Cheria Sadiq  Call Back # [phone]  Patient states back pain and stiff , patient states injured occurred on toilet. Patient states would like a call back

## 2019-10-08 ENCOUNTER — Telehealth: Payer: Self-pay | Admitting: Medical

## 2019-10-08 MED ORDER — GABAPENTIN 100 MG PO CAPS: 100.0000 mg | ORAL_CAPSULE | Freq: Every day | ORAL | 0 refills | Status: DC

## 2019-10-08 NOTE — Telephone Encounter (Signed)
  Patient states insurance will cover ONE touch diabetic meter. Patient would like prescription sent to [facility]. Will you send that presciption in along with lancets and strips. Check sugar twice daily.   Also about her back. We did not discuss this on her last visit. She is seeing sports medcine for trapezius/shoulder are pain.  Dr. Raeford Razor note states he is considering gabapentin.   So I can send in gabapentin to use one tablet at night. See if this helps. Sedation is side effect of gabapentin. If helps with pain and not to sedating can use during day later. But currently recommend just at night. Will send rx to pharmacy.

## 2019-10-10 ENCOUNTER — Ambulatory Visit: Payer: Medicare Other | Admitting: Family Medicine

## 2019-10-10 MED ORDER — ONETOUCH VERIO VI STRP: ORAL_STRIP | 2 refills | Status: DC

## 2019-10-10 MED ORDER — ONETOUCH DELICA PLUS LANCET33G MISC: 2 refills | Status: DC

## 2019-10-10 MED ORDER — ONETOUCH VERIO FLEX SYSTEM W/DEVICE KIT: PACK | 0 refills | Status: DC

## 2019-10-10 NOTE — Telephone Encounter (Signed)
Script sent  

## 2019-10-10 NOTE — Telephone Encounter (Signed)
Script sent , patient sees Dr.Lowne today 9/27

## 2019-10-10 NOTE — Addendum Note (Signed)
Addended by: Jeronimo Greaves on: 10/10/2019 08:22 AM   Modules accepted: Orders

## 2019-10-13 ENCOUNTER — Ambulatory Visit (HOSPITAL_BASED_OUTPATIENT_CLINIC_OR_DEPARTMENT_OTHER)
Admission: RE | Admit: 2019-10-13 | Discharge: 2019-10-13 | Disposition: A | Discharge status: Home / Self Care | Payer: Medicare Other | Source: Ambulatory Visit | Attending: Medical | Admitting: Medical

## 2019-10-13 ENCOUNTER — Encounter: Payer: Self-pay | Admitting: Family Medicine

## 2019-10-13 ENCOUNTER — Telehealth: Payer: Self-pay | Admitting: Family Medicine

## 2019-10-13 ENCOUNTER — Ambulatory Visit (INDEPENDENT_AMBULATORY_CARE_PROVIDER_SITE_OTHER): Payer: Medicare Other | Admitting: Family Medicine

## 2019-10-13 ENCOUNTER — Other Ambulatory Visit: Payer: Self-pay

## 2019-10-13 VITALS — BP 127/89 | HR 94 | Ht 59.0 in | Wt 216.0 lb

## 2019-10-13 DIAGNOSIS — E1169 Type 2 diabetes mellitus with other specified complication: Secondary | ICD-10-CM

## 2019-10-13 DIAGNOSIS — E119 Type 2 diabetes mellitus without complications: Secondary | ICD-10-CM | POA: Diagnosis not present

## 2019-10-13 DIAGNOSIS — M545 Low back pain, unspecified: Secondary | ICD-10-CM | POA: Insufficient documentation

## 2019-10-13 DIAGNOSIS — E785 Hyperlipidemia, unspecified: Secondary | ICD-10-CM | POA: Insufficient documentation

## 2019-10-13 DIAGNOSIS — I6529 Occlusion and stenosis of unspecified carotid artery: Secondary | ICD-10-CM

## 2019-10-13 DIAGNOSIS — M1712 Unilateral primary osteoarthritis, left knee: Secondary | ICD-10-CM

## 2019-10-13 DIAGNOSIS — M7541 Impingement syndrome of right shoulder: Secondary | ICD-10-CM

## 2019-10-13 DIAGNOSIS — M5412 Radiculopathy, cervical region: Secondary | ICD-10-CM | POA: Diagnosis not present

## 2019-10-13 NOTE — Telephone Encounter (Signed)
Pt called states can't get into Physical therapy until 10/12 & provider offered her Rx for Baclofen but she declined thinking she would be in PT real soon but they are booked. --Pt says she will need that Rx after all.  --Forwarding request to provider that if approved send Rx order to :     [id number] [address] [place], [place] - [address] Phone:  [phone]  Fax:  [phone]     --glh

## 2019-10-13 NOTE — Assessment & Plan Note (Signed)
Still likely has a component of cervical radiculopathy with symptoms she experiences in her forearm.  She did get some improvement with the subacromial injection. -Counseled on home exercise therapy and supportive care. -Referral to physical therapy. -May need to consider further imaging.

## 2019-10-13 NOTE — Progress Notes (Signed)
Amber Huffman - 69 y.o. female MRN [id number]  Date of birth: September 10, 1950  SUBJECTIVE:  Including CC & ROS.  Chief Complaint  Patient presents with  . Back Pain    low back    Amber Huffman is a 69 y.o. female that is presenting with low back pain.  She denies any radicular symptoms.  She has trouble with transitioning from sitting to standing.  No history of surgery.  She is also following up for her left knee and right shoulder and arm pain.  She get improvement with the steroid injection of the shoulder.  She still feels more pain in the forearm.  The left knee has bothered her from time to time.  Sometimes it will give way on her.  She has a history of an injury and surgery from years ago.   Review of Systems See HPI   HISTORY: Past Medical, Surgical, Social, and Family History Reviewed & Updated per EMR.   Pertinent Historical Findings include:  Past Medical History:  Diagnosis Date  . Anemia   . Arthritis    Thumbs  . Hyperlipidemia   . Hypertension   . Morbid obesity with BMI of 50.0-59.9, adult ([place])   . Pneumonia     Past Surgical History:  Procedure Laterality Date  . ABDOMINAL HYSTERECTOMY    . COLONOSCOPY    . OOPHORECTOMY    . WISDOM TOOTH EXTRACTION      Family History  Problem Relation Age of Onset  . Heart disease Father        After the age of 50    Social History   Socioeconomic History  . Marital status: Married    Spouse name: Not on file  . Number of children: Not on file  . Years of education: Not on file  . Highest education level: Not on file  Occupational History  . Not on file  Tobacco Use  . Smoking status: Passive Smoke Exposure - Never Smoker  . Smokeless tobacco: Never Used  Vaping Use  . Vaping Use: Never used  Substance and Sexual Activity  . 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  . Drug use: Never  .  Sexual activity: Not on file    Comment: Hysterectomy  Other Topics Concern  . Not on file  Social History Narrative  . Not on file   Social Determinants of Health   Financial Resource Strain:   . Difficulty of Paying Living Expenses: Not on file  Food Insecurity:   . Worried About Charity fundraiser in the Last Year: Not on file  . Ran Out of Food in the Last Year: Not on file  Transportation Needs:   . Lack of Transportation (Medical): Not on file  . Lack of Transportation (Non-Medical): Not on file  Physical Activity:   . Days of Exercise per Week: Not on file  . Minutes of Exercise per Session: Not on file  Stress:   . Feeling of Stress : Not on file  Social Connections:   . Frequency of Communication with Friends and Family: Not on file  . Frequency of Social Gatherings with Friends and Family: Not on file  . Attends Religious Services: Not on file  . Active Member of Clubs or Organizations: Not on file  . Attends Archivist Meetings: Not on file  . Marital  Status: Not on file  Intimate Partner Violence:   . Fear of Current or Ex-Partner: Not on file  . Emotionally Abused: Not on file  . Physically Abused: Not on file  . Sexually Abused: Not on file     PHYSICAL EXAM:  VS: BP 127/89   Pulse 94   Ht 4\' 11"  (1.499 m)   Wt 216 lb (98 kg)   BMI 43.63 kg/m  Physical Exam Gen: NAD, alert, cooperative with exam, well-appearing MSK:  Back: Limited flexion extension. Normal strength in hip flexion. Left knee: No obvious effusion. Normal range of motion. Neurovascularly intact     ASSESSMENT & PLAN:   Cervical radiculopathy Still likely has a component of cervical radiculopathy with symptoms she experiences in her forearm.  She did get some improvement with the subacromial injection. -Counseled on home exercise therapy and supportive care. -Referral to physical therapy. -May need to consider further imaging.  Primary osteoarthritis of left  knee Has a history of previous injury.  He does feel like her knee will give out from time to time.  Likely has component of degenerative changes with patellofemoral symptoms. -Counseled on home exercise therapy and supportive care. -Referral to physical therapy. -Can consider imaging and injection.  Acute bilateral low back pain without sciatica Acutely occurring.  Likely spasm in nature.  No signs of radicular changes at this point. -Counseled on home exercise therapy and supportive care. -Referral to physical therapy. -Could consider imaging.  Impingement syndrome of right shoulder She did get some improvement of her symptoms with the steroid injection.  However has pain in the forearm that is still ongoing. -Counseled on home exercise therapy and supportive care. -Referral to physical therapy

## 2019-10-13 NOTE — Assessment & Plan Note (Signed)
Acutely occurring.  Likely spasm in nature.  No signs of radicular changes at this point. -Counseled on home exercise therapy and supportive care. -Referral to physical therapy. -Could consider imaging.

## 2019-10-13 NOTE — Patient Instructions (Signed)
Good to see you Please continue tylenol and ibuprofen  Please try heat  Physical therapy will give you a call  Please send me a message in MyChart with any questions or updates.  Please see me back in 4 weeks.   --Dr. Raeford Razor

## 2019-10-13 NOTE — Assessment & Plan Note (Signed)
She did get some improvement of her symptoms with the steroid injection.  However has pain in the forearm that is still ongoing. -Counseled on home exercise therapy and supportive care. -Referral to physical therapy

## 2019-10-13 NOTE — Assessment & Plan Note (Signed)
Has a history of previous injury.  He does feel like her knee will give out from time to time.  Likely has component of degenerative changes with patellofemoral symptoms. -Counseled on home exercise therapy and supportive care. -Referral to physical therapy. -Can consider imaging and injection.

## 2019-10-14 ENCOUNTER — Ambulatory Visit: Payer: Medicare Other | Admitting: Family Medicine

## 2019-10-14 MED ORDER — BACLOFEN 10 MG PO TABS: 5.0000 mg | ORAL_TABLET | Freq: Two times a day (BID) | ORAL | 0 refills | Status: DC | PRN

## 2019-10-14 NOTE — Telephone Encounter (Signed)
Provided baclofen.   Rosemarie Ax, MD [facility] 10/14/2019, 7:58 AM

## 2019-10-25 ENCOUNTER — Encounter: Payer: Self-pay | Admitting: Physical Therapy

## 2019-10-25 ENCOUNTER — Ambulatory Visit: Payer: Medicare Other | Attending: Family Medicine | Admitting: Physical Therapy

## 2019-10-25 ENCOUNTER — Other Ambulatory Visit: Payer: Self-pay

## 2019-10-25 ENCOUNTER — Other Ambulatory Visit: Payer: Self-pay | Admitting: Medical

## 2019-10-25 DIAGNOSIS — M25511 Pain in right shoulder: Secondary | ICD-10-CM | POA: Diagnosis not present

## 2019-10-25 DIAGNOSIS — M25562 Pain in left knee: Secondary | ICD-10-CM | POA: Insufficient documentation

## 2019-10-25 DIAGNOSIS — R293 Abnormal posture: Secondary | ICD-10-CM | POA: Diagnosis present

## 2019-10-25 DIAGNOSIS — G8929 Other chronic pain: Secondary | ICD-10-CM | POA: Diagnosis present

## 2019-10-25 DIAGNOSIS — M6281 Muscle weakness (generalized): Secondary | ICD-10-CM | POA: Diagnosis present

## 2019-10-25 DIAGNOSIS — M545 Low back pain, unspecified: Secondary | ICD-10-CM | POA: Diagnosis present

## 2019-10-25 DIAGNOSIS — R29898 Other symptoms and signs involving the musculoskeletal system: Secondary | ICD-10-CM

## 2019-10-25 DIAGNOSIS — M5412 Radiculopathy, cervical region: Secondary | ICD-10-CM | POA: Diagnosis present

## 2019-10-25 NOTE — Patient Instructions (Signed)
    Home exercise program created by Jlee Harkless, PT.  For questions, please contact [facility] [place] via phone at [phone] or email at [email or web address]  [facility] [place] [address]  [address] [place], [place], [address] Phone: [phone]   Fax:  [phone]    

## 2019-10-25 NOTE — Therapy (Signed)
[facility] [address]  [place], [place], [address] Phone: [phone]   Fax:  [phone]  Physical Therapy Evaluation  Patient Details  Name: Nataliya Graig MRN: [id number] Date of Birth: 30-Jan-1950 Referring Provider (PT): Clearance Coots, MD   Encounter Date: 10/25/2019   PT End of Session - 10/25/19 0845    Visit Number 1    Number of Visits 16    Date for PT Re-Evaluation 12/20/19    Authorization Type Medicare & State BCBS    PT Start Time 0845    PT Stop Time 1015    PT Time Calculation (min) 90 min    Activity Tolerance Patient tolerated treatment well    Behavior During Therapy [facility] for tasks assessed/performed           Past Medical History:  Diagnosis Date  . Anemia   . Arthritis    Thumbs  . Hyperlipidemia   . Hypertension   . Morbid obesity with BMI of 50.0-59.9, adult ([place])   . Pneumonia     Past Surgical History:  Procedure Laterality Date  . ABDOMINAL HYSTERECTOMY    . COLONOSCOPY    . OOPHORECTOMY    . WISDOM TOOTH EXTRACTION      There were no vitals filed for this visit.    Subjective Assessment - 10/25/19 0852    Subjective Charnell reports her main reason for coming to PT was pain in R UT but reports injection to shoulder finally gave her some relief after about a week. Also notes LBP and tightness which feels like it is pulling her into a flexed position. L knee pain longstanding but intermittent - will occasionally cause her knee to buckle.    Patient Stated Goals "to be able to walk well enough for her trip to [facility] on Dec 8"    Currently in Pain? No/denies    Pain Score 0-No pain   "twinge" up to 2-3/10 since injection   Pain Location Shoulder    Pain Orientation Right;Upper    Pain Descriptors / Indicators Other (Comment)   "twinge"   Pain Type Acute pain    Pain Radiating Towards prior to injection was having radicular pain down R arm but denies numbness/tingling     Pain Onset More than a month ago   mid July   Pain Frequency Occasional    Aggravating Factors  unpredictable    Pain Relieving Factors was taking ibuprofen but has not needed any recently    Effect of Pain on Daily Activities not currently limited    Pain Score 0   up to 8/10   Pain Location Back    Pain Orientation Lower   bilateral   Pain Descriptors / Indicators Aching    Pain Type Acute pain    Pain Radiating Towards n/a    Pain Onset More than a month ago   2-3 months   Pain Frequency Intermittent    Aggravating Factors  fatigue - tired over "overdo things"    Pain Relieving Factors does not medicate for back pain    Effect of Pain on Daily Activities has to go slow    Pain Score 0   up to 2/10   Pain Location Knee    Pain Orientation Left    Pain Type Chronic pain    Pain Radiating Towards n/a    Pain Onset Other (comment)   onset after she twisted her knee ~4-6 yrs ago  Pain Frequency Occasional    Aggravating Factors  unpredictable    Effect of Pain on Daily Activities feels like her knee will buckle and "throw her to the ground"              [facility] PT Assessment - 10/25/19 0845      Assessment   Medical Diagnosis Acute LBP, cervical radiculopathy, R shoulder impingement, L knee pain/instability    Referring Provider (PT) Clearance Coots, MD    Onset Date/Surgical Date --   varies but grossly 2-3 months   Next MD Visit 11/11/19    Prior Therapy does not recall      Balance Screen   Has the patient fallen in the past 6 months No    Has the patient had a decrease in activity level because of a fear of falling?  Yes    Is the patient reluctant to leave their home because of a fear of falling?  No      Home Environment   Living Environment Private residence    Living Arrangements Other relatives    Type of [place] to enter    Entrance Stairs-Number of Steps 1 + threshold    Entrance Stairs-Rails None   uses doorframe   Home Layout Two  level;Laundry or work area in basement;Able to live on main level with [employer] - single point      Prior Function   Level of [place] Retired    Leisure mostly sedentary - watches TV from bed; shopping      Cognition   Overall Cognitive Status Within Functional Limits for tasks assessed      Posture/Postural Control   Posture/Postural Control Postural limitations    Postural Limitations Forward head;Rounded Shoulders;Flexed trunk      ROM / Strength   AROM / PROM / Strength AROM;Strength      AROM   AROM Assessment Site Cervical;Lumbar;Shoulder    Right/Left Shoulder --   B shoulder ROM WFL   Cervical Flexion 58    Cervical Extension 32    Cervical - Right Side Bend 19    Cervical - Left Side Bend 15    Cervical - Right Rotation 52    Cervical - Left Rotation 49    Lumbar Flexion fingertip to floor    Lumbar Extension 50% limited    Lumbar - Right Side Bend hand to lateral jt line of knee    Lumbar - Left Side Bend hand to lateral jt line of knee    Lumbar - Right Rotation 40% limited    Lumbar - Left Rotation 10% limited      Strength   Overall Strength Comments testing     Strength Assessment Site Shoulder;Hip;Knee;Ankle    Right/Left Shoulder Right;Left    Right Shoulder Flexion 4-/5    Right Shoulder ABduction 4/5    Right Shoulder Internal Rotation 4+/5    Right Shoulder External Rotation 4+/5    Left Shoulder Flexion 4-/5    Left Shoulder ABduction 4/5    Left Shoulder Internal Rotation 4+/5    Left Shoulder External Rotation 4+/5    Right/Left Hip Right;Left    Right Hip Flexion 4-/5    Right Hip Extension 4-/5    Right Hip ABduction 4/5    Right Hip ADduction 3+/5    Left Hip Flexion 4-/5    Left Hip Extension 4-/5  Left Hip ABduction 4/5    Left Hip ADduction 3+/5    Right/Left Knee Right;Left    Right Knee Flexion 4/5    Right Knee Extension 4/5    Left Knee Flexion 4/5    Left Knee  Extension 4/5    Right/Left Ankle Right;Left    Right Ankle Dorsiflexion 4+/5    Left Ankle Dorsiflexion 4+/5      Flexibility   Soft Tissue Assessment /Muscle Length yes    Hamstrings mild tight B    ITB mild tight B      Ambulation/Gait   Assistive device Straight cane    Gait Pattern Step-through pattern;Trunk flexed;Decreased stride length;Decreased hip/knee flexion - right;Decreased hip/knee flexion - left;Lateral trunk lean to right;Lateral trunk lean to left;Decreased trunk rotation    Ambulation Surface Level;Indoor    Gait velocity decreased                      Objective measurements completed on examination: See above findings.       [facility] Adult PT Treatment/Exercise - 10/25/19 0845      Ambulation/Gait   Gait Comments Pt using dual handled cane on upper handle but cane ~4 inches to tall for her and not adjustable to proper height causing R shoulder shrug/elevation when ambulating. Instructed pt in proper height adjustment for cane to rest at crease of wrist with arm fully extended - modified current cane to allow pt to use lower handle at proper height with shoulder alignment much improved.       Neck Exercises: Seated   Neck Retraction 5 reps;5 secs      Lumbar Exercises: Stretches   Passive Hamstring Stretch Right;30 seconds;1 rep   pt to complete Bil at home   Lower Trunk Rotation 30 seconds;2 reps    ITB Stretch Right;30 seconds;1 rep    Other Lumbar Stretch Exercise Open book stretch 5 x 5"       Lumbar Exercises: Supine   Pelvic Tilt 5 reps;5 seconds      Shoulder Exercises: Seated   Retraction Both;5 reps;Strengthening    Retraction Limitations scap retraction + depression      Neck Exercises: Stretches   Upper Trapezius Stretch Right;Left;30 seconds;1 rep    Levator Stretch Right;Left;30 seconds;1 rep                  PT Education - 10/25/19 1100    Education Details PT eval findings, anticipated POC & initial HEP     Person(s) Educated Patient    Methods Explanation;Demonstration;Verbal cues;Handout    Comprehension Verbalized understanding;Verbal cues required;Returned demonstration;Need further instruction            PT Short Term Goals - 10/25/19 1159      PT SHORT TERM GOAL #1   Title Patient will be independent with initial HEP    Status New    Target Date 11/15/19      PT SHORT TERM GOAL #2   Title Patient will verbalize/demonstrate understanding of neutral spine posture and proper body mechanics to reduce strain on cervical & lumbar spine    Status New    Target Date 11/15/19             PT Long Term Goals - 10/25/19 1206      PT LONG TERM GOAL #1   Title Patient will be independent with ongoing/advanced HEP +/- gym program    Status New    Target Date 12/20/19  PT LONG TERM GOAL #2   Title Patient will verbalize/demonstrate good awareness of neutral spine posture and proper body mechanics for daily tasks    Status New    Target Date 12/20/19      PT LONG TERM GOAL #3   Title Patient will demonstrate improved B strength to >/= 4+/5 for improved postural stability and functional UE use    Status New    Target Date 12/20/19      PT LONG TERM GOAL #4   Title Patient will demonstrate improved B LE strength to >/= 4 to 4+/5 for improved stability and ease of mobility    Status New    Target Date 12/20/19      PT LONG TERM GOAL #5   Title Patient will ambulate with good posture and normal gait pattern with or w/o LRAD w/o evidence of L knee instability to allow for pt to go on trip in December    Status New    Target Date 12/20/19                  Plan - 10/25/19 1100    Clinical Impression Statement Shylynn is a 69 y/o female who presents to OP PT with multiple diagnoses/problems including acute LBP, cervical radiculopathy, R shoulder impingement, and L knee pain/instability. She states her primary concern was the pain in her R upper shoulder which began in  mid-July w/o known trigger but notes she has had significant relief from recent injection performed by MD, although notes it took about a week for benefit to be noted. She still will experience an occasional "twinge" in her R shoulder but is unable to identify movement or activity that will elicit the twinge. She also notes acute LBP w/o sciatica originating ~2-3 months ago, again w/o known trigger or MOI and states that pain persists on intermittent basis, most notable when she is more fatigued. The issue with her L knee is less frequent but will sometimes catch her off guard and cause her knee to feel like it will buckle. Deficits include postural deficits including forward head and rounded shoulders with R shoulder excessively elevated and trunk flexed when using cane, limitations in cervical and lumbar ROM, decreased proximal flexibility with increased muscle tension in R UT, as well as proximal UE and LE weakness. She admits to a mostly sedentary lifestyle spending a fair amount of time propped on pillows in bed watching TV or playing games on her phone but would like to take advantage of her insurance's [employer] program and start using a gym. Svetlana will benefit from skilled PT to address above deficits and allow for increased participation in desired activities with decreased pain interference.    Personal Factors and Comorbidities Comorbidity 3+;Time since onset of injury/illness/exacerbation;Fitness;Age    Comorbidities multiple active problems (LBP, cervical radiculopathy, R shoulder impingement, L knee pain/instability) + HTN, OA, morbid obesity, DM    Examination-Activity Limitations Bed Mobility;Bend;Caring for Others;Carry;Locomotion Level;Reach Overhead;Sleep;Stairs;Transfers    Examination-Participation Restrictions Cleaning;Community Activity;Meal Prep;Laundry;Shop    Stability/Clinical Decision Making Evolving/Moderate complexity    Clinical Decision Making Moderate    Rehab Potential  Good    PT Frequency 2x / week    PT Duration 8 weeks    PT Treatment/Interventions ADLs/Self Care Home Management;Cryotherapy;Electrical Stimulation;Iontophoresis 4mg /ml Dexamethasone;Moist Heat;Traction;Ultrasound;DME Instruction;Gait training;Stair training;Functional mobility training;Therapeutic activities;Therapeutic exercise;Balance training;Neuromuscular re-education;Patient/family education;Manual techniques;Passive range of motion;Dry needling;Taping;Vasopneumatic Device;Spinal Manipulations;Joint Manipulations    PT Next Visit Plan review initial HEP; posture and body mechanics  education; progress postural and core/lumbopelvic strengthening; manual therapy and modalities PRN    PT Home Exercise Plan 10/12 - HS, ITB, LTR, open book, UT & LS stretches, pelvic tilt, chin tuck & scap retraction    Consulted and Agree with Plan of Care Patient           Patient will benefit from skilled therapeutic intervention in order to improve the following deficits and impairments:  Abnormal gait, Decreased activity tolerance, Decreased balance, Decreased endurance, Decreased mobility, Decreased range of motion, Decreased strength, Difficulty walking, Increased fascial restricitons, Increased muscle spasms, Impaired perceived functional ability, Impaired flexibility, Impaired UE functional use, Improper body mechanics, Postural dysfunction, Pain  Visit Diagnosis: Acute pain of right shoulder  Radiculopathy, cervical region  Acute bilateral low back pain without sciatica  Chronic pain of left knee  Muscle weakness (generalized)  Other symptoms and signs involving the musculoskeletal system  Abnormal posture     Problem List Patient Active Problem List   Diagnosis Date Noted  . Primary osteoarthritis of left knee 10/13/2019  . Acute bilateral low back pain without sciatica 10/13/2019  . Impingement syndrome of right shoulder 10/05/2019  . Cervical radiculopathy 09/27/2019  . Essential  hypertension   . Pure hypercholesterolemia   . Chest pain 10/24/2017  . Morbid obesity ([place]) 10/24/2017  . Passive smoke exposure 10/24/2017  . Sinus congestion 10/24/2017    Percival Spanish, PT, MPT 10/25/2019, 12:14 PM  [facility] [address]  [place], [place], [address] Phone: [phone]   Fax:  [phone]  Name: Shealee Yordy MRN: [id number] Date of Birth: 06-Dec-1950

## 2019-10-26 ENCOUNTER — Ambulatory Visit: Payer: Medicare Other | Admitting: Family Medicine

## 2019-10-28 ENCOUNTER — Ambulatory Visit: Payer: Medicare Other | Admitting: Physical Therapy

## 2019-11-01 ENCOUNTER — Other Ambulatory Visit: Payer: Self-pay

## 2019-11-01 ENCOUNTER — Ambulatory Visit: Payer: Medicare Other

## 2019-11-01 DIAGNOSIS — R293 Abnormal posture: Secondary | ICD-10-CM

## 2019-11-01 DIAGNOSIS — G8929 Other chronic pain: Secondary | ICD-10-CM

## 2019-11-01 DIAGNOSIS — M6281 Muscle weakness (generalized): Secondary | ICD-10-CM

## 2019-11-01 DIAGNOSIS — M25511 Pain in right shoulder: Secondary | ICD-10-CM

## 2019-11-01 DIAGNOSIS — R29898 Other symptoms and signs involving the musculoskeletal system: Secondary | ICD-10-CM

## 2019-11-01 DIAGNOSIS — M5412 Radiculopathy, cervical region: Secondary | ICD-10-CM

## 2019-11-01 DIAGNOSIS — M545 Low back pain, unspecified: Secondary | ICD-10-CM

## 2019-11-01 NOTE — Therapy (Signed)
[facility] [address]  [place], [place], [address] Phone: [phone]   Fax:  [phone]  Physical Therapy Treatment  Patient Details  Name: Amber Huffman MRN: [id number] Date of Birth: 27-Sep-1950 Referring Provider (PT): Clearance Coots, MD   Encounter Date: 11/01/2019   PT End of Session - 11/01/19 0945    Visit Number 2    Number of Visits 16    Date for PT Re-Evaluation 12/20/19    Authorization Type Medicare & State BCBS    PT Start Time [phone]    PT Stop Time 1020    PT Time Calculation (min) 44 min    Activity Tolerance Patient tolerated treatment well    Behavior During Therapy [facility] for tasks assessed/performed           Past Medical History:  Diagnosis Date   Anemia    Arthritis    Thumbs   Hyperlipidemia    Hypertension    Morbid obesity with BMI of 50.0-59.9, adult ([place])    Pneumonia     Past Surgical History:  Procedure Laterality Date   ABDOMINAL HYSTERECTOMY     COLONOSCOPY     OOPHORECTOMY     WISDOM TOOTH EXTRACTION      There were no vitals filed for this visit.   Subjective Assessment - 11/01/19 0944    Subjective Admitting to poor HEP adherence.    Patient Stated Goals "to be able to walk well enough for her trip to [facility] on Dec 8"    Currently in Pain? No/denies    Pain Score 0-No pain    Pain Orientation Right;Upper    Multiple Pain Sites No    Pain Score 0   pain up to 8/10 at times   Pain Location Back    Pain Orientation Lower    Pain Descriptors / Indicators Aching    Pain Score 0   pain up to a 2/10   Pain Location Knee    Pain Orientation Left    Pain Descriptors / Indicators Sharp   "it will talk to you"   Pain Type Chronic pain                             [facility] Adult PT Treatment/Exercise - 11/01/19 0001      Neck Exercises: Seated   Neck Retraction 10 reps;5 secs      Lumbar Exercises: Stretches   Passive Hamstring  Stretch Right;Left;1 rep;30 seconds    Passive Hamstring Stretch Limitations supine with strap     Lower Trunk Rotation Limitations 5" x 10 reps     ITB Stretch Right;Left;1 rep;30 seconds    ITB Stretch Limitations supine with strap       Lumbar Exercises: Aerobic   Nustep lvl 2, 6 min (UE/LE)      Lumbar Exercises: Sidelying   Other Sidelying Lumbar Exercises B "open book" stretch 5" x 5      Shoulder Exercises: Seated   Retraction Both;10 reps;Strengthening    Retraction Limitations scap retraction + depression      Neck Exercises: Stretches   Upper Trapezius Stretch Right;Left;30 seconds;1 rep    Levator Stretch Right;Left;30 seconds;1 rep                    PT Short Term Goals - 11/01/19 0954      PT SHORT TERM GOAL #1   Title  Patient will be independent with initial HEP    Status On-going    Target Date 11/15/19      PT SHORT TERM GOAL #2   Title Patient will verbalize/demonstrate understanding of neutral spine posture and proper body mechanics to reduce strain on cervical & lumbar spine    Status On-going    Target Date 11/15/19             PT Long Term Goals - 11/01/19 0955      PT LONG TERM GOAL #1   Title Patient will be independent with ongoing/advanced HEP +/- gym program    Status On-going      PT LONG TERM GOAL #2   Title Patient will verbalize/demonstrate good awareness of neutral spine posture and proper body mechanics for daily tasks    Status On-going      PT LONG TERM GOAL #3   Title Patient will demonstrate improved B strength to >/= 4+/5 for improved postural stability and functional UE use    Status On-going      PT LONG TERM GOAL #4   Title Patient will demonstrate improved B LE strength to >/= 4 to 4+/5 for improved stability and ease of mobility    Status On-going      PT LONG TERM GOAL #5   Title Patient will ambulate with good posture and normal gait pattern with or w/o LRAD w/o evidence of L knee instability to allow for  pt to go on trip in December    Status On-going                 Plan - 11/01/19 [place] doing ok.  Feels she would like to concentrate on her LBP today as this is most concerning to her.  Reviewed HEP to check for proper technique as pt. admitting to poor adherence since last visit.  Minor technique correction required with supine ITB stretch and UT stretch to ensure proper stretch sensation however good carryover.  Ended visit with no increased pain.    Comorbidities multiple active problems (LBP, cervical radiculopathy, R shoulder impingement, L knee pain/instability) + HTN, OA, morbid obesity, DM    Rehab Potential Good    PT Frequency 2x / week    PT Duration 8 weeks    PT Treatment/Interventions ADLs/Self Care Home Management;Cryotherapy;Electrical Stimulation;Iontophoresis 4mg /ml Dexamethasone;Moist Heat;Traction;Ultrasound;DME Instruction;Gait training;Stair training;Functional mobility training;Therapeutic activities;Therapeutic exercise;Balance training;Neuromuscular re-education;Patient/family education;Manual techniques;Passive range of motion;Dry needling;Taping;Vasopneumatic Device;Spinal Manipulations;Joint Manipulations    PT Next Visit Plan Posture and body mechanics education; progress postural and core/lumbopelvic strengthening; manual therapy and modalities PRN    PT Home Exercise Plan 10/12 - HS, ITB, LTR, open book, UT & LS stretches, pelvic tilt, chin tuck & scap retraction    Consulted and Agree with Plan of Care Patient           Patient will benefit from skilled therapeutic intervention in order to improve the following deficits and impairments:  Abnormal gait, Decreased activity tolerance, Decreased balance, Decreased endurance, Decreased mobility, Decreased range of motion, Decreased strength, Difficulty walking, Increased fascial restricitons, Increased muscle spasms, Impaired perceived functional ability, Impaired  flexibility, Impaired UE functional use, Improper body mechanics, Postural dysfunction, Pain  Visit Diagnosis: Acute pain of right shoulder  Radiculopathy, cervical region  Acute bilateral low back pain without sciatica  Chronic pain of left knee  Muscle weakness (generalized)  Other symptoms and signs involving the musculoskeletal system  Abnormal posture  Problem List Patient Active Problem List   Diagnosis Date Noted   Primary osteoarthritis of left knee 10/13/2019   Acute bilateral low back pain without sciatica 10/13/2019   Impingement syndrome of right shoulder 10/05/2019   Cervical radiculopathy 09/27/2019   Essential hypertension    Pure hypercholesterolemia    Chest pain 10/24/2017   Morbid obesity ([place]) 10/24/2017   Passive smoke exposure 10/24/2017   Sinus congestion 10/24/2017    Bess Harvest, PTA 11/01/19 10:32 AM   [facility] [address]  [place], [place], [address] Phone: [phone]   Fax:  [phone]  Name: Amber Huffman MRN: [id number] Date of Birth: 1950-03-21

## 2019-11-03 ENCOUNTER — Encounter: Payer: Self-pay | Admitting: Gastroenterology

## 2019-11-03 ENCOUNTER — Ambulatory Visit (AMBULATORY_SURGERY_CENTER): Payer: Medicare Other | Admitting: Gastroenterology

## 2019-11-03 ENCOUNTER — Other Ambulatory Visit: Payer: Self-pay

## 2019-11-03 VITALS — BP 126/75 | HR 76 | Temp 96.9°F | Resp 12 | Ht <= 58 in | Wt 211.0 lb

## 2019-11-03 DIAGNOSIS — K317 Polyp of stomach and duodenum: Secondary | ICD-10-CM | POA: Diagnosis not present

## 2019-11-03 DIAGNOSIS — R1013 Epigastric pain: Secondary | ICD-10-CM

## 2019-11-03 DIAGNOSIS — K2970 Gastritis, unspecified, without bleeding: Secondary | ICD-10-CM

## 2019-11-03 DIAGNOSIS — D124 Benign neoplasm of descending colon: Secondary | ICD-10-CM | POA: Diagnosis not present

## 2019-11-03 DIAGNOSIS — R634 Abnormal weight loss: Secondary | ICD-10-CM

## 2019-11-03 DIAGNOSIS — Z1211 Encounter for screening for malignant neoplasm of colon: Secondary | ICD-10-CM | POA: Diagnosis present

## 2019-11-03 DIAGNOSIS — K449 Diaphragmatic hernia without obstruction or gangrene: Secondary | ICD-10-CM | POA: Diagnosis not present

## 2019-11-03 DIAGNOSIS — K2950 Unspecified chronic gastritis without bleeding: Secondary | ICD-10-CM | POA: Diagnosis not present

## 2019-11-03 MED ORDER — SODIUM CHLORIDE 0.9 % IV SOLN: 500.0000 mL | Freq: Once | INTRAVENOUS | Status: DC

## 2019-11-03 NOTE — Patient Instructions (Signed)
YOU HAD AN ENDOSCOPIC PROCEDURE TODAY AT [facility]:   Refer to the procedure report that was given to you for any specific questions about what was found during the examination.  If the procedure report does not answer your questions, please call your gastroenterologist to clarify.  If you requested that your care partner not be given the details of your procedure findings, then the procedure report has been included in a sealed envelope for you to review at your convenience later.  YOU SHOULD EXPECT: Some feelings of bloating in the abdomen. Passage of more gas than usual.  Walking can help get rid of the air that was put into your GI tract during the procedure and reduce the bloating. If you had a lower endoscopy (such as a colonoscopy or flexible sigmoidoscopy) you may notice spotting of blood in your stool or on the toilet paper. If you underwent a bowel prep for your procedure, you may not have a normal bowel movement for a few days.  Please Note:  You might notice some irritation and congestion in your nose or some drainage.  This is from the oxygen used during your procedure.  There is no need for concern and it should clear up in a day or so.  SYMPTOMS TO REPORT IMMEDIATELY:   Following lower endoscopy (colonoscopy or flexible sigmoidoscopy):  Excessive amounts of blood in the stool  Significant tenderness or worsening of abdominal pains  Swelling of the abdomen that is new, acute  Fever of 100F or higher   Following upper endoscopy (EGD)  Vomiting of blood or coffee ground material  New chest pain or pain under the shoulder blades  Painful or persistently difficult swallowing  New shortness of breath  Fever of 100F or higher  Black, tarry-looking stools  For urgent or emergent issues, a gastroenterologist can be reached at any hour by calling [phone]. Do not use MyChart messaging for urgent concerns.    DIET:  We do recommend a small meal at first, but  then you may proceed to your regular diet.  Drink plenty of fluids but you should avoid alcoholic beverages for 24 hours.  MEDICATIONS: Continue present medications.  Please see handouts given to you by your recovery nurse.  ACTIVITY:  You should plan to take it easy for the rest of today and you should NOT DRIVE or use heavy machinery until tomorrow (because of the sedation medicines used during the test).    FOLLOW UP: Our staff will call the number listed on your records 48-72 hours following your procedure to check on you and address any questions or concerns that you may have regarding the information given to you following your procedure. If we do not reach you, we will leave a message.  We will attempt to reach you two times.  During this call, we will ask if you have developed any symptoms of COVID 19. If you develop any symptoms (ie: fever, flu-like symptoms, shortness of breath, cough etc.) before then, please call [phone].  If you test positive for Covid 19 in the 2 weeks post procedure, please call and report this information to [place].    If any biopsies were taken you will be contacted by phone or by letter within the next 1-3 weeks.  Please call us at [phone] if you have not heard about the biopsies in 3 weeks.  Thank you for allowing [place] to provide for your healthcare needs today.    SIGNATURES/CONFIDENTIALITY: You  and/or your care partner have signed paperwork which will be entered into your electronic medical record.  These signatures attest to the fact that that the information above on your After Visit Summary has been reviewed and is understood.  Full responsibility of the confidentiality of this discharge information lies with you and/or your care-partner.

## 2019-11-03 NOTE — Op Note (Signed)
[place] Patient Name: Amber Huffman Procedure Date: 11/03/2019 1:32 PM MRN: [id number] Endoscopist: Jackquline Denmark , MD Age: 69 Referring MD:  Date of Birth: May 23, 1950 Gender: Female Account #: [id number] Procedure:                Colonoscopy Indications:              Screening for colorectal malignant neoplasm Medicines:                Monitored Anesthesia Care Procedure:                Pre-Anesthesia Assessment:                           - Prior to the procedure, a History and Physical                            was performed, and patient medications and                            allergies were reviewed. The patient's tolerance of                            previous anesthesia was also reviewed. The risks                            and benefits of the procedure and the sedation                            options and risks were discussed with the patient.                            All questions were answered, and informed consent                            was obtained. Prior Anticoagulants: The patient has                            taken no previous anticoagulant or antiplatelet                            agents. ASA Grade Assessment: II - A patient with                            mild systemic disease. After reviewing the risks                            and benefits, the patient was deemed in                            satisfactory condition to undergo the procedure.                           After obtaining informed consent, the colonoscope  was passed under direct vision. Throughout the                            procedure, the patient's blood pressure, pulse, and                            oxygen saturations were monitored continuously. The                            Colonoscope was introduced through the anus and                            advanced to the 2 cm into the ileum. The                            colonoscopy was performed  without difficulty. The                            patient tolerated the procedure well. The quality                            of the bowel preparation was good. The terminal                            ileum, ileocecal valve, appendiceal orifice, and                            rectum were photographed. Scope In: 1:50:08 PM Scope Out: 2:00:18 PM Scope Withdrawal Time: 0 hours 8 minutes 5 seconds  Total Procedure Duration: 0 hours 10 minutes 10 seconds  Findings:                 Two sessile polyps were found in the mid descending                            colon. The polyps were 4 to 6 mm in size. These                            polyps were removed with a cold snare. Resection                            and retrieval were complete.                           A few rare small-mouthed diverticula were found in                            the sigmoid colon.                           Non-bleeding internal hemorrhoids were found during                            retroflexion. The hemorrhoids were small.  The terminal ileum appeared normal.                           The exam was otherwise without abnormality on                            direct and retroflexion views. Complications:            No immediate complications. Estimated Blood Loss:     Estimated blood loss: none. Impression:               - Two 4 to 6 mm polyps in the mid descending colon,                            removed with a cold snare. Resected and retrieved.                           - Minimal sigmoid diverticulosis.Marland Kitchen                           - Otherwise normal colonoscopy to TI. Recommendation:           - Patient has a contact number available for                            emergencies. The signs and symptoms of potential                            delayed complications were discussed with the                            patient. Return to normal activities tomorrow.                             Written discharge instructions were provided to the                            patient.                           - Resume previous diet.                           - Continue present medications.                           - Await pathology results.                           - Repeat colonoscopy for surveillance based on                            pathology results.                           - The findings and recommendations were discussed  with the patient's family. Jackquline Denmark, MD 11/03/2019 2:08:23 PM This report has been signed electronically.

## 2019-11-03 NOTE — Op Note (Signed)
[place] Patient Name: Amber Huffman Procedure Date: 11/03/2019 1:33 PM MRN: [id number] Endoscopist: Jackquline Denmark , MD Age: 69 Referring MD:  Date of Birth: 08/13/1950 Gender: Female Account #: [id number] Procedure:                Upper GI endoscopy Indications:              Abdominal pain in the right upper quadrant Medicines:                Monitored Anesthesia Care Procedure:                Pre-Anesthesia Assessment:                           - Prior to the procedure, a History and Physical                            was performed, and patient medications and                            allergies were reviewed. The patient's tolerance of                            previous anesthesia was also reviewed. The risks                            and benefits of the procedure and the sedation                            options and risks were discussed with the patient.                            All questions were answered, and informed consent                            was obtained. Prior Anticoagulants: The patient has                            taken no previous anticoagulant or antiplatelet                            agents. ASA Grade Assessment: II - A patient with                            mild systemic disease. After reviewing the risks                            and benefits, the patient was deemed in                            satisfactory condition to undergo the procedure.                           After obtaining informed consent, the endoscope was  passed under direct vision. Throughout the                            procedure, the patient's blood pressure, pulse, and                            oxygen saturations were monitored continuously. The                            Endoscope was introduced through the mouth, and                            advanced to the second part of duodenum. The upper                            GI endoscopy  was accomplished without difficulty.                            The patient tolerated the procedure well. Scope In: Scope Out: Findings:                 The examined esophagus was normal with well-defined                            Z-line at 32 cm.                           A 2 cm transient hiatal hernia was present.                           Localized minimal inflammation characterized by                            erythema was found in the gastric antrum. Biopsies                            were taken with a cold forceps for histology.                           A few 2 to 4 mm sessile polyps with no stigmata of                            recent bleeding were found in the cardia and in the                            gastric body. 3 polyps were removed with a cold                            biopsy forceps. Resection and retrieval were                            complete.  The examined duodenum was normal. Biopsies for                            histology were taken with a cold forceps for                            evaluation of celiac disease. Complications:            No immediate complications. Estimated Blood Loss:     Estimated blood loss: none. Impression:               - Small hiatal hernia.                           - Mild gastritis.                           - Incidental small gastric polyps. (Resected and                            retrieved x 3). Recommendation:           - Patient has a contact number available for                            emergencies. The signs and symptoms of potential                            delayed complications were discussed with the                            patient. Return to normal activities tomorrow.                            Written discharge instructions were provided to the                            patient.                           - Resume previous diet.                           - Continue present  medications.                           - Await pathology results.                           - The findings and recommendations were discussed                            with the patient's family. If he has any recurrent                            right upper quadrant abdominal pain, would  recommend laparoscopic cholecystectomy. Jackquline Denmark, MD 11/03/2019 2:05:16 PM This report has been signed electronically.

## 2019-11-03 NOTE — Progress Notes (Signed)
robinol antisialogogue lidocaine buffer °

## 2019-11-03 NOTE — Progress Notes (Signed)
A and O x3. Report to RN. Tolerated MAC anesthesia well.Teeth unchanged after procedure.

## 2019-11-03 NOTE — Progress Notes (Signed)
VS-SP

## 2019-11-03 NOTE — Progress Notes (Signed)
Called to room to assist during endoscopic procedure.  Patient ID and intended procedure confirmed with present staff. Received instructions for my participation in the procedure from the performing physician.  

## 2019-11-04 ENCOUNTER — Ambulatory Visit: Payer: Medicare Other

## 2019-11-04 DIAGNOSIS — R29898 Other symptoms and signs involving the musculoskeletal system: Secondary | ICD-10-CM

## 2019-11-04 DIAGNOSIS — R293 Abnormal posture: Secondary | ICD-10-CM

## 2019-11-04 DIAGNOSIS — M25511 Pain in right shoulder: Secondary | ICD-10-CM

## 2019-11-04 DIAGNOSIS — M545 Low back pain, unspecified: Secondary | ICD-10-CM

## 2019-11-04 DIAGNOSIS — M5412 Radiculopathy, cervical region: Secondary | ICD-10-CM

## 2019-11-04 DIAGNOSIS — M6281 Muscle weakness (generalized): Secondary | ICD-10-CM

## 2019-11-04 DIAGNOSIS — M25562 Pain in left knee: Secondary | ICD-10-CM

## 2019-11-04 DIAGNOSIS — G8929 Other chronic pain: Secondary | ICD-10-CM

## 2019-11-04 NOTE — Therapy (Signed)
[facility] [address]  [place], [place], [address] Phone: [phone]   Fax:  [phone]  Physical Therapy Treatment  Patient Details  Name: Amber Huffman MRN: [id number] Date of Birth: Nov 15, 1950 Referring Provider (PT): Clearance Coots, MD   Encounter Date: 11/04/2019   PT End of Session - 11/04/19 0817    Visit Number 3    Number of Visits 16    Date for PT Re-Evaluation 12/20/19    Authorization Type Medicare & State BCBS    PT Start Time [phone]    PT Stop Time 0841    PT Time Calculation (min) 31 min    Activity Tolerance Patient tolerated treatment well    Behavior During Therapy [facility] for tasks assessed/performed           Past Medical History:  Diagnosis Date  . Allergy   . Anemia   . Anxiety   . Arthritis    Thumbs  . Blood transfusion without reported diagnosis   . Depression   . Diabetes mellitus without complication ([place])    type 2  . GERD (gastroesophageal reflux disease)   . Hyperlipidemia   . Hypertension   . Morbid obesity with BMI of 50.0-59.9, adult ([place])   . Osteoporosis   . Pneumonia   . Substance abuse ([place])    ETOH abuse, quit drinking in 1985    Past Surgical History:  Procedure Laterality Date  . ABDOMINAL HYSTERECTOMY    . CERVIX REMOVAL    . CESAREAN SECTION    . COLONOSCOPY    . OOPHORECTOMY    . UPPER GASTROINTESTINAL ENDOSCOPY    . WISDOM TOOTH EXTRACTION      There were no vitals filed for this visit.   Subjective Assessment - 11/04/19 0814    Subjective pt. reporting she just had a colonoscopy yesterday.    Patient Stated Goals "to be able to walk well enough for her trip to [facility] on Dec 8"    Currently in Pain? No/denies    Pain Score 0-No pain    Pain Location Shoulder    Pain Orientation Right;Upper    Pain Descriptors / Indicators --   "twinge"   Pain Type Acute pain    Pain Onset More than a month ago    Pain Frequency Intermittent    Multiple  Pain Sites No    Pain Score --   pain up to a 1/10 this morning   Pain Location Knee    Pain Orientation Left    Pain Descriptors / Indicators Sharp    Pain Type Chronic pain                             [facility] Adult PT Treatment/Exercise - 11/04/19 0001      Self-Care   Self-Care Other Self-Care Comments    Other Self-Care Comments  Instruction in proper posture and body mechanics with daily activities to reduce lumbar strain       Neck Exercises: Theraband   Rows 10 reps;Red    Rows Limitations good carryover for scpaular retraction/dep after cueing       Lumbar Exercises: Aerobic   Nustep lvl 3, 6 min (UE/LE)      Knee/Hip Exercises: Seated   Long Arc Quad Left;10 reps;Strengthening    Long Arc Quad Weight 2 lbs.    Long [employer] Limitations cues for [employer]  PT Short Term Goals - 11/01/19 0954      PT SHORT TERM GOAL #1   Title Patient will be independent with initial HEP    Status On-going    Target Date 11/15/19      PT SHORT TERM GOAL #2   Title Patient will verbalize/demonstrate understanding of neutral spine posture and proper body mechanics to reduce strain on cervical & lumbar spine    Status On-going    Target Date 11/15/19             PT Long Term Goals - 11/01/19 0955      PT LONG TERM GOAL #1   Title Patient will be independent with ongoing/advanced HEP +/- gym program    Status On-going      PT LONG TERM GOAL #2   Title Patient will verbalize/demonstrate good awareness of neutral spine posture and proper body mechanics for daily tasks    Status On-going      PT LONG TERM GOAL #3   Title Patient will demonstrate improved B strength to >/= 4+/5 for improved postural stability and functional UE use    Status On-going      PT LONG TERM GOAL #4   Title Patient will demonstrate improved B LE strength to >/= 4 to 4+/5 for improved stability and ease of mobility    Status On-going      PT LONG TERM GOAL #5    Title Patient will ambulate with good posture and normal gait pattern with or w/o LRAD w/o evidence of L knee instability to allow for pt to go on trip in December    Status On-going                 Plan - 11/04/19 0820    [facility] [place] doing ok.  Arrived late to session today thus tx time limited.  Denies current pain however L knee hurt her at a 1/10 pain this morning without known trigger.  "I think it's the weather".  Focused session on instruction in proper posture and body mechanics with daily activities to reduce lumbar strain.  Pt. verbalizing that she likes using the NuStep recumbent stepper for aerobic exercise thus will continue warmup on this machine as able.    Comorbidities multiple active problems (LBP, cervical radiculopathy, R shoulder impingement, L knee pain/instability) + HTN, OA, morbid obesity, DM    Rehab Potential Good    PT Frequency 2x / week    PT Duration 8 weeks    PT Treatment/Interventions ADLs/Self Care Home Management;Cryotherapy;Electrical Stimulation;Iontophoresis 4mg /ml Dexamethasone;Moist Heat;Traction;Ultrasound;DME Instruction;Gait training;Stair training;Functional mobility training;Therapeutic activities;Therapeutic exercise;Balance training;Neuromuscular re-education;Patient/family education;Manual techniques;Passive range of motion;Dry needling;Taping;Vasopneumatic Device;Spinal Manipulations;Joint Manipulations    PT Next Visit Plan Progress postural and core/lumbopelvic strengthening; manual therapy and modalities PRN    PT Home Exercise Plan 10/12 - HS, ITB, LTR, open book, UT & LS stretches, pelvic tilt, chin tuck & scap retraction    Consulted and Agree with Plan of Care Patient           Patient will benefit from skilled therapeutic intervention in order to improve the following deficits and impairments:  Abnormal gait, Decreased activity tolerance, Decreased balance, Decreased endurance, Decreased mobility,  Decreased range of motion, Decreased strength, Difficulty walking, Increased fascial restricitons, Increased muscle spasms, Impaired perceived functional ability, Impaired flexibility, Impaired UE functional use, Improper body mechanics, Postural dysfunction, Pain  Visit Diagnosis: Acute pain of right shoulder  Radiculopathy, cervical region  Acute bilateral low  back pain without sciatica  Chronic pain of left knee  Muscle weakness (generalized)  Other symptoms and signs involving the musculoskeletal system  Abnormal posture     Problem List Patient Active Problem List   Diagnosis Date Noted  . Primary osteoarthritis of left knee 10/13/2019  . Acute bilateral low back pain without sciatica 10/13/2019  . Impingement syndrome of right shoulder 10/05/2019  . Cervical radiculopathy 09/27/2019  . Essential hypertension   . Pure hypercholesterolemia   . Chest pain 10/24/2017  . Morbid obesity ([place]) 10/24/2017  . Passive smoke exposure 10/24/2017  . Sinus congestion 10/24/2017    Bess Harvest, PTA 11/04/19 9:03 AM   [facility] [address]  [place], [place], [address] Phone: [phone]   Fax:  [phone]  Name: Amber Huffman MRN: [id number] Date of Birth: Apr 23, 1950

## 2019-11-04 NOTE — Patient Instructions (Signed)

## 2019-11-07 ENCOUNTER — Telehealth: Payer: Self-pay

## 2019-11-07 NOTE — Telephone Encounter (Signed)
  Follow up Call-  Call back number 11/03/2019  Post procedure Call Back phone  # [phone]  Permission to leave phone message Yes  Some recent data might be hidden     Patient questions:  Do you have a fever, pain , or abdominal swelling? No. Pain Score  0 *  Have you tolerated food without any problems? Yes.    Have you been able to return to your normal activities? Yes.    Do you have any questions about your discharge instructions: Diet   No. Medications  No. Follow up visit  No.  Do you have questions or concerns about your Care? No.  Actions: * If pain score is 4 or above: No action needed, pain <4. 1. .Have you developed a fever since your procedure? no  2.   Have you had an respiratory symptoms (SOB or cough) since your procedure? no  3.   Have you tested positive for COVID 19 since your procedure no  4.   Have you had any family members/close contacts diagnosed with the COVID 19 since your procedure?  no   If yes to any of these questions please route to Joylene John, RN and Joella Prince, RN

## 2019-11-08 ENCOUNTER — Other Ambulatory Visit: Payer: Self-pay

## 2019-11-08 ENCOUNTER — Ambulatory Visit: Payer: Medicare Other

## 2019-11-08 DIAGNOSIS — R29898 Other symptoms and signs involving the musculoskeletal system: Secondary | ICD-10-CM

## 2019-11-08 DIAGNOSIS — M545 Low back pain, unspecified: Secondary | ICD-10-CM

## 2019-11-08 DIAGNOSIS — M25511 Pain in right shoulder: Secondary | ICD-10-CM | POA: Diagnosis not present

## 2019-11-08 DIAGNOSIS — R293 Abnormal posture: Secondary | ICD-10-CM

## 2019-11-08 DIAGNOSIS — M5412 Radiculopathy, cervical region: Secondary | ICD-10-CM

## 2019-11-08 DIAGNOSIS — M6281 Muscle weakness (generalized): Secondary | ICD-10-CM

## 2019-11-08 DIAGNOSIS — G8929 Other chronic pain: Secondary | ICD-10-CM

## 2019-11-08 NOTE — Therapy (Signed)
[facility] [address]  [address] [place], [place], [address] Phone: [phone]   Fax:  [phone]  Physical Therapy Treatment  Patient Details  Name: Amber Huffman MRN: [id number] Date of Birth: 20-Jan-1950 Referring Provider (PT): Clare Gandy, MD   Encounter Date: 11/08/2019   PT End of Session - 11/08/19 0941    Visit Number 4    Number of Visits 16    Date for PT Re-Evaluation 12/20/19    Authorization Type Medicare & State BCBS    PT Start Time [phone]    PT Stop Time 1017    PT Time Calculation (min) 43 min    Activity Tolerance Patient tolerated treatment well    Behavior During Therapy [facility] for tasks assessed/performed           Past Medical History:  Diagnosis Date  . Allergy   . Anemia   . Anxiety   . Arthritis    Thumbs  . Blood transfusion without reported diagnosis   . Depression   . Diabetes mellitus without complication (HCC)    type 2  . GERD (gastroesophageal reflux disease)   . Hyperlipidemia   . Hypertension   . Morbid obesity with BMI of 50.0-59.9, adult (HCC)   . Osteoporosis   . Pneumonia   . Substance abuse (HCC)    ETOH abuse, quit drinking in 1985    Past Surgical History:  Procedure Laterality Date  . ABDOMINAL HYSTERECTOMY    . CERVIX REMOVAL    . CESAREAN SECTION    . COLONOSCOPY    . OOPHORECTOMY    . UPPER GASTROINTESTINAL ENDOSCOPY    . WISDOM TOOTH EXTRACTION      There were no vitals filed for this visit.   Subjective Assessment - 11/08/19 0942    Subjective Pt. doing ok with no new complaints.    Patient Stated Goals "to be able to walk well enough for her trip to [facility] on Dec 8"    Currently in Pain? No/denies    Pain Score 0-No pain    Pain Location Shoulder    Pain Orientation Right    Multiple Pain Sites Yes    Pain Score 1    Pain Location Back    Pain Orientation Lower    Pain Descriptors / Indicators Aching    Pain Type Acute pain    Pain  Onset More than a month ago    Pain Frequency Intermittent                             [facility] Adult PT Treatment/Exercise - 11/08/19 0001      Self-Care   Self-Care Other Self-Care Comments    Other Self-Care Comments  Pt. admitting to poor monitoring of blood glucose levels and notes she is not consistently taking her MD prescribed medications; pt. encouraged to reach out to MD regarding any questions regarding her medications however also encouraged to be mindful of adhering to MD guidance in regards to her BG monitoring as to avoid inbalance; verbally reviewed HEP; pt. admitting to poor compliance with HEP thus encouraged her to adhere for improved benefit from therapy.        Neck Exercises: Theraband   Shoulder Extension 10 reps    Shoulder Extension Limitations yellow TB    Rows 15 reps;Red    Rows Limitations good scap. retraction     Horizontal ABduction 10 reps;Red  Horizontal ABduction Limitations seated - cues for scap. retraction       Lumbar Exercises: Stretches   Passive Hamstring Stretch Right;Left;1 rep;30 seconds   Pt. reporting poor compliance at home    Passive Hamstring Stretch Limitations supine with strap     Lower Trunk Rotation Limitations 5" x 10 reps    Pt. reporting poor compliance at home      Lumbar Exercises: Aerobic   UBE (Upper Arm Bike) lvl 1.0, 3 min forwards/3 min backwards       Knee/Hip Exercises: Stretches   Gastroc Stretch Right;1 rep;30 seconds    Gastroc Stretch Limitations standing runners stretch       Shoulder Exercises: Seated   Retraction Both;10 reps;Strengthening    Retraction Limitations leaning on doorseal                   PT Education - 11/08/19 1127    Education Details HEP update; red TB row    Person(s) Educated Patient    Methods Explanation;Demonstration;Verbal cues;Handout    Comprehension Verbalized understanding;Returned demonstration;Verbal cues required            PT Short Term  Goals - 11/08/19 0959      PT SHORT TERM GOAL #1   Title Patient will be independent with initial HEP    Status Achieved    Target Date 11/15/19      PT SHORT TERM GOAL #2   Title Patient will verbalize/demonstrate understanding of neutral spine posture and proper body mechanics to reduce strain on cervical & lumbar spine    Status Achieved    Target Date 11/15/19             PT Long Term Goals - 11/01/19 0955      PT LONG TERM GOAL #1   Title Patient will be independent with ongoing/advanced HEP +/- gym program    Status On-going      PT LONG TERM GOAL #2   Title Patient will verbalize/demonstrate good awareness of neutral spine posture and proper body mechanics for daily tasks    Status On-going      PT LONG TERM GOAL #3   Title Patient will demonstrate improved B strength to >/= 4+/5 for improved postural stability and functional UE use    Status On-going      PT LONG TERM GOAL #4   Title Patient will demonstrate improved B LE strength to >/= 4 to 4+/5 for improved stability and ease of mobility    Status On-going      PT LONG TERM GOAL #5   Title Patient will ambulate with good posture and normal gait pattern with or w/o LRAD w/o evidence of L knee instability to allow for pt to go on trip in December    Status On-going                 Plan - 11/08/19 0943    Clinical Impression Statement Amber Huffman reporting her primary concern/limiting factor now while walking is her LBP thus focused session on lumbar/postural strengthening activities.  Amber Huffman reporting improved awareness of neutral spine and cervical posture since starting therapy thus STG #2 met.  Pt. admitting to poor adherence to HEP and encouraged her to improve adherence for full benefit from therapy.  Counseled pt. to contact MD regarding her blood glucose monitoring/medications if she has questions as pt. admitting to poor BG management and inconsistent medication adherence.  Pt. verbalizing upcoming f/u  with MD regarding BG  in coming weeks.    Comorbidities multiple active problems (LBP, cervical radiculopathy, R shoulder impingement, L knee pain/instability) + HTN, OA, morbid obesity, DM    Rehab Potential Good    PT Frequency 2x / week    PT Duration 8 weeks    PT Treatment/Interventions ADLs/Self Care Home Management;Cryotherapy;Electrical Stimulation;Iontophoresis 4mg /ml Dexamethasone;Moist Heat;Traction;Ultrasound;DME Instruction;Gait training;Stair training;Functional mobility training;Therapeutic activities;Therapeutic exercise;Balance training;Neuromuscular re-education;Patient/family education;Manual techniques;Passive range of motion;Dry needling;Taping;Vasopneumatic Device;Spinal Manipulations;Joint Manipulations    PT Next Visit Plan Progress postural and core/lumbopelvic strengthening; manual therapy and modalities PRN    PT Home Exercise Plan 10/12 - HS, ITB, LTR, open book, UT & LS stretches, pelvic tilt, chin tuck & scap retraction; 10/26 - red TB standing row    Consulted and Agree with Plan of Care Patient           Patient will benefit from skilled therapeutic intervention in order to improve the following deficits and impairments:  Abnormal gait, Decreased activity tolerance, Decreased balance, Decreased endurance, Decreased mobility, Decreased range of motion, Decreased strength, Difficulty walking, Increased fascial restricitons, Increased muscle spasms, Impaired perceived functional ability, Impaired flexibility, Impaired UE functional use, Improper body mechanics, Postural dysfunction, Pain  Visit Diagnosis: Acute pain of right shoulder  Radiculopathy, cervical region  Acute bilateral low back pain without sciatica  Chronic pain of left knee  Muscle weakness (generalized)  Other symptoms and signs involving the musculoskeletal system  Abnormal posture     Problem List Patient Active Problem List   Diagnosis Date Noted  . Primary osteoarthritis of left  knee 10/13/2019  . Acute bilateral low back pain without sciatica 10/13/2019  . Impingement syndrome of right shoulder 10/05/2019  . Cervical radiculopathy 09/27/2019  . Essential hypertension   . Pure hypercholesterolemia   . Chest pain 10/24/2017  . Morbid obesity ([place]) 10/24/2017  . Passive smoke exposure 10/24/2017  . Sinus congestion 10/24/2017    Bess Harvest, PTA 11/08/19 1:29 PM   [facility] [address]  [place], [place], [address] Phone: [phone]   Fax:  [phone]  Name: Amber Huffman MRN: [id number] Date of Birth: 10-04-1950

## 2019-11-11 ENCOUNTER — Other Ambulatory Visit: Payer: Self-pay

## 2019-11-11 ENCOUNTER — Encounter: Payer: Self-pay | Admitting: Physical Therapy

## 2019-11-11 ENCOUNTER — Encounter: Payer: Self-pay | Admitting: Family Medicine

## 2019-11-11 ENCOUNTER — Ambulatory Visit: Payer: Medicare Other | Admitting: Physical Therapy

## 2019-11-11 ENCOUNTER — Ambulatory Visit (INDEPENDENT_AMBULATORY_CARE_PROVIDER_SITE_OTHER): Payer: Medicare Other | Admitting: Family Medicine

## 2019-11-11 DIAGNOSIS — M25511 Pain in right shoulder: Secondary | ICD-10-CM

## 2019-11-11 DIAGNOSIS — M545 Low back pain, unspecified: Secondary | ICD-10-CM | POA: Diagnosis not present

## 2019-11-11 DIAGNOSIS — R29898 Other symptoms and signs involving the musculoskeletal system: Secondary | ICD-10-CM

## 2019-11-11 DIAGNOSIS — M25562 Pain in left knee: Secondary | ICD-10-CM

## 2019-11-11 DIAGNOSIS — M5412 Radiculopathy, cervical region: Secondary | ICD-10-CM

## 2019-11-11 DIAGNOSIS — G8929 Other chronic pain: Secondary | ICD-10-CM

## 2019-11-11 DIAGNOSIS — M6281 Muscle weakness (generalized): Secondary | ICD-10-CM

## 2019-11-11 DIAGNOSIS — R293 Abnormal posture: Secondary | ICD-10-CM

## 2019-11-11 NOTE — Therapy (Signed)
[facility] [address]  [place], [place], [address] Phone: [phone]   Fax:  [phone]  Physical Therapy Treatment / Progress Note  Patient Details  Name: Amber Huffman MRN: [id number] Date of Birth: [birth year] Referring Provider (PT): Clearance Coots, MD  Progress Note  Reporting Period 10/25/2019 to 11/11/2019  See note below for Objective Data and Assessment of Progress/Goals.      Encounter Date: 11/11/2019   PT End of Session - 11/11/19 0931    Visit Number 5    Number of Visits 16    Date for PT Re-Evaluation 12/20/19    Authorization Type Medicare & State BCBS    PT Start Time [phone]    PT Stop Time 1017    PT Time Calculation (min) 46 min    Activity Tolerance Patient tolerated treatment well    Behavior During Therapy WFL for tasks assessed/performed           Past Medical History:  Diagnosis Date  . Allergy   . Anemia   . Anxiety   . Arthritis    Thumbs  . Blood transfusion without reported diagnosis   . Depression   . Diabetes mellitus without complication ([place])    type 2  . GERD (gastroesophageal reflux disease)   . Hyperlipidemia   . Hypertension   . Morbid obesity with BMI of 50.0-59.9, adult ([place])   . Osteoporosis   . Pneumonia   . Substance abuse ([place])    ETOH abuse, quit drinking in 1985    Past Surgical History:  Procedure Laterality Date  . ABDOMINAL HYSTERECTOMY    . CERVIX REMOVAL    . CESAREAN SECTION    . COLONOSCOPY    . OOPHORECTOMY    . UPPER GASTROINTESTINAL ENDOSCOPY    . WISDOM TOOTH EXTRACTION      There were no vitals filed for this visit.   Subjective Assessment - 11/11/19 0934    Subjective Pt reports pain is much better - no pain toiday but states her back will "nag" her occasionally and her knee "hardly gives her any trouble" and her shoulder "is hopefully in the rear view mirror".    Patient Stated Goals "to be able to walk well enough for her  trip to [facility] on Dec 8"    Currently in Pain? No/denies    Pain Onset More than a month ago              [facility] PT Assessment - 11/11/19 0931      Assessment   Medical Diagnosis Acute LBP, cervical radiculopathy, R shoulder impingement, L knee pain/instability    Referring Provider (PT) Clearance Coots, MD    Onset Date/Surgical Date --   varies but grossly 2-3 months   Next MD Visit 11/11/19      AROM   Cervical Flexion 59    Cervical Extension 48    Cervical - Right Side Bend 25    Cervical - Left Side Bend 22    Cervical - Right Rotation 55    Cervical - Left Rotation 56    Lumbar Flexion fingertip to floor    Lumbar Extension 25% limited    Lumbar - Right Side Bend hand to mid calf    Lumbar - Left Side Bend hand to mid calf    Lumbar - Right Rotation 20% limited    Lumbar - Left Rotation WNL      Strength   Overall  Strength Comments tested in sitting    Right Shoulder Flexion 4/5    Right Shoulder ABduction 4+/5    Right Shoulder Internal Rotation 5/5    Right Shoulder External Rotation 4+/5    Left Shoulder Flexion 4/5    Left Shoulder ABduction 4+/5    Left Shoulder Internal Rotation 5/5    Left Shoulder External Rotation 4+/5    Right Hip Flexion 4/5    Right Hip Extension 4/5    Right Hip ABduction 4+/5    Right Hip ADduction 4/5    Left Hip Flexion 4/5    Left Hip Extension 4/5    Left Hip ABduction 4+/5    Left Hip ADduction 4-/5    Right Knee Flexion 5/5    Right Knee Extension 5/5    Left Knee Flexion 5/5    Left Knee Extension 5/5    Right Ankle Dorsiflexion 5/5    Left Ankle Dorsiflexion 5/5                         [facility] Adult PT Treatment/Exercise - 11/11/19 0931      Neck Exercises: Theraband   Horizontal ABduction 10 reps;Red    Horizontal ABduction Limitations seated on dynadisc with feet on AIrex pad + 4" step; ciues for abd bracing & scap retraction    Other Theraband Exercises Alt red TB UE diagonals x 10 - seated on  dynadisc with feet on AIrex pad + 4" step; ciues for abd bracing & scap retraction      Lumbar Exercises: Aerobic   Nustep L4 x 6 min      Lumbar Exercises: Seated   Long Arc Quad on [employer] Right;Left;10 reps    LAQ on [place] Limitations seated on dynadisc with feet on AIrex pad + 4" step    Hip Flexion on Ball Both;20 reps    Hip Flexion on Ball Limitations seated on dynadisc with feet on AIrex pad + 4" step      Knee/Hip Exercises: Seated   Ball Squeeze 10 x 5"                    PT Short Term Goals - 11/11/19 0937      PT SHORT TERM GOAL #1   Title Patient will be independent with initial HEP    Status Achieved   11/08/19     PT SHORT TERM GOAL #2   Title Patient will verbalize/demonstrate understanding of neutral spine posture and proper body mechanics to reduce strain on cervical & lumbar spine    Status Achieved   11/08/19            PT Long Term Goals - 11/11/19 0937      PT LONG TERM GOAL #1   Title Patient will be independent with ongoing/advanced HEP +/- gym program    Status Partially Met    Target Date 12/20/19      PT LONG TERM GOAL #2   Title Patient will verbalize/demonstrate good awareness of neutral spine posture and proper body mechanics for daily tasks    Status Achieved   11/11/19     PT LONG TERM GOAL #3   Title Patient will demonstrate improved B UE strength to >/= 4+/5 for improved postural stability and functional UE use    Status Partially Met   11/11/19 - met except flexion 4/5   Target Date 12/20/19      PT LONG TERM GOAL #  4   Title Patient will demonstrate improved B LE strength to >/= 4 to 4+/5 for improved stability and ease of mobility    Status Partially Met   11/11/19 - met except L hip adduction 4-/5   Target Date 12/20/19      PT LONG TERM GOAL #5   Title Patient will ambulate with good posture and normal gait pattern with or w/o LRAD w/o evidence of L knee instability to allow for pt to go on trip in December    Status  Partially Met   11/11/19 - pt reports no recent incidences of pain limiting mobility or feeling that knee might give way   Target Date 12/20/19                 Plan - 11/11/19 0938    Clinical Impression Statement Rocquel noting 75% improvement in overall pain and function since start of PT. She still notes occasional "nagging" back pain but no longer feels the sensation of a "string pulling her forward". Cervical and lumbar ROM are improving in all planes and B UE and LE strength now >/= 4/5. All STGs met and good progress demonstrated toward all LTGs with LTG #2 met and the remaining goals at least partially met. Kindell will continue to benefit from further skilled PT for continued core and proximal UE/LE strengthening to improve lumbar support and knee stability with gait.    Comorbidities multiple active problems (LBP, cervical radiculopathy, R shoulder impingement, L knee pain/instability) + HTN, OA, morbid obesity, DM    Rehab Potential Good    PT Frequency 2x / week    PT Duration 8 weeks    PT Treatment/Interventions ADLs/Self Care Home Management;Cryotherapy;Electrical Stimulation;Iontophoresis 33m/ml Dexamethasone;Moist Heat;Traction;Ultrasound;DME Instruction;Gait training;Stair training;Functional mobility training;Therapeutic activities;Therapeutic exercise;Balance training;Neuromuscular re-education;Patient/family education;Manual techniques;Passive range of motion;Dry needling;Taping;Vasopneumatic Device;Spinal Manipulations;Joint Manipulations    PT Next Visit Plan Progress postural and core/lumbopelvic strengthening; manual therapy and modalities PRN    PT Home Exercise Plan 10/12 - HS, ITB, LTR, open book, UT & LS stretches, pelvic tilt, chin tuck & scap retraction; 10/26 - red TB standing row    Consulted and Agree with Plan of Care Patient           Patient will benefit from skilled therapeutic intervention in order to improve the following deficits and impairments:   Abnormal gait, Decreased activity tolerance, Decreased balance, Decreased endurance, Decreased mobility, Decreased range of motion, Decreased strength, Difficulty walking, Increased fascial restricitons, Increased muscle spasms, Impaired perceived functional ability, Impaired flexibility, Impaired UE functional use, Improper body mechanics, Postural dysfunction, Pain  Visit Diagnosis: Acute pain of right shoulder  Radiculopathy, cervical region  Acute bilateral low back pain without sciatica  Chronic pain of left knee  Muscle weakness (generalized)  Other symptoms and signs involving the musculoskeletal system  Abnormal posture     Problem List Patient Active Problem List   Diagnosis Date Noted  . Primary osteoarthritis of left knee 10/13/2019  . Acute bilateral low back pain without sciatica 10/13/2019  . Impingement syndrome of right shoulder 10/05/2019  . Cervical radiculopathy 09/27/2019  . Essential hypertension   . Pure hypercholesterolemia   . Chest pain 10/24/2017  . Morbid obesity ([place] 10/24/2017  . Passive smoke exposure 10/24/2017  . Sinus congestion 10/24/2017    JPercival Spanish PT, MPT 11/11/2019, 10:17 AM  [facility] [place] [address]Phone: [phone]  Fax:  [phone]  Name: Cimberly Stoffel MRN: [id number] Date of Birth: [birth year]

## 2019-11-11 NOTE — Progress Notes (Signed)
Medication Samples have been provided to the patient.  Drug name: Pennsaid       Strength: 2%        Qty: 1 box  LOT: [id number]  Exp.Date: 03/2020  Dosing instructions: use a pea size amount and rub gently.  The patient has been instructed regarding the correct time, dose, and frequency of taking this medication, including desired effects and most common side effects.   Sherrie George, MA 10:59 AM 11/11/2019

## 2019-11-11 NOTE — Patient Instructions (Signed)
Good to see you Please try the pennsaid   Please send me a message in MyChart with any questions or updates.  Please see [place] back as needed.   --Dr. Raeford Razor

## 2019-11-11 NOTE — Assessment & Plan Note (Signed)
Has had resolution of her symptoms  -Can continue physical therapy if getting improvement. -Counseled on home exercise therapy and supportive care.

## 2019-11-11 NOTE — Progress Notes (Signed)
Amber Huffman - 69 y.o. female MRN [id number]  Date of birth: 1950/08/03  SUBJECTIVE:  Including CC & ROS.  No chief complaint on file.   Amber Huffman is a 69 y.o. female that is following up for her back and shoulder pain.  Her symptoms have improved with physical therapy.  She also got improvement with the injection.   Review of Systems See HPI   HISTORY: Past Medical, Surgical, Social, and Family History Reviewed & Updated per EMR.   Pertinent Historical Findings include:  Past Medical History:  Diagnosis Date  . Allergy   . Anemia   . Anxiety   . Arthritis    Thumbs  . Blood transfusion without reported diagnosis   . Depression   . Diabetes mellitus without complication ([place])    type 2  . GERD (gastroesophageal reflux disease)   . Hyperlipidemia   . Hypertension   . Morbid obesity with BMI of 50.0-59.9, adult ([place])   . Osteoporosis   . Pneumonia   . Substance abuse ([place])    ETOH abuse, quit drinking in 1985    Past Surgical History:  Procedure Laterality Date  . ABDOMINAL HYSTERECTOMY    . CERVIX REMOVAL    . CESAREAN SECTION    . COLONOSCOPY    . OOPHORECTOMY    . UPPER GASTROINTESTINAL ENDOSCOPY    . WISDOM TOOTH EXTRACTION      Family History  Problem Relation Age of Onset  . Heart disease Father        After the age of 47  . Uterine cancer Mother   . Colon cancer Neg Hx   . Esophageal cancer Neg Hx   . Rectal cancer Neg Hx   . Stomach cancer Neg Hx     Social History   Socioeconomic History  . Marital status: Married    Spouse name: Not on file  . Number of children: Not on file  . Years of education: Not on file  . Highest education level: Not on file  Occupational History  . Not on file  Tobacco Use  . Smoking status: Passive Smoke Exposure - Never Smoker  . Smokeless tobacco: Never Used  Vaping Use  . Vaping Use: Never used  Substance and Sexual Activity  . 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  . Drug use: Not Currently  . Sexual activity: Not on file    Comment: Hysterectomy  Other Topics Concern  . Not on file  Social History Narrative  . Not on file   Social Determinants of Health   Financial Resource Strain:   . Difficulty of Paying Living Expenses: Not on file  Food Insecurity:   . Worried About Charity fundraiser in the Last Year: Not on file  . Ran Out of Food in the Last Year: Not on file  Transportation Needs:   . Lack of Transportation (Medical): Not on file  . Lack of Transportation (Non-Medical): Not on file  Physical Activity:   . Days of Exercise per Week: Not on file  . Minutes of Exercise per Session: Not on file  Stress:   . Feeling of Stress : Not on file  Social Connections:   . Frequency of Communication with Friends and Family: Not on file  . Frequency of Social Gatherings with Friends and Family: Not on file  .  Attends Religious Services: Not on file  . Active Member of Clubs or Organizations: Not on file  . Attends Archivist Meetings: Not on file  . Marital Status: Not on file  Intimate Partner Violence:   . Fear of Current or Ex-Partner: Not on file  . Emotionally Abused: Not on file  . Physically Abused: Not on file  . Sexually Abused: Not on file     PHYSICAL EXAM:  VS: There were no vitals taken for this visit. Physical Exam Gen: NAD, alert, cooperative with exam, well-appearing    ASSESSMENT & PLAN:   Cervical radiculopathy Has had resolution of her symptoms  -Can continue physical therapy if getting improvement. -Counseled on home exercise therapy and supportive care.   Acute bilateral low back pain without sciatica Has gotten improvement with physical therapy and measures thus far. -Counseled on home exercise therapy and supportive care. -Can continue physical therapy.

## 2019-11-11 NOTE — Assessment & Plan Note (Signed)
Has gotten improvement with physical therapy and measures thus far. -Counseled on home exercise therapy and supportive care. -Can continue physical therapy.

## 2019-11-12 ENCOUNTER — Encounter: Payer: Self-pay | Admitting: Gastroenterology

## 2019-11-15 ENCOUNTER — Other Ambulatory Visit: Payer: Self-pay

## 2019-11-15 ENCOUNTER — Ambulatory Visit: Payer: Medicare Other | Attending: Family Medicine

## 2019-11-15 DIAGNOSIS — R29898 Other symptoms and signs involving the musculoskeletal system: Secondary | ICD-10-CM | POA: Diagnosis present

## 2019-11-15 DIAGNOSIS — M25562 Pain in left knee: Secondary | ICD-10-CM | POA: Insufficient documentation

## 2019-11-15 DIAGNOSIS — M5412 Radiculopathy, cervical region: Secondary | ICD-10-CM | POA: Diagnosis present

## 2019-11-15 DIAGNOSIS — R293 Abnormal posture: Secondary | ICD-10-CM | POA: Insufficient documentation

## 2019-11-15 DIAGNOSIS — G8929 Other chronic pain: Secondary | ICD-10-CM | POA: Diagnosis present

## 2019-11-15 DIAGNOSIS — M545 Low back pain, unspecified: Secondary | ICD-10-CM | POA: Diagnosis present

## 2019-11-15 DIAGNOSIS — M6281 Muscle weakness (generalized): Secondary | ICD-10-CM | POA: Insufficient documentation

## 2019-11-15 DIAGNOSIS — M25511 Pain in right shoulder: Secondary | ICD-10-CM | POA: Diagnosis present

## 2019-11-15 NOTE — Therapy (Signed)
[facility] [place] [address]  [place], [place], [address] Phone: [phone]   Fax:  [phone]  Physical Therapy Treatment  Patient Details  Name: Amber Huffman MRN: [id number] Date of Birth: 06-Dec-1950 Referring Provider (PT): Clearance Coots, MD   Encounter Date: 11/15/2019   PT End of Session - 11/15/19 0942    Visit Number 6    Number of Visits 16    Date for PT Re-Evaluation 12/20/19    Authorization Type Medicare & State BCBS    PT Start Time [phone]   Pt. arrived late to session   PT Stop Time 1010    PT Time Calculation (min) 31 min    Activity Tolerance Patient tolerated treatment well    Behavior During Therapy [facility] for tasks assessed/performed           Past Medical History:  Diagnosis Date  . Allergy   . Anemia   . Anxiety   . Arthritis    Thumbs  . Blood transfusion without reported diagnosis   . Depression   . Diabetes mellitus without complication ([place])    type 2  . GERD (gastroesophageal reflux disease)   . Hyperlipidemia   . Hypertension   . Morbid obesity with BMI of 50.0-59.9, adult ([place])   . Osteoporosis   . Pneumonia   . Substance abuse ([place])    ETOH abuse, quit drinking in 1985    Past Surgical History:  Procedure Laterality Date  . ABDOMINAL HYSTERECTOMY    . CERVIX REMOVAL    . CESAREAN SECTION    . COLONOSCOPY    . OOPHORECTOMY    . UPPER GASTROINTESTINAL ENDOSCOPY    . WISDOM TOOTH EXTRACTION      There were no vitals filed for this visit.   Subjective Assessment - 11/15/19 0941    Subjective Pt. reporting increased LBP after going to the thrift shop for hours yesterday.    Patient Stated Goals "to be able to walk well enough for her trip to [facility] on Dec 8"    Currently in Pain? Yes    Pain Score 0-No pain    Pain Location Shoulder    Pain Orientation Right    Multiple Pain Sites Yes    Pain Score 1   up to a 6/10 yesterday   Pain Location Back    Pain  Orientation Lower    Pain Descriptors / Indicators Aching    Pain Type Acute pain                             [facility] Adult PT Treatment/Exercise - 11/15/19 0001      Neck Exercises: Theraband   Horizontal ABduction 10 reps;Red      Lumbar Exercises: Aerobic   Nustep L4 x 6 min      Lumbar Exercises: Supine   Bridge with Ball Squeeze 10 reps;3 seconds    Bridge with Ball Squeeze Limitations half ROM adduction ball squeeze       Shoulder Exercises: Seated   External Rotation 10 reps;Strengthening;Theraband    Theraband Level (Shoulder External Rotation) Level 1 (Yellow)    External Rotation Limitations seated + cues for scapular retraction    Flexion Both;10 reps    Flexion Limitations 0-150dg     Abduction Both;10 reps    ABduction Limitations scaption  PT Short Term Goals - 11/11/19 0937      PT SHORT TERM GOAL #1   Title Patient will be independent with initial HEP    Status Achieved   11/08/19     PT SHORT TERM GOAL #2   Title Patient will verbalize/demonstrate understanding of neutral spine posture and proper body mechanics to reduce strain on cervical & lumbar spine    Status Achieved   11/08/19            PT Long Term Goals - 11/11/19 0937      PT LONG TERM GOAL #1   Title Patient will be independent with ongoing/advanced HEP +/- gym program    Status Partially Met    Target Date 12/20/19      PT LONG TERM GOAL #2   Title Patient will verbalize/demonstrate good awareness of neutral spine posture and proper body mechanics for daily tasks    Status Achieved   11/11/19     PT LONG TERM GOAL #3   Title Patient will demonstrate improved B UE strength to >/= 4+/5 for improved postural stability and functional UE use    Status Partially Met   11/11/19 - met except flexion 4/5   Target Date 12/20/19      PT LONG TERM GOAL #4   Title Patient will demonstrate improved B LE strength to >/= 4 to 4+/5 for improved  stability and ease of mobility    Status Partially Met   11/11/19 - met except L hip adduction 4-/5   Target Date 12/20/19      PT LONG TERM GOAL #5   Title Patient will ambulate with good posture and normal gait pattern with or w/o LRAD w/o evidence of L knee instability to allow for pt to go on trip in December    Status Partially Met   11/11/19 - pt reports no recent incidences of pain limiting mobility or feeling that knee might give way   Target Date 12/20/19                 Plan - 11/15/19 0952    Clinical Impression Statement Amber Huffman noting increased LBP yesterday after walking at thrift store for a long time.  Progressed shoulder flexion, scaption elevation strengthening activities along with hip adduction strengthening to work toward deficits revealed from yesterday's UE/LE MMT.  Pt. tolerated all therex today well and ended session pain free.    Comorbidities multiple active problems (LBP, cervical radiculopathy, R shoulder impingement, L knee pain/instability) + HTN, OA, morbid obesity, DM    Rehab Potential Good    PT Frequency 2x / week    PT Duration 8 weeks    PT Treatment/Interventions ADLs/Self Care Home Management;Cryotherapy;Electrical Stimulation;Iontophoresis 52m/ml Dexamethasone;Moist Heat;Traction;Ultrasound;DME Instruction;Gait training;Stair training;Functional mobility training;Therapeutic activities;Therapeutic exercise;Balance training;Neuromuscular re-education;Patient/family education;Manual techniques;Passive range of motion;Dry needling;Taping;Vasopneumatic Device;Spinal Manipulations;Joint Manipulations    PT Next Visit Plan Progress postural and core/lumbopelvic strengthening; manual therapy and modalities PRN    PT Home Exercise Plan 10/12 - HS, ITB, LTR, open book, UT & LS stretches, pelvic tilt, chin tuck & scap retraction; 10/26 - red TB standing row    Consulted and Agree with Plan of Care Patient           Patient will benefit from skilled  therapeutic intervention in order to improve the following deficits and impairments:  Abnormal gait, Decreased activity tolerance, Decreased balance, Decreased endurance, Decreased mobility, Decreased range of motion, Decreased strength, Difficulty walking, Increased fascial restricitons, Increased  muscle spasms, Impaired perceived functional ability, Impaired flexibility, Impaired UE functional use, Improper body mechanics, Postural dysfunction, Pain  Visit Diagnosis: Acute pain of right shoulder  Radiculopathy, cervical region  Acute bilateral low back pain without sciatica  Chronic pain of left knee  Muscle weakness (generalized)  Other symptoms and signs involving the musculoskeletal system  Abnormal posture     Problem List Patient Active Problem List   Diagnosis Date Noted  . Primary osteoarthritis of left knee 10/13/2019  . Acute bilateral low back pain without sciatica 10/13/2019  . Impingement syndrome of right shoulder 10/05/2019  . Cervical radiculopathy 09/27/2019  . Essential hypertension   . Pure hypercholesterolemia   . Chest pain 10/24/2017  . Morbid obesity ([place]) 10/24/2017  . Passive smoke exposure 10/24/2017  . Sinus congestion 10/24/2017    Bess Harvest, PTA 11/15/19 1:14 PM   [place] [place] [address]  [place], [place], [address] Phone: [phone]   Fax:  [phone]  Name: Amber Huffman MRN: [id number] Date of Birth: 1950/09/28

## 2019-11-18 ENCOUNTER — Other Ambulatory Visit: Payer: Self-pay

## 2019-11-18 ENCOUNTER — Encounter: Payer: Self-pay | Admitting: Physical Therapy

## 2019-11-18 ENCOUNTER — Ambulatory Visit: Payer: Medicare Other | Admitting: Physical Therapy

## 2019-11-18 DIAGNOSIS — M545 Low back pain, unspecified: Secondary | ICD-10-CM

## 2019-11-18 DIAGNOSIS — R29898 Other symptoms and signs involving the musculoskeletal system: Secondary | ICD-10-CM

## 2019-11-18 DIAGNOSIS — M25511 Pain in right shoulder: Secondary | ICD-10-CM | POA: Diagnosis not present

## 2019-11-18 DIAGNOSIS — M6281 Muscle weakness (generalized): Secondary | ICD-10-CM

## 2019-11-18 DIAGNOSIS — M25562 Pain in left knee: Secondary | ICD-10-CM

## 2019-11-18 DIAGNOSIS — G8929 Other chronic pain: Secondary | ICD-10-CM

## 2019-11-18 DIAGNOSIS — R293 Abnormal posture: Secondary | ICD-10-CM

## 2019-11-18 DIAGNOSIS — M5412 Radiculopathy, cervical region: Secondary | ICD-10-CM

## 2019-11-18 NOTE — Patient Instructions (Signed)
    Home exercise program created by Phillip Maffei, PT.  For questions, please contact Mousa Prout via phone at [phone] or email at [email or web address]  [facility] [place] [address]  [address] [place], [place], [address] Phone: [phone]   Fax:  [phone]    

## 2019-11-18 NOTE — Therapy (Signed)
[facility] [place] [address]  [place], [place], [address] Phone: [phone]   Fax:  [phone]  Physical Therapy Treatment  Patient Details  Name: Amber Huffman MRN: [id number] Date of Birth: 1950-12-24 Referring Provider (PT): Clearance Coots, MD   Encounter Date: 11/18/2019   PT End of Session - 11/18/19 0935    Visit Number 7    Number of Visits 16    Date for PT Re-Evaluation 12/20/19    Authorization Type Medicare & State BCBS    Progress Note Due on Visit 15   MD PN on visit #5 - 11/11/19   PT Start Time 0935    PT Stop Time 1019    PT Time Calculation (min) 44 min    Activity Tolerance Patient tolerated treatment well    Behavior During Therapy [facility] for tasks assessed/performed           Past Medical History:  Diagnosis Date  . Allergy   . Anemia   . Anxiety   . Arthritis    Thumbs  . Blood transfusion without reported diagnosis   . Depression   . Diabetes mellitus without complication ([place])    type 2  . GERD (gastroesophageal reflux disease)   . Hyperlipidemia   . Hypertension   . Morbid obesity with BMI of 50.0-59.9, adult ([place])   . Osteoporosis   . Pneumonia   . Substance abuse ([place])    ETOH abuse, quit drinking in 1985    Past Surgical History:  Procedure Laterality Date  . ABDOMINAL HYSTERECTOMY    . CERVIX REMOVAL    . CESAREAN SECTION    . COLONOSCOPY    . OOPHORECTOMY    . UPPER GASTROINTESTINAL ENDOSCOPY    . WISDOM TOOTH EXTRACTION      There were no vitals filed for this visit.   Subjective Assessment - 11/18/19 0938    Subjective Pt denies pain currently but did note a twinge in her shoulder earlier this morning. States she has been released by MD with 30-day hold.    Patient Stated Goals "to be able to walk well enough for her trip to [facility] on Dec 8"    Currently in Pain? No/denies                             [facility] Adult PT Treatment/Exercise -  11/18/19 0935      Lumbar Exercises: Aerobic   Nustep L4 x 6 min (UE/LE)      Lumbar Exercises: Standing   Functional Squats 10 reps;3 seconds    Functional Squats Limitations supported squat at [employer] Both;10 reps;Strengthening;Theraband    Theraband Level (Row) Level 2 (Red)    Row Limitations staggered stance + cues for scap retraction & abd bracing     Shoulder Extension Both;10 reps;Strengthening;Theraband    Shoulder Extension Limitations staggered stance + cues for scap retraction & abd bracing     Other Standing Lumbar Exercises R/L red TB pallof press x 10      Lumbar Exercises: Seated   Hip Flexion on Ball Both;20 reps    Hip Flexion on Ball Limitations on chair/mat table      Knee/Hip Exercises: Seated   Ball Squeeze 10 x 5" + cues for abd bracing    Clamshell with TheraBand Red   10 x 5" + abd bracing  PT Education - 11/18/19 1019    Education Details HEP update - core activation + red TB standing rows/retraction & pallof press, supported squat, seated hip ADD isometric, marching & red TB clam    Person(s) Educated Patient    Methods Explanation;Demonstration;Verbal cues;Tactile cues;Handout    Comprehension Verbalized understanding;Verbal cues required;Tactile cues required;Returned demonstration;Need further instruction            PT Short Term Goals - 11/11/19 0937      PT SHORT TERM GOAL #1   Title Patient will be independent with initial HEP    Status Achieved   11/08/19     PT SHORT TERM GOAL #2   Title Patient will verbalize/demonstrate understanding of neutral spine posture and proper body mechanics to reduce strain on cervical & lumbar spine    Status Achieved   11/08/19            PT Long Term Goals - 11/11/19 0937      PT LONG TERM GOAL #1   Title Patient will be independent with ongoing/advanced HEP +/- gym program    Status Partially Met    Target Date 12/20/19      PT LONG TERM GOAL #2   Title Patient  will verbalize/demonstrate good awareness of neutral spine posture and proper body mechanics for daily tasks    Status Achieved   11/11/19     PT LONG TERM GOAL #3   Title Patient will demonstrate improved B UE strength to >/= 4+/5 for improved postural stability and functional UE use    Status Partially Met   11/11/19 - met except flexion 4/5   Target Date 12/20/19      PT LONG TERM GOAL #4   Title Patient will demonstrate improved B LE strength to >/= 4 to 4+/5 for improved stability and ease of mobility    Status Partially Met   11/11/19 - met except L hip adduction 4-/5   Target Date 12/20/19      PT LONG TERM GOAL #5   Title Patient will ambulate with good posture and normal gait pattern with or w/o LRAD w/o evidence of L knee instability to allow for pt to go on trip in December    Status Partially Met   11/11/19 - pt reports no recent incidences of pain limiting mobility or feeling that knee might give way   Target Date 12/20/19                 Plan - 11/18/19 0942    Clinical Impression Statement Amber Huffman reports mild twinge in her shoulder earlier this morning, but states shoulder pain has not been much of an issue of late, however LBP almost daily on intermittent basis typically after having been on her feet more. Progress therapeutic exercise targeting core activation with seated and standing exercise to improve activity tolerance with decreased limitation due to LBP with HEP updated accordingly. Amber Huffman progressing well toward goals and will like be able to transition to HEP within next 2 weeks.    Comorbidities multiple active problems (LBP, cervical radiculopathy, R shoulder impingement, L knee pain/instability) + HTN, OA, morbid obesity, DM    Rehab Potential Good    PT Frequency 2x / week    PT Duration 8 weeks    PT Treatment/Interventions ADLs/Self Care Home Management;Cryotherapy;Electrical Stimulation;Iontophoresis 4mg /ml Dexamethasone;Moist  Heat;Traction;Ultrasound;DME Instruction;Gait training;Stair training;Functional mobility training;Therapeutic activities;Therapeutic exercise;Balance training;Neuromuscular re-education;Patient/family education;Manual techniques;Passive range of motion;Dry needling;Taping;Vasopneumatic Device;Spinal Manipulations;Joint Manipulations    PT Next  Visit Plan Progress postural and core/lumbopelvic strengthening; manual therapy and modalities PRN    PT Home Exercise Plan 10/12 - HS, ITB, LTR, open book, UT & LS stretches, pelvic tilt, chin tuck & scap retraction; 10/26 - red TB standing row; 11/5 - core activation + red TB standing rows/retraction & pallof press, supported squat, seated hip ADD isometric, marching & red TB clam    Consulted and Agree with Plan of Care Patient           Patient will benefit from skilled therapeutic intervention in order to improve the following deficits and impairments:  Abnormal gait, Decreased activity tolerance, Decreased balance, Decreased endurance, Decreased mobility, Decreased range of motion, Decreased strength, Difficulty walking, Increased fascial restricitons, Increased muscle spasms, Impaired perceived functional ability, Impaired flexibility, Impaired UE functional use, Improper body mechanics, Postural dysfunction, Pain  Visit Diagnosis: Acute pain of right shoulder  Radiculopathy, cervical region  Acute bilateral low back pain without sciatica  Chronic pain of left knee  Muscle weakness (generalized)  Other symptoms and signs involving the musculoskeletal system  Abnormal posture     Problem List Patient Active Problem List   Diagnosis Date Noted  . Primary osteoarthritis of left knee 10/13/2019  . Acute bilateral low back pain without sciatica 10/13/2019  . Impingement syndrome of right shoulder 10/05/2019  . Cervical radiculopathy 09/27/2019  . Essential hypertension   . Pure hypercholesterolemia   . Chest pain 10/24/2017  . Morbid  obesity ([place]) 10/24/2017  . Passive smoke exposure 10/24/2017  . Sinus congestion 10/24/2017    Percival Spanish, PT, MPT 11/18/2019, 12:30 PM  [facility] [address]  [place], [place], [address] Phone: [phone]   Fax:  [phone]  Name: Amber Huffman MRN: [id number] Date of Birth: 1950-03-21

## 2019-11-22 ENCOUNTER — Ambulatory Visit: Payer: Medicare Other | Admitting: Physical Therapy

## 2019-11-22 ENCOUNTER — Encounter: Payer: Self-pay | Admitting: Physical Therapy

## 2019-11-22 ENCOUNTER — Other Ambulatory Visit: Payer: Self-pay

## 2019-11-22 DIAGNOSIS — M545 Low back pain, unspecified: Secondary | ICD-10-CM

## 2019-11-22 DIAGNOSIS — G8929 Other chronic pain: Secondary | ICD-10-CM

## 2019-11-22 DIAGNOSIS — R29898 Other symptoms and signs involving the musculoskeletal system: Secondary | ICD-10-CM

## 2019-11-22 DIAGNOSIS — M25511 Pain in right shoulder: Secondary | ICD-10-CM

## 2019-11-22 DIAGNOSIS — M25562 Pain in left knee: Secondary | ICD-10-CM

## 2019-11-22 DIAGNOSIS — R293 Abnormal posture: Secondary | ICD-10-CM

## 2019-11-22 DIAGNOSIS — M5412 Radiculopathy, cervical region: Secondary | ICD-10-CM

## 2019-11-22 DIAGNOSIS — M6281 Muscle weakness (generalized): Secondary | ICD-10-CM

## 2019-11-22 NOTE — Therapy (Signed)
[facility] [place] [address]  [place], [place], [address] Phone: [phone]   Fax:  [phone]  Physical Therapy Treatment  Patient Details  Name: Amber Huffman MRN: [id number] Date of Birth: 01-Jan-1951 Referring Provider (PT): Clearance Coots, MD   Encounter Date: 11/22/2019   PT End of Session - 11/22/19 0934    Visit Number 8    Number of Visits 16    Date for PT Re-Evaluation 12/20/19    Authorization Type Medicare & State BCBS    Progress Note Due on Visit 15   MD PN on visit #5 - 11/11/19   PT Start Time 0934    PT Stop Time 1017    PT Time Calculation (min) 43 min    Activity Tolerance Patient tolerated treatment well    Behavior During Therapy [facility] for tasks assessed/performed           Past Medical History:  Diagnosis Date  . Allergy   . Anemia   . Anxiety   . Arthritis    Thumbs  . Blood transfusion without reported diagnosis   . Depression   . Diabetes mellitus without complication ([place])    type 2  . GERD (gastroesophageal reflux disease)   . Hyperlipidemia   . Hypertension   . Morbid obesity with BMI of 50.0-59.9, adult ([place])   . Osteoporosis   . Pneumonia   . Substance abuse ([place])    ETOH abuse, quit drinking in 1985    Past Surgical History:  Procedure Laterality Date  . ABDOMINAL HYSTERECTOMY    . CERVIX REMOVAL    . CESAREAN SECTION    . COLONOSCOPY    . OOPHORECTOMY    . UPPER GASTROINTESTINAL ENDOSCOPY    . WISDOM TOOTH EXTRACTION      There were no vitals filed for this visit.   Subjective Assessment - 11/22/19 0941    Subjective Pt reports increased muscle soreness in her L side following last visit which is now resolved. Has not attempted latest HEP update since last visit secondary to too busy.    Patient Stated Goals "to be able to walk well enough for her trip to [facility] on Dec 8"    Currently in Pain? No/denies                             [facility]  Adult PT Treatment/Exercise - 11/22/19 0934      Neck Exercises: Theraband   Shoulder Extension 10 reps;Red    Shoulder Extension Limitations seated on dynadisc with cues for abd bracing    Rows 15 reps;Red    Rows Limitations seated on dynadisc with cues for abd bracing      Lumbar Exercises: Aerobic   Nustep L4 x 6 min (UE/LE)      Lumbar Exercises: Standing   Functional Squats 10 reps;3 seconds    Functional Squats Limitations supported squat at [employer] Both;10 reps;Strengthening;Theraband    Theraband Level (Row) Level 2 (Red)    Row Limitations staggered stance + cues for scap retraction & abd bracing     Shoulder Extension Both;10 reps;Strengthening;Theraband    Shoulder Extension Limitations staggered stance + cues for scap retraction & abd bracing     Other Standing Lumbar Exercises R/L red TB pallof press x 10      Lumbar Exercises: Seated   Hip Flexion on Ball Both;20 reps  Hip Flexion on Ball Limitations + opp UE flexion; seated on dynadisc with feet on AIrex pad + 4" step    Other Seated Lumbar Exercises R/L red TB pallof press x 10; seated on dynadisc with cues for abd bracing      Knee/Hip Exercises: Seated   Ball Squeeze 10 x 5" + cues for abd bracing; seated on dynadisc    Clamshell with TheraBand Red   10 x 5" + abd bracing; seated on dynadisc                   PT Short Term Goals - 11/11/19 0937      PT SHORT TERM GOAL #1   Title Patient will be independent with initial HEP    Status Achieved   11/08/19     PT SHORT TERM GOAL #2   Title Patient will verbalize/demonstrate understanding of neutral spine posture and proper body mechanics to reduce strain on cervical & lumbar spine    Status Achieved   11/08/19            PT Long Term Goals - 11/11/19 0937      PT LONG TERM GOAL #1   Title Patient will be independent with ongoing/advanced HEP +/- gym program    Status Partially Met    Target Date 12/20/19      PT LONG TERM GOAL #2    Title Patient will verbalize/demonstrate good awareness of neutral spine posture and proper body mechanics for daily tasks    Status Achieved   11/11/19     PT LONG TERM GOAL #3   Title Patient will demonstrate improved B UE strength to >/= 4+/5 for improved postural stability and functional UE use    Status Partially Met   11/11/19 - met except flexion 4/5   Target Date 12/20/19      PT LONG TERM GOAL #4   Title Patient will demonstrate improved B LE strength to >/= 4 to 4+/5 for improved stability and ease of mobility    Status Partially Met   11/11/19 - met except L hip adduction 4-/5   Target Date 12/20/19      PT LONG TERM GOAL #5   Title Patient will ambulate with good posture and normal gait pattern with or w/o LRAD w/o evidence of L knee instability to allow for pt to go on trip in December    Status Partially Met   11/11/19 - pt reports no recent incidences of pain limiting mobility or feeling that knee might give way   Target Date 12/20/19                 Plan - 11/22/19 0942    Clinical Impression Statement Aliviana noted increased L side/flank soreness following last session but unsure of triggering exercise(s) - soreness now resolved. She admits to not having attempted latest HEP update, therefore reviewed exercises to ensure proper technique and good tolerance with pt identifying pallof press as likely trigger for soreness from last visit but still able to complete all exercises w/o issue today.    Comorbidities multiple active problems (LBP, cervical radiculopathy, R shoulder impingement, L knee pain/instability) + HTN, OA, morbid obesity, DM    Rehab Potential Good    PT Frequency 2x / week    PT Duration 8 weeks    PT Treatment/Interventions ADLs/Self Care Home Management;Cryotherapy;Electrical Stimulation;Iontophoresis 4mg /ml Dexamethasone;Moist Heat;Traction;Ultrasound;DME Instruction;Gait training;Stair training;Functional mobility training;Therapeutic  activities;Therapeutic exercise;Balance training;Neuromuscular re-education;Patient/family education;Manual techniques;Passive range of motion;Dry  needling;Taping;Vasopneumatic Device;Spinal Manipulations;Joint Manipulations    PT Next Visit Plan Progress postural and core/lumbopelvic strengthening; manual therapy and modalities PRN    PT Home Exercise Plan 10/12 - HS, ITB, LTR, open book, UT & LS stretches, pelvic tilt, chin tuck & scap retraction; 10/26 - red TB standing row; 11/5 - core activation + red TB standing rows/retraction & pallof press, supported squat, seated hip ADD isometric, marching & red TB clam    Consulted and Agree with Plan of Care Patient           Patient will benefit from skilled therapeutic intervention in order to improve the following deficits and impairments:  Abnormal gait, Decreased activity tolerance, Decreased balance, Decreased endurance, Decreased mobility, Decreased range of motion, Decreased strength, Difficulty walking, Increased fascial restricitons, Increased muscle spasms, Impaired perceived functional ability, Impaired flexibility, Impaired UE functional use, Improper body mechanics, Postural dysfunction, Pain  Visit Diagnosis: Acute pain of right shoulder  Radiculopathy, cervical region  Acute bilateral low back pain without sciatica  Chronic pain of left knee  Muscle weakness (generalized)  Other symptoms and signs involving the musculoskeletal system  Abnormal posture     Problem List Patient Active Problem List   Diagnosis Date Noted  . Primary osteoarthritis of left knee 10/13/2019  . Acute bilateral low back pain without sciatica 10/13/2019  . Impingement syndrome of right shoulder 10/05/2019  . Cervical radiculopathy 09/27/2019  . Essential hypertension   . Pure hypercholesterolemia   . Chest pain 10/24/2017  . Morbid obesity ([place]) 10/24/2017  . Passive smoke exposure 10/24/2017  . Sinus congestion 10/24/2017    Percival Spanish, PT, MPT 11/22/2019, 10:18 AM  [facility] [address]  [place], [place], [address] Phone: [phone]   Fax:  [phone]  Name: Saragrace Selke MRN: [id number] Date of Birth: 1950-07-17

## 2019-11-25 ENCOUNTER — Ambulatory Visit: Payer: Medicare Other

## 2019-11-25 ENCOUNTER — Other Ambulatory Visit: Payer: Self-pay

## 2019-11-25 DIAGNOSIS — M25511 Pain in right shoulder: Secondary | ICD-10-CM | POA: Diagnosis not present

## 2019-11-25 DIAGNOSIS — G8929 Other chronic pain: Secondary | ICD-10-CM

## 2019-11-25 DIAGNOSIS — R29898 Other symptoms and signs involving the musculoskeletal system: Secondary | ICD-10-CM

## 2019-11-25 DIAGNOSIS — M5412 Radiculopathy, cervical region: Secondary | ICD-10-CM

## 2019-11-25 DIAGNOSIS — M25562 Pain in left knee: Secondary | ICD-10-CM

## 2019-11-25 DIAGNOSIS — M545 Low back pain, unspecified: Secondary | ICD-10-CM

## 2019-11-25 DIAGNOSIS — M6281 Muscle weakness (generalized): Secondary | ICD-10-CM

## 2019-11-25 DIAGNOSIS — R293 Abnormal posture: Secondary | ICD-10-CM

## 2019-11-25 NOTE — Therapy (Addendum)
[facility] [address]  [place], [place], [address] Phone: [phone]   Fax:  [phone]  Physical Therapy Treatment  Patient Details  Name: Amber Huffman MRN: [id number] Date of Birth: 06/16/1950 Referring Provider (PT): Clearance Coots, MD   Encounter Date: 11/25/2019   PT End of Session - 11/25/19 0901    Visit Number 9    Number of Visits 16    Date for PT Re-Evaluation 12/20/19    Authorization Type Medicare & State BCBS    Progress Note Due on Visit 15   MD PN on visit #5 - 11/11/19   PT Start Time 0853    PT Stop Time 0931    PT Time Calculation (min) 38 min    Activity Tolerance Patient tolerated treatment well    Behavior During Therapy [facility] for tasks assessed/performed           Past Medical History:  Diagnosis Date  . Allergy   . Anemia   . Anxiety   . Arthritis    Thumbs  . Blood transfusion without reported diagnosis   . Depression   . Diabetes mellitus without complication ([place])    type 2  . GERD (gastroesophageal reflux disease)   . Hyperlipidemia   . Hypertension   . Morbid obesity with BMI of 50.0-59.9, adult ([place])   . Osteoporosis   . Pneumonia   . Substance abuse ([place])    ETOH abuse, quit drinking in 1985    Past Surgical History:  Procedure Laterality Date  . ABDOMINAL HYSTERECTOMY    . CERVIX REMOVAL    . CESAREAN SECTION    . COLONOSCOPY    . OOPHORECTOMY    . UPPER GASTROINTESTINAL ENDOSCOPY    . WISDOM TOOTH EXTRACTION      There were no vitals filed for this visit.   Subjective Assessment - 11/25/19 0900    Subjective pt. doing ok.    Patient Stated Goals "to be able to walk well enough for her trip to [facility] on Dec 8"    Currently in Pain? No/denies    Pain Score 0-No pain    Pain Location Shoulder    Multiple Pain Sites No                             [facility] Adult PT Treatment/Exercise - 11/25/19 0001      Neck Exercises: Theraband     Other Theraband Exercises B shoulder flexion with yellow TB raise 20-140 dg       Lumbar Exercises: Aerobic   Nustep L4 x 6 min (UE/LE)      Knee/Hip Exercises: Stretches   Gastroc Stretch Right;Left;1 rep;30 seconds    Gastroc Stretch Limitations leaning into counter       Knee/Hip Exercises: Standing   Heel Raises Both;15 reps    Heel Raises Limitations at counter     Hip Abduction Right;Left;5 reps;Knee straight    Abduction Limitations yellow looped TB at ankles     Hip Extension Right;Left;5 reps;Knee straight    Extension Limitations yellow looped at ankles       Knee/Hip Exercises: Seated   Sit to Sand 10 reps;without UE support      Shoulder Exercises: Seated   Flexion Both;10 reps    Flexion Weight (lbs) 2    Flexion Limitations cues for pacing  PT Short Term Goals - 11/11/19 0937      PT SHORT TERM GOAL #1   Title Patient will be independent with initial HEP    Status Achieved   11/08/19     PT SHORT TERM GOAL #2   Title Patient will verbalize/demonstrate understanding of neutral spine posture and proper body mechanics to reduce strain on cervical & lumbar spine    Status Achieved   11/08/19            PT Long Term Goals - 11/11/19 0937      PT LONG TERM GOAL #1   Title Patient will be independent with ongoing/advanced HEP +/- gym program    Status Partially Met    Target Date 12/20/19      PT LONG TERM GOAL #2   Title Patient will verbalize/demonstrate good awareness of neutral spine posture and proper body mechanics for daily tasks    Status Achieved   11/11/19     PT LONG TERM GOAL #3   Title Patient will demonstrate improved B UE strength to >/= 4+/5 for improved postural stability and functional UE use    Status Partially Met   11/11/19 - met except flexion 4/5   Target Date 12/20/19      PT LONG TERM GOAL #4   Title Patient will demonstrate improved B LE strength to >/= 4 to 4+/5 for improved stability and ease  of mobility    Status Partially Met   11/11/19 - met except L hip adduction 4-/5   Target Date 12/20/19      PT LONG TERM GOAL #5   Title Patient will ambulate with good posture and normal gait pattern with or w/o LRAD w/o evidence of L knee instability to allow for pt to go on trip in December    Status Partially Met   11/11/19 - pt reports no recent incidences of pain limiting mobility or feeling that knee might give way   Target Date 12/20/19                 Plan - 11/25/19 0901    Clinical Impression Statement Amber Huffman without new complaints today.  Wishes to focus session on her back as she feels this is her problem area.  Progressed lumbopelvic strengthening activities without pain however did require encouragement from therapist for effort.  Able to progress shoulder flexion strengthening as well to target strength deficits.  Amber Huffman feels she is improving with therapy.    Comorbidities multiple active problems (LBP, cervical radiculopathy, R shoulder impingement, L knee pain/instability) + HTN, OA, morbid obesity, DM    Rehab Potential Good    PT Frequency 2x / week    PT Duration 8 weeks    PT Treatment/Interventions ADLs/Self Care Home Management;Cryotherapy;Electrical Stimulation;Iontophoresis 4mg /ml Dexamethasone;Moist Heat;Traction;Ultrasound;DME Instruction;Gait training;Stair training;Functional mobility training;Therapeutic activities;Therapeutic exercise;Balance training;Neuromuscular re-education;Patient/family education;Manual techniques;Passive range of motion;Dry needling;Taping;Vasopneumatic Device;Spinal Manipulations;Joint Manipulations    PT Next Visit Plan Progress postural and core/lumbopelvic strengthening; manual therapy and modalities PRN    PT Home Exercise Plan 10/12 - HS, ITB, LTR, open book, UT & LS stretches, pelvic tilt, chin tuck & scap retraction; 10/26 - red TB standing row; 11/5 - core activation + red TB standing rows/retraction & pallof press,  supported squat, seated hip ADD isometric, marching & red TB clam    Consulted and Agree with Plan of Care Patient           Patient will benefit from skilled therapeutic intervention in  order to improve the following deficits and impairments:  Abnormal gait, Decreased activity tolerance, Decreased balance, Decreased endurance, Decreased mobility, Decreased range of motion, Decreased strength, Difficulty walking, Increased fascial restricitons, Increased muscle spasms, Impaired perceived functional ability, Impaired flexibility, Impaired UE functional use, Improper body mechanics, Postural dysfunction, Pain  Visit Diagnosis: Acute pain of right shoulder  Radiculopathy, cervical region  Acute bilateral low back pain without sciatica  Chronic pain of left knee  Muscle weakness (generalized)  Other symptoms and signs involving the musculoskeletal system  Abnormal posture     Problem List Patient Active Problem List   Diagnosis Date Noted  . Primary osteoarthritis of left knee 10/13/2019  . Acute bilateral low back pain without sciatica 10/13/2019  . Impingement syndrome of right shoulder 10/05/2019  . Cervical radiculopathy 09/27/2019  . Essential hypertension   . Pure hypercholesterolemia   . Chest pain 10/24/2017  . Morbid obesity ([place]) 10/24/2017  . Passive smoke exposure 10/24/2017  . Sinus congestion 10/24/2017    Amber Huffman, PTA 11/25/2019, 12:23 PM  [facility] [address]  [place], [place], [address] Phone: [phone]   Fax:  [phone]  Name: Amber Huffman MRN: [id number] Date of Birth: 03-15-1950

## 2019-11-28 ENCOUNTER — Encounter (HOSPITAL_BASED_OUTPATIENT_CLINIC_OR_DEPARTMENT_OTHER): Payer: Self-pay

## 2019-11-28 ENCOUNTER — Other Ambulatory Visit: Payer: Self-pay

## 2019-11-28 ENCOUNTER — Ambulatory Visit (HOSPITAL_BASED_OUTPATIENT_CLINIC_OR_DEPARTMENT_OTHER)
Admission: RE | Admit: 2019-11-28 | Discharge: 2019-11-28 | Disposition: A | Discharge status: Home / Self Care | Payer: Medicare Other | Source: Ambulatory Visit | Attending: Medical | Admitting: Medical

## 2019-11-28 DIAGNOSIS — Z1231 Encounter for screening mammogram for malignant neoplasm of breast: Secondary | ICD-10-CM | POA: Insufficient documentation

## 2019-11-29 ENCOUNTER — Ambulatory Visit: Payer: Medicare Other

## 2019-11-29 DIAGNOSIS — M25511 Pain in right shoulder: Secondary | ICD-10-CM | POA: Diagnosis not present

## 2019-11-29 DIAGNOSIS — M545 Low back pain, unspecified: Secondary | ICD-10-CM

## 2019-11-29 DIAGNOSIS — G8929 Other chronic pain: Secondary | ICD-10-CM

## 2019-11-29 DIAGNOSIS — M5412 Radiculopathy, cervical region: Secondary | ICD-10-CM

## 2019-11-29 DIAGNOSIS — R29898 Other symptoms and signs involving the musculoskeletal system: Secondary | ICD-10-CM

## 2019-11-29 DIAGNOSIS — R293 Abnormal posture: Secondary | ICD-10-CM

## 2019-11-29 DIAGNOSIS — M6281 Muscle weakness (generalized): Secondary | ICD-10-CM

## 2019-11-29 NOTE — Therapy (Signed)
[facility] [address]  [place], [place], [address] Phone: [phone]   Fax:  [phone]  Physical Therapy Treatment  Patient Details  Name: Amber Huffman MRN: [id number] Date of Birth: 1950/04/14 Referring Provider (PT): Clearance Coots, MD   Encounter Date: 11/29/2019   PT End of Session - 11/29/19 0945    Visit Number 10    Number of Visits 16    Date for PT Re-Evaluation 12/20/19    Authorization Type Medicare & State BCBS    Progress Note Due on Visit 15   MD PN on visit #5 - 11/11/19   PT Start Time 0940   Pt. arrived late   PT Stop Time 1013    PT Time Calculation (min) 33 min    Activity Tolerance Patient tolerated treatment well    Behavior During Therapy [facility] for tasks assessed/performed           Past Medical History:  Diagnosis Date  . Allergy   . Anemia   . Anxiety   . Arthritis    Thumbs  . Blood transfusion without reported diagnosis   . Depression   . Diabetes mellitus without complication ([place])    type 2  . GERD (gastroesophageal reflux disease)   . Hyperlipidemia   . Hypertension   . Morbid obesity with BMI of 50.0-59.9, adult ([place])   . Osteoporosis   . Pneumonia   . Substance abuse ([place])    ETOH abuse, quit drinking in 1985    Past Surgical History:  Procedure Laterality Date  . ABDOMINAL HYSTERECTOMY    . CERVIX REMOVAL    . CESAREAN SECTION    . COLONOSCOPY    . OOPHORECTOMY    . UPPER GASTROINTESTINAL ENDOSCOPY    . WISDOM TOOTH EXTRACTION      There were no vitals filed for this visit.   Subjective Assessment - 11/29/19 0944    Subjective Pt. with no new complaints however arrived late to session noting she been "running around all day".    Patient Stated Goals "to be able to walk well enough for her trip to [facility] on Dec 8"    Currently in Pain? No/denies    Pain Score 0-No pain    Multiple Pain Sites No                              [facility] Adult PT Treatment/Exercise - 11/29/19 0001      Self-Care   Self-Care Other Self-Care Comments    Other Self-Care Comments  Review and consolidation of HEP; seated march, levator scap, and UT stretch removed to consolidate HEP; pt. instructed to perform all activities with HEP 3x/week; pt. admitting to partial/limited HEP adherence thus deferred further addition to HEP      Lumbar Exercises: Aerobic   Nustep L4 x 6 min (UE/LE)      Knee/Hip Exercises: Standing   Hip Abduction Right;Left;10 reps;Knee straight    Abduction Limitations counter    Hip Extension Right;Left;10 reps;Knee straight    Extension Limitations counter                     PT Short Term Goals - 11/11/19 0937      PT SHORT TERM GOAL #1   Title Patient will be independent with initial HEP    Status Achieved   11/08/19     PT SHORT TERM  GOAL #2   Title Patient will verbalize/demonstrate understanding of neutral spine posture and proper body mechanics to reduce strain on cervical & lumbar spine    Status Achieved   11/08/19            PT Long Term Goals - 11/11/19 0937      PT LONG TERM GOAL #1   Title Patient will be independent with ongoing/advanced HEP +/- gym program    Status Partially Met    Target Date 12/20/19      PT LONG TERM GOAL #2   Title Patient will verbalize/demonstrate good awareness of neutral spine posture and proper body mechanics for daily tasks    Status Achieved   11/11/19     PT LONG TERM GOAL #3   Title Patient will demonstrate improved B UE strength to >/= 4+/5 for improved postural stability and functional UE use    Status Partially Met   11/11/19 - met except flexion 4/5   Target Date 12/20/19      PT LONG TERM GOAL #4   Title Patient will demonstrate improved B LE strength to >/= 4 to 4+/5 for improved stability and ease of mobility    Status Partially Met   11/11/19 - met except L hip adduction 4-/5   Target Date 12/20/19      PT LONG TERM GOAL #5    Title Patient will ambulate with good posture and normal gait pattern with or w/o LRAD w/o evidence of L knee instability to allow for pt to go on trip in December    Status Partially Met   11/11/19 - pt reports no recent incidences of pain limiting mobility or feeling that knee might give way   Target Date 12/20/19                 Plan - 11/29/19 0945    Clinical Impression Statement Pt. arrived late to session thus tx time limited.  expressed that she would like to make next PT session her last session and transition to a home/gym program as she is concerned about her bills.  Supervising PT approving plan to finish up with therapy at upcoming session thus focused session on consolidation of existing HEP.  Deferred further addition to HEP as pt. admitting to partial/limited compliance with current HEP.  Encouraged pt. to continue full HEP performance at 3x/week ongoing.  Will plan for final goal assessment and transition to the home program at upcoming visit.    Comorbidities multiple active problems (LBP, cervical radiculopathy, R shoulder impingement, L knee pain/instability) + HTN, OA, morbid obesity, DM    Rehab Potential Good    PT Frequency 2x / week    PT Duration 8 weeks    PT Treatment/Interventions ADLs/Self Care Home Management;Cryotherapy;Electrical Stimulation;Iontophoresis 4mg /ml Dexamethasone;Moist Heat;Traction;Ultrasound;DME Instruction;Gait training;Stair training;Functional mobility training;Therapeutic activities;Therapeutic exercise;Balance training;Neuromuscular re-education;Patient/family education;Manual techniques;Passive range of motion;Dry needling;Taping;Vasopneumatic Device;Spinal Manipulations;Joint Manipulations    PT Next Visit Plan Progress postural and core/lumbopelvic strengthening; manual therapy and modalities PRN    PT Home Exercise Plan 10/12 - HS, ITB, LTR, open book, UT & LS stretches, pelvic tilt, chin tuck & scap retraction; 10/26 - red TB standing  row; 11/5 - core activation + red TB standing rows/retraction & pallof press, supported squat, seated hip ADD isometric, marching & red TB clam    Consulted and Agree with Plan of Care Patient           Patient will benefit from skilled therapeutic intervention in  order to improve the following deficits and impairments:  Abnormal gait, Decreased activity tolerance, Decreased balance, Decreased endurance, Decreased mobility, Decreased range of motion, Decreased strength, Difficulty walking, Increased fascial restricitons, Increased muscle spasms, Impaired perceived functional ability, Impaired flexibility, Impaired UE functional use, Improper body mechanics, Postural dysfunction, Pain  Visit Diagnosis: Acute pain of right shoulder  Radiculopathy, cervical region  Acute bilateral low back pain without sciatica  Chronic pain of left knee  Muscle weakness (generalized)  Other symptoms and signs involving the musculoskeletal system  Abnormal posture     Problem List Patient Active Problem List   Diagnosis Date Noted  . Primary osteoarthritis of left knee 10/13/2019  . Acute bilateral low back pain without sciatica 10/13/2019  . Impingement syndrome of right shoulder 10/05/2019  . Cervical radiculopathy 09/27/2019  . Essential hypertension   . Pure hypercholesterolemia   . Chest pain 10/24/2017  . Morbid obesity ([place]) 10/24/2017  . Passive smoke exposure 10/24/2017  . Sinus congestion 10/24/2017    Bess Harvest, PTA 11/29/19 12:13 PM   [facility] [address]  [place], [place], [address] Phone: [phone]   Fax:  [phone]  Name: Amber Huffman MRN: [id number] Date of Birth: [birth year]

## 2019-12-02 ENCOUNTER — Other Ambulatory Visit: Payer: Self-pay

## 2019-12-02 ENCOUNTER — Encounter: Payer: Self-pay | Admitting: Physical Therapy

## 2019-12-02 ENCOUNTER — Ambulatory Visit: Payer: Medicare Other | Admitting: Physical Therapy

## 2019-12-02 DIAGNOSIS — M5412 Radiculopathy, cervical region: Secondary | ICD-10-CM

## 2019-12-02 DIAGNOSIS — M25562 Pain in left knee: Secondary | ICD-10-CM

## 2019-12-02 DIAGNOSIS — M25511 Pain in right shoulder: Secondary | ICD-10-CM

## 2019-12-02 DIAGNOSIS — G8929 Other chronic pain: Secondary | ICD-10-CM

## 2019-12-02 DIAGNOSIS — R29898 Other symptoms and signs involving the musculoskeletal system: Secondary | ICD-10-CM

## 2019-12-02 DIAGNOSIS — R293 Abnormal posture: Secondary | ICD-10-CM

## 2019-12-02 DIAGNOSIS — M545 Low back pain, unspecified: Secondary | ICD-10-CM

## 2019-12-02 DIAGNOSIS — M6281 Muscle weakness (generalized): Secondary | ICD-10-CM

## 2019-12-02 NOTE — Therapy (Addendum)
[facility] [address]  [place], [place], [address] Phone: [phone]   Fax:  [phone]  Physical Therapy Treatment / Discharge Summary  Patient Details  Name: Nene Aranas MRN: [id number] Date of Birth: May 13, 1950 Referring Provider (PT): Clearance Coots, MD   Encounter Date: 12/02/2019   PT End of Session - 12/02/19 0935    Visit Number 11    Number of Visits 16    Date for PT Re-Evaluation 12/20/19    Authorization Type Medicare & State BCBS    Progress Note Due on Visit 15   MD PN on visit #5 - 11/11/19   PT Start Time 0935    PT Stop Time 1014    PT Time Calculation (min) 39 min    Activity Tolerance Patient tolerated treatment well    Behavior During Therapy [facility] for tasks assessed/performed           Past Medical History:  Diagnosis Date  . Allergy   . Anemia   . Anxiety   . Arthritis    Thumbs  . Blood transfusion without reported diagnosis   . Depression   . Diabetes mellitus without complication ([place])    type 2  . GERD (gastroesophageal reflux disease)   . Hyperlipidemia   . Hypertension   . Morbid obesity with BMI of 50.0-59.9, adult ([place])   . Osteoporosis   . Pneumonia   . Substance abuse ([place])    ETOH abuse, quit drinking in 1985    Past Surgical History:  Procedure Laterality Date  . ABDOMINAL HYSTERECTOMY    . CERVIX REMOVAL    . CESAREAN SECTION    . COLONOSCOPY    . OOPHORECTOMY    . UPPER GASTROINTESTINAL ENDOSCOPY    . WISDOM TOOTH EXTRACTION      There were no vitals filed for this visit.   Subjective Assessment - 12/02/19 0937    Subjective Pt feels ready to transition to HEP.    Patient Stated Goals "to be able to walk well enough for her trip to [facility] on Dec 8"    Currently in Pain? No/denies              [facility] PT Assessment - 12/02/19 0935      Assessment   Medical Diagnosis Acute LBP, cervical radiculopathy, R shoulder impingement, L knee  pain/instability    Referring Provider (PT) Clearance Coots, MD    Onset Date/Surgical Date --   varies but grossly 2-3 months   Next MD Visit released by MD      Strength   Overall Strength Comments tested in sitting    Right Shoulder Flexion 4+/5    Right Shoulder ABduction 5/5    Right Shoulder Internal Rotation 5/5    Right Shoulder External Rotation 4+/5    Left Shoulder Flexion 4+/5    Left Shoulder ABduction 5/5    Left Shoulder Internal Rotation 5/5    Left Shoulder External Rotation 4+/5    Right Hip Flexion 5/5    Right Hip Extension 5/5    Right Hip External Rotation  5/5    Right Hip Internal Rotation 5/5    Right Hip ABduction 5/5    Right Hip ADduction 5/5    Left Hip Flexion 5/5    Left Hip Extension 5/5    Left Hip External Rotation 4+/5    Left Hip Internal Rotation 4/5    Left Hip ABduction 5/5  Left Hip ADduction 5/5    Right Knee Flexion 5/5    Right Knee Extension 5/5    Left Knee Flexion 5/5    Left Knee Extension 5/5    Right Ankle Dorsiflexion 5/5    Left Ankle Dorsiflexion 5/5      Ambulation/Gait   Gait Pattern Within Functional Limits;Step-through pattern                         [facility] Adult PT Treatment/Exercise - 12/02/19 0935      Lumbar Exercises: Sidelying   Other Sidelying Lumbar Exercises demonstration of L reverse clam in R sidelying      Knee/Hip Exercises: Seated   Other Seated Knee/Hip Exercises L hip reverse clam with yellow TB at ankles and ball btw knees 10 x 3"                  PT Education - 12/02/19 1011    Education Details HEP review + update: green TBs provided for continued self-progression of exercises at home & reverse clam added to address L hip IR weakness    Person(s) Educated Patient    Methods Explanation;Demonstration;Verbal cues;Handout    Comprehension Verbalized understanding;Verbal cues required;Returned demonstration            PT Short Term Goals - 11/11/19 0937      PT  SHORT TERM GOAL #1   Title Patient will be independent with initial HEP    Status Achieved   11/08/19     PT SHORT TERM GOAL #2   Title Patient will verbalize/demonstrate understanding of neutral spine posture and proper body mechanics to reduce strain on cervical & lumbar spine    Status Achieved   11/08/19            PT Long Term Goals - 12/02/19 0938      PT LONG TERM GOAL #1   Title Patient will be independent with ongoing/advanced HEP +/- gym program    Status Achieved   12/02/19     PT LONG TERM GOAL #2   Title Patient will verbalize/demonstrate good awareness of neutral spine posture and proper body mechanics for daily tasks    Status Achieved   11/11/19     PT LONG TERM GOAL #3   Title Patient will demonstrate improved B UE strength to >/= 4+/5 for improved postural stability and functional UE use    Status Achieved   12/02/19     PT LONG TERM GOAL #4   Title Patient will demonstrate improved B LE strength to >/= 4 to 4+/5 for improved stability and ease of mobility    Status Achieved   12/02/19     PT LONG TERM GOAL #5   Title Patient will ambulate with good posture and normal gait pattern with or w/o LRAD w/o evidence of L knee instability to allow for pt to go on trip in December    Status Achieved   12/02/19                Plan - 12/02/19 1014    Clinical Impression Statement Jeriann is very pleased with her progress and reports no recent pain. She is ambulating better with improved posture, step length and foot clearance allowing for normalized gait pattern with cane using lowering handle. Her overall UE and LE strength is significantly improved with average MMT 4+/5 to 5/5 with only exception being L hip IR 4/5. All goals now met.  She feels confident with current HEP including today's addition of hip IR strengthening and feels ready to transition to the HEP but would like to remain on hold for 30 days in the event that issues arise necessitating return to PT.     Comorbidities multiple active problems (LBP, cervical radiculopathy, R shoulder impingement, L knee pain/instability) + HTN, OA, morbid obesity, DM    Rehab Potential Good    PT Frequency --    PT Duration --    PT Treatment/Interventions ADLs/Self Care Home Management;Cryotherapy;Electrical Stimulation;Iontophoresis 8m/ml Dexamethasone;Moist Heat;Traction;Ultrasound;DME Instruction;Gait training;Stair training;Functional mobility training;Therapeutic activities;Therapeutic exercise;Balance training;Neuromuscular re-education;Patient/family education;Manual techniques;Passive range of motion;Dry needling;Taping;Vasopneumatic Device;Spinal Manipulations;Joint Manipulations    PT Next Visit Plan 30-day hold    PT Home Exercise Plan 10/12 - HS, ITB, LTR, open book, UT & LS stretches, pelvic tilt, chin tuck & scap retraction; 10/26 - red TB standing row; 11/5 - core activation + red TB standing rows/retraction & pallof press, supported squat, seated hip ADD isometric, marching & red TB clam; 11/19 - seated & supine reverse clam    Consulted and Agree with Plan of Care Patient           Patient will benefit from skilled therapeutic intervention in order to improve the following deficits and impairments:  Abnormal gait, Decreased activity tolerance, Decreased balance, Decreased endurance, Decreased mobility, Decreased range of motion, Decreased strength, Difficulty walking, Increased fascial restricitons, Increased muscle spasms, Impaired perceived functional ability, Impaired flexibility, Impaired UE functional use, Improper body mechanics, Postural dysfunction, Pain  Visit Diagnosis: Acute pain of right shoulder  Radiculopathy, cervical region  Acute bilateral low back pain without sciatica  Chronic pain of left knee  Muscle weakness (generalized)  Other symptoms and signs involving the musculoskeletal system  Abnormal posture     Problem List Patient Active Problem List    Diagnosis Date Noted  . Primary osteoarthritis of left knee 10/13/2019  . Acute bilateral low back pain without sciatica 10/13/2019  . Impingement syndrome of right shoulder 10/05/2019  . Cervical radiculopathy 09/27/2019  . Essential hypertension   . Pure hypercholesterolemia   . Chest pain 10/24/2017  . Morbid obesity ([place] 10/24/2017  . Passive smoke exposure 10/24/2017  . Sinus congestion 10/24/2017    JPercival Spanish PT, MPT 12/02/2019, 10:27 AM  [facility] [place] [address]Phone: [phone]  Fax:  [phone] Name: SLonya JohannesenMRN: [id number] of Birth: 115-Dec-1952  PHYSICAL THERAPY DISCHARGE SUMMARY  Visits from Start of Care: 11  Current functional level related to goals / functional outcomes:   Refer to above clinical impression for status as of last visit on 12/02/2019. Patient was placed on hold for 30 days and has not needed to return to PT, therefore will proceed with discharge from PT for this episode.   Remaining deficits:   As above.   Education / Equipment:   HEP, pBiomedical scientist Plan: Patient agrees to discharge.  Patient goals were met. Patient is being discharged due to meeting the stated rehab goals.  ?????     JPercival Spanish PT, MPT 01/16/20, 11:42 AM  [facility] [address]Phone: [phone]  Fax:  [phone]

## 2019-12-02 NOTE — Patient Instructions (Signed)
    Home exercise program created by Philisha Weinel, PT.  For questions, please contact Safira Proffit via phone at [phone] or email at [email or web address]  [facility] [place] [address]  [address] [place], , [address] Phone: [phone]   Fax:  [phone]    

## 2019-12-06 ENCOUNTER — Ambulatory Visit (INDEPENDENT_AMBULATORY_CARE_PROVIDER_SITE_OTHER): Payer: Medicare Other | Admitting: Medical

## 2019-12-06 ENCOUNTER — Telehealth: Payer: Self-pay | Admitting: Medical

## 2019-12-06 ENCOUNTER — Other Ambulatory Visit: Payer: Self-pay

## 2019-12-06 VITALS — BP 139/74 | HR 86 | Temp 97.9°F | Resp 18 | Ht 59.0 in | Wt 208.0 lb

## 2019-12-06 DIAGNOSIS — E559 Vitamin D deficiency, unspecified: Secondary | ICD-10-CM | POA: Diagnosis not present

## 2019-12-06 DIAGNOSIS — R079 Chest pain, unspecified: Secondary | ICD-10-CM

## 2019-12-06 DIAGNOSIS — R739 Hyperglycemia, unspecified: Secondary | ICD-10-CM | POA: Diagnosis not present

## 2019-12-06 DIAGNOSIS — E119 Type 2 diabetes mellitus without complications: Secondary | ICD-10-CM

## 2019-12-06 DIAGNOSIS — I6529 Occlusion and stenosis of unspecified carotid artery: Secondary | ICD-10-CM

## 2019-12-06 DIAGNOSIS — E785 Hyperlipidemia, unspecified: Secondary | ICD-10-CM

## 2019-12-06 DIAGNOSIS — E1169 Type 2 diabetes mellitus with other specified complication: Secondary | ICD-10-CM

## 2019-12-06 LAB — COMPREHENSIVE METABOLIC PANEL
ALT: 20 U/L (ref 0–35)
AST: 23 U/L (ref 0–37)
Albumin: 4 g/dL (ref 3.5–5.2)
Alkaline Phosphatase: 93 U/L (ref 39–117)
BUN: 19 mg/dL (ref 6–23)
CO2: 34 mEq/L — ABNORMAL HIGH (ref 19–32)
Calcium: 9.4 mg/dL (ref 8.4–10.5)
Chloride: 92 mEq/L — ABNORMAL LOW (ref 96–112)
Creatinine, Ser: 0.65 mg/dL (ref 0.40–1.20)
GFR: 88.29 mL/min (ref >60.00)
Glucose, Bld: 115 mg/dL — ABNORMAL HIGH (ref 70–99)
Potassium: 3.3 mEq/L — ABNORMAL LOW (ref 3.5–5.1)
Sodium: 134 mEq/L — ABNORMAL LOW (ref 135–145)
Total Bilirubin: 0.4 mg/dL (ref 0.2–1.2)
Total Protein: 7.3 g/dL (ref 6.0–8.3)

## 2019-12-06 LAB — TROPONIN I (HIGH SENSITIVITY): High Sens Troponin I: 11 ng/L (ref 2–17)

## 2019-12-06 NOTE — Telephone Encounter (Signed)
Labs arent back , once labs are back , pt will be called .

## 2019-12-06 NOTE — Patient Instructions (Signed)
You can have severe chest pain last night but by the time EMS worked you up/evaluated you you reported that the pain subsided completely.  Then some very low level second transient twinge pain around 11 PM.  No recurrent pain since.  I do think based on her high level pain last night that ED evaluation probably was indicated.  However presently with no pain do think work-up here is reasonable.  We will do 1 set of stat troponin under current circumstances.  However if you do have any recurrent chest pain as before then you need to be evaluated in the emergency department for at least 2 sets.  EKG done today showed sinus rhythm.  I did compare today's EKG to one done in 2019 and looked very similar.  No ischemic changes noted.  I went ahead and placed referral to cardiologist for further work-up.  As explained you do have various cardiovascular risk factors.  In addition remind you of your 10-year cardiovascular risk for.  Continue HCTZ for blood pressure control.  I do think it would be reasonable to go ahead and start Zetia and Metformin.  I do think when you evaluated by the cardiologist they are going to recommend controlling as best as possible all risk factors.  Can take aspirin daily basis.  For hypertension and diabetes did order metabolic panel.  Too early to order A1c.  Vitamin D deficiency replace vitamin D order.  Note some low level costochondral junction tenderness to palpation.  You can take Tylenol presently for that.  Although this might represent costochondritis I definitely do not think pain he had last night was due to costochondritis.  Follow-up in 7 to 10 days or as needed.

## 2019-12-06 NOTE — Telephone Encounter (Signed)
Patient calling again for results.

## 2019-12-06 NOTE — Telephone Encounter (Signed)
Will you see if can get in with caridolgoist by early next week.

## 2019-12-06 NOTE — Progress Notes (Signed)
Subjective:    Patient ID: Amber Huffman, female    DOB: Mar 03, 1950, 69 y.o.   MRN: [id number]  HPI  Pt in for onset of sudden severe chest pain that started last night that occurred about 11:40. Pain was severe. Pt thought it was her heart. EMS did go out. They did ekg. Pain subsided gradually. By time they saw pt and left pain resolved. Pt states had transient brief twinges last for seconds. No recurrent severe or twinge pain.  Pt not describing any abnormal shortness of breath.  Pt had not jaw pain, no back pain, no diaphoresis and no abdomen pain. No back pain.   Pt has high cholesterol and unable to start statin. I had written zetia as replacement but she never took.   Pt also diabetic but she does not take metformin.Last 1c was 6.4.  Pt bp is controlled today. She is taking hctz.  In the past informed pt of her ascvd score of 21% as attempting to have her start tx for diabetes and cholesterol      Review of Systems  Constitutional: Negative for chills, fatigue and fever.  Respiratory: Negative for cough, chest tightness, shortness of breath and wheezing.   Cardiovascular: Negative for chest pain and palpitations.  Gastrointestinal: Negative for abdominal pain.  Genitourinary: Negative for dysuria and frequency.  Musculoskeletal: Negative for back pain and neck pain.  Skin: Negative for rash.  Neurological: Negative for dizziness and headaches.  Hematological: Negative for adenopathy. Does not bruise/bleed easily.  Psychiatric/Behavioral: Negative for behavioral problems. The patient is not nervous/anxious.     Past Medical History:  Diagnosis Date  . Allergy   . Anemia   . Anxiety   . Arthritis    Thumbs  . Blood transfusion without reported diagnosis   . Depression   . Diabetes mellitus without complication ([place])    type 2  . GERD (gastroesophageal reflux disease)   . Hyperlipidemia   . Hypertension   . Morbid obesity with BMI of 50.0-59.9, adult ([place])   .  Osteoporosis   . Pneumonia   . Substance abuse ([place])    ETOH abuse, quit drinking in [place] History   Socioeconomic History  . Marital status: Married    Spouse name: Not on file  . Number of children: Not on file  . Years of education: Not on file  . Highest education level: Not on file  Occupational History  . Not on file  Tobacco Use  . Smoking status: Passive Smoke Exposure - Never Smoker  . Smokeless tobacco: Never Used  Vaping Use  . Vaping Use: Never used  Substance and Sexual Activity  . 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  . Drug use: Not Currently  . Sexual activity: Not on file    Comment: Hysterectomy  Other Topics Concern  . Not on file  Social History Narrative  . Not on file   Social Determinants of Health   Financial Resource Strain:   . Difficulty of Paying Living Expenses: Not on file  Food Insecurity:   . Worried About Charity fundraiser in the Last Year: Not on file  . Ran Out of Food in the Last Year: Not on file  Transportation Needs:   . Lack of Transportation (Medical): Not on file  . Lack of Transportation (  Non-Medical): Not on file  Physical Activity:   . Days of Exercise per Week: Not on file  . Minutes of Exercise per Session: Not on file  Stress:   . Feeling of Stress : Not on file  Social Connections:   . Frequency of Communication with Friends and Family: Not on file  . Frequency of Social Gatherings with Friends and Family: Not on file  . Attends Religious Services: Not on file  . Active Member of Clubs or Organizations: Not on file  . Attends Archivist Meetings: Not on file  . Marital Status: Not on file  Intimate Partner Violence:   . Fear of Current or Ex-Partner: Not on file  . Emotionally Abused: Not on file  . Physically Abused: Not on file  . Sexually Abused: Not on file     Past Surgical History:  Procedure Laterality Date  . ABDOMINAL HYSTERECTOMY    . CERVIX REMOVAL    . CESAREAN SECTION    . COLONOSCOPY    . OOPHORECTOMY    . UPPER GASTROINTESTINAL ENDOSCOPY    . WISDOM TOOTH EXTRACTION      Family History  Problem Relation Age of Onset  . Heart disease Father        After the age of 93  . Uterine cancer Mother   . Colon cancer Neg Hx   . Esophageal cancer Neg Hx   . Rectal cancer Neg Hx   . Stomach cancer Neg Hx     Allergies  Allergen Reactions  . Penicillins Rash    Has patient had a PCN reaction causing immediate rash, facial/tongue/throat swelling, SOB or lightheadedness with hypotension: Yes Has patient had a PCN reaction causing severe rash involving mucus membranes or skin necrosis: No Has patient had a PCN reaction that required hospitalization: No Has patient had a PCN reaction occurring within the last 10 years: No If all of the above answers are "NO", then may proceed with Cephalosporin use.    Current Outpatient Medications on File Prior to Visit  Medication Sig Dispense Refill  . acetaminophen (TYLENOL) 500 MG tablet Take 1,000 mg by mouth every 6 (six) hours as needed (shoulder pain.).    Marland Kitchen Blood Glucose Monitoring Suppl (ONETOUCH VERIO FLEX SYSTEM) w/Device KIT Check blood sugar TID  Dx: E11.9 (Patient not taking: Reported on 10/25/2019) 1 kit 0  . ezetimibe (ZETIA) 10 MG tablet Take 1 tablet (10 mg total) by mouth daily. (Patient not taking: Reported on 10/25/2019) 30 tablet 0  . glucose blood (ONETOUCH VERIO) test strip Check blood sugar TID  Dx: E11.9 (Patient not taking: Reported on 10/25/2019) 300 each 2  . hydrochlorothiazide (HYDRODIURIL) 25 MG tablet Take 1 tablet by mouth once daily 90 tablet 0  . ibuprofen (ADVIL) 600 MG tablet Take 1 tablet (600 mg total) by mouth every 8 (eight) hours as needed. 30 tablet 0  . Lancets (ONETOUCH DELICA PLUS [id number]) MISC Check blood sugar TID  Dx: E11.9 (Patient not  taking: Reported on 10/25/2019) 300 each 2  . meclizine (ANTIVERT) 12.5 MG tablet Take 1 tablet (12.5 mg total) by mouth 3 (three) times daily as needed for dizziness. (Patient not taking: Reported on 10/25/2019) 30 tablet 0  . metFORMIN (GLUCOPHAGE) 500 MG tablet Take 1 tablet (500 mg total) by mouth 2 (two) times daily with a meal. (Patient not taking: Reported on 10/25/2019) 30 tablet 3  . Vitamin D, Ergocalciferol, (DRISDOL) 1.25 MG (50000 UT) CAPS capsule Take 1 capsule (50,000  Units total) by mouth every Thursday. (Patient taking differently: Take 50,000 Units by mouth every 14 (fourteen) days. Wednesdays) 5 capsule 5   Current Facility-Administered Medications on File Prior to Visit  Medication Dose Route Frequency Provider Last Rate Last Admin  . 0.9 %  sodium chloride infusion  500 mL Intravenous Once Jackquline Denmark, MD        BP 139/74   Pulse 86   Temp 97.9 F (36.6 C) (Oral)   Resp 18   Ht $R'4\' 11"'GZ$  (1.499 m)   Wt 208 lb (94.3 kg)   SpO2 94%   BMI 42.01 kg/m       Objective:   Physical Exam  General Mental Status- Alert. General Appearance- Not in acute distress.   Skin General: Color- Normal Color. Moisture- Normal Moisture.  Neck Carotid Arteries- Normal color. Moisture- Normal Moisture.Marland Kitchen No JVD. No tracheal deviation.  Chest and Lung Exam Auscultation: Breath Sounds:-Normal.  Cardiovascular Auscultation:Rythm- Regular. Murmurs & Other Heart Sounds:Auscultation of the heart reveals- No Murmurs.  Abdomen Inspection:-Inspeection Normal. Palpation/Percussion:Note:No mass. Palpation and Percussion of the abdomen reveal- Non Tender, Non Distended + BS, no rebound or guarding.    Neurologic Cranial Nerve exam:- CN III-XII intact(No nystagmus), symmetric smile. Strength:- 5/5 equal and symmetric strength both upper and lower extremities.  Anterior chest- faint left side costochondral junction tenderness to palpation.  Back- no cva pain.  Lower ext- no  pedal edema.      Assessment & Plan:  [phone] You can have severe chest pain last night but by the time EMS worked you up/evaluated you you reported that the pain subsided completely.  Then some very low level second transient twinge pain around 11 PM.  No recurrent pain since.  I do think based on her high level pain last night that ED evaluation probably was indicated.  However presently with no pain do think work-up here is reasonable.  We will do 1 set of stat troponin under current circumstances.  However if you do have any recurrent chest pain as before then you need to be evaluated in the emergency department for at least 2 sets.  I went ahead and placed referral to cardiologist for further work-up.  As explained you do have various cardiovascular risk factors.  In addition remind you of your 10-year cardiovascular risk for.  Continue HCTZ for blood pressure control.  I do think it would be reasonable to go ahead and start Zetia and Metformin.  I do think when you evaluated by the cardiologist they are going to recommend controlling as best as possible all risk factors.  Can take aspirin daily basis.  For hypertension and diabetes did order metabolic panel.  Too early to order A1c.  Vitamin D deficiency replace vitamin D order.  Follow-up in 7 to 10 days or as needed.  Time spent with patient today was 40+  minutes which consisted of chart review, discussing diagnoses, work up, treatment and documentation.

## 2019-12-06 NOTE — Telephone Encounter (Signed)
Patient would like to know if you got the troponin lab results

## 2019-12-07 ENCOUNTER — Telehealth: Payer: Self-pay | Admitting: Medical

## 2019-12-07 MED ORDER — POTASSIUM CHLORIDE ER 10 MEQ PO TBCR: 10.0000 meq | EXTENDED_RELEASE_TABLET | Freq: Every day | ORAL | 0 refills | Status: DC

## 2019-12-07 NOTE — Telephone Encounter (Signed)
Rx potassium sent to pharmacy.

## 2019-12-07 NOTE — Telephone Encounter (Signed)
Labs results are In , called pt and lvm to return call .

## 2019-12-09 LAB — VITAMIN D 1,25 DIHYDROXY
Vitamin D 1, 25 (OH)2 Total: 32 pg/mL (ref 18–72)
Vitamin D2 1, 25 (OH)2: 23 pg/mL
Vitamin D3 1, 25 (OH)2: 9 pg/mL

## 2019-12-13 ENCOUNTER — Ambulatory Visit: Payer: Medicare Other

## 2019-12-14 ENCOUNTER — Encounter: Payer: Self-pay | Admitting: Cardiovascular Disease

## 2019-12-14 ENCOUNTER — Ambulatory Visit (INDEPENDENT_AMBULATORY_CARE_PROVIDER_SITE_OTHER): Payer: Medicare Other | Admitting: Cardiovascular Disease

## 2019-12-14 ENCOUNTER — Other Ambulatory Visit: Payer: Self-pay

## 2019-12-14 VITALS — BP 118/78 | HR 104 | Ht 58.5 in | Wt 210.0 lb

## 2019-12-14 DIAGNOSIS — Z01812 Encounter for preprocedural laboratory examination: Secondary | ICD-10-CM | POA: Diagnosis not present

## 2019-12-14 DIAGNOSIS — R931 Abnormal findings on diagnostic imaging of heart and coronary circulation: Secondary | ICD-10-CM

## 2019-12-14 DIAGNOSIS — E7800 Pure hypercholesterolemia, unspecified: Secondary | ICD-10-CM

## 2019-12-14 DIAGNOSIS — R079 Chest pain, unspecified: Secondary | ICD-10-CM

## 2019-12-14 DIAGNOSIS — I10 Essential (primary) hypertension: Secondary | ICD-10-CM | POA: Diagnosis not present

## 2019-12-14 DIAGNOSIS — I6529 Occlusion and stenosis of unspecified carotid artery: Secondary | ICD-10-CM

## 2019-12-14 DIAGNOSIS — I119 Hypertensive heart disease without heart failure: Secondary | ICD-10-CM

## 2019-12-14 MED ORDER — METOPROLOL TARTRATE 100 MG PO TABS: ORAL_TABLET | ORAL | 0 refills | Status: DC

## 2019-12-14 MED ORDER — IVABRADINE HCL 5 MG PO TABS: ORAL_TABLET | ORAL | 0 refills | Status: DC

## 2019-12-14 NOTE — Patient Instructions (Addendum)
Medication Instructions:  TAKE METOPROLOL 100 MG 2 HOURS PRIOR TO CT  TAKE CORLANOR 5 MG 2 HOURS PRIOR TO CT   *If you need a refill on your cardiac medications before your next appointment, please call your pharmacy*   Lab Work: BMET 1 WEEK PRIOR TO CT  If you have labs (blood work) drawn today and your tests are completely normal, you will receive your results only by: Marland Kitchen MyChart Message (if you have MyChart) OR . A paper copy in the mail If you have any lab test that is abnormal or we need to change your treatment, we will call you to review the results.   Testing/Procedures: Your physician has requested that you have cardiac CT. Cardiac computed tomography (CT) is a painless test that uses an x-ray machine to take clear, detailed pictures of your heart. For further information please visit [email or web address] Please follow instruction sheet as given. ONCE INSURANCE HAS BEEN REVIEWED THE OFFICE WILL CALL TO SCHEDULE   Follow-Up: KEEP FOLLOW UP IN FEBRUARY    Other Instructions  Your cardiac CT will be scheduled at one of the below locations:   [facility] [address] [place], [place] [address] [phone]  OR  [facility] [address] [place], [place] [address] [phone]  If scheduled at [facility], please arrive at the [facility] main entrance of [facility] 30 minutes prior to test start time. Proceed to the [facility] Radiology Department (first floor) to check-in and test prep.  If scheduled at [facility], please arrive 15 mins early for check-in and test prep.  Please follow these instructions carefully (unless otherwise directed):  Hold all erectile dysfunction medications at least 3 days (72 hrs) prior to test.  On the Night Before the Test: . Be sure to Drink plenty of water. . Do not consume any caffeinated/decaffeinated beverages or chocolate  12 hours prior to your test. . Do not take any antihistamines 12 hours prior to your test. . If the patient has contrast allergy: ? Patient will need a prescription for Prednisone and very clear instructions (as follows): 1. Prednisone 50 mg - take 13 hours prior to test 2. Take another Prednisone 50 mg 7 hours prior to test 3. Take another Prednisone 50 mg 1 hour prior to test 4. Take Benadryl 50 mg 1 hour prior to test . Patient must complete all four doses of above prophylactic medications. . Patient will need a ride after test due to Benadryl.  On the Day of the Test: . Drink plenty of water. Do not drink any water within one hour of the test. . Do not eat any food 4 hours prior to the test. . You may take your regular medications prior to the test.  . Take metoprolol (Lopressor) two hours prior to test. . HOLD Furosemide/Hydrochlorothiazide morning of the test. . FEMALES- please wear underwire-free bra if available  After the Test: . Drink plenty of water. . After receiving IV contrast, you may experience a mild flushed feeling. This is normal. . On occasion, you may experience a mild rash up to 24 hours after the test. This is not dangerous. If this occurs, you can take Benadryl 25 mg and increase your fluid intake. . If you experience trouble breathing, this can be serious. If it is severe call 911 IMMEDIATELY. If it is mild, please call our office. . If you take any of these medications: Glipizide/Metformin,  Avandament, Glucavance, please do not take 48 hours after completing test unless otherwise instructed.   Once we have confirmed authorization from your insurance company, we will call you to set up a date and time for your test. Based on how quickly your insurance processes prior authorizations requests, please allow up to 4 weeks to be contacted for scheduling your Cardiac CT appointment. Be advised that routine Cardiac CT appointments could be scheduled as many as 8 weeks  after your provider has ordered it.  For non-scheduling related questions, please contact the cardiac imaging nurse navigator should you have any questions/concerns: Rockwell Alexandria, Cardiac Imaging Nurse Navigator Mitzi Hansen, Interim Cardiac Imaging Nurse Navigator [facility] Direct Office Dial: [phone]   For scheduling needs, including cancellations and rescheduling, please call [place], [phone].   Cardiac CT Angiogram A cardiac CT angiogram is a procedure to look at the heart and the area around the heart. It may be done to help find the cause of chest pains or other symptoms of heart disease. During this procedure, a substance called contrast dye is injected into the blood vessels in the area to be checked. A large X-ray machine, called a CT scanner, then takes detailed pictures of the heart and the surrounding area. The procedure is also sometimes called a coronary CT angiogram, coronary artery scanning, or CTA. A cardiac CT angiogram allows the health care provider to see how well blood is flowing to and from the heart. The health care provider will be able to see if there are any problems, such as:  Blockage or narrowing of the coronary arteries in the heart.  Fluid around the heart.  Signs of weakness or disease in the muscles, valves, and tissues of the heart. Tell a health care provider about:  Any allergies you have. This is especially important if you have had a previous allergic reaction to contrast dye.  All medicines you are taking, including vitamins, herbs, eye drops, creams, and over-the-counter medicines.  Any blood disorders you have.  Any surgeries you have had.  Any medical conditions you have.  Whether you are pregnant or may be pregnant.  Any anxiety disorders, chronic pain, or other conditions you have that may increase your stress or prevent you from lying still. What are the risks? Generally, this is a safe procedure.  However, problems may occur, including:  Bleeding.  Infection.  Allergic reactions to medicines or dyes.  Damage to other structures or organs.  Kidney damage from the contrast dye that is used.  Increased risk of cancer from radiation exposure. This risk is low. Talk with your health care provider about: ? The risks and benefits of testing. ? How you can receive the lowest dose of radiation. What happens before the procedure?  Wear comfortable clothing and remove any jewelry, glasses, dentures, and hearing aids.  Follow instructions from your health care provider about eating and drinking. This may include: ? For 12 hours before the procedure -- avoid caffeine. This includes tea, coffee, soda, energy drinks, and diet pills. Drink plenty of water or other fluids that do not have caffeine in them. Being well hydrated can prevent complications. ? For 4-6 hours before the procedure -- stop eating and drinking. The contrast dye can cause nausea, but this is less likely if your stomach is empty.  Ask your health care provider about changing or stopping your regular medicines. This is especially important if you are taking diabetes medicines, blood thinners, or medicines  to treat problems with erections (erectile dysfunction). What happens during the procedure?   Hair on your chest may need to be removed so that small sticky patches called electrodes can be placed on your chest. These will transmit information that helps to monitor your heart during the procedure.  An IV will be inserted into one of your veins.  You might be given a medicine to control your heart rate during the procedure. This will help to ensure that good images are obtained.  You will be asked to lie on an exam table. This table will slide in and out of the CT machine during the procedure.  Contrast dye will be injected into the IV. You might feel warm, or you may get a metallic taste in your mouth.  You will be  given a medicine called nitroglycerin. This will relax or dilate the arteries in your heart.  The table that you are lying on will move into the CT machine tunnel for the scan.  The person running the machine will give you instructions while the scans are being done. You may be asked to: ? Keep your arms above your head. ? Hold your breath. ? Stay very still, even if the table is moving.  When the scanning is complete, you will be moved out of the machine.  The IV will be removed. The procedure may vary among health care providers and hospitals. What can I expect after the procedure? After your procedure, it is common to have:  A metallic taste in your mouth from the contrast dye.  A feeling of warmth.  A headache from the nitroglycerin. Follow these instructions at home:  Take over-the-counter and prescription medicines only as told by your health care provider.  If you are told, drink enough fluid to keep your urine pale yellow. This will help to flush the contrast dye out of your body.  Most people can return to their normal activities right after the procedure. Ask your health care provider what activities are safe for you.  It is up to you to get the results of your procedure. Ask your health care provider, or the department that is doing the procedure, when your results will be ready.  Keep all follow-up visits as told by your health care provider. This is important. Contact a health care provider if:  You have any symptoms of allergy to the contrast dye. These include: ? Shortness of breath. ? Rash or hives. ? A racing heartbeat. Summary  A cardiac CT angiogram is a procedure to look at the heart and the area around the heart. It may be done to help find the cause of chest pains or other symptoms of heart disease.  During this procedure, a large X-ray machine, called a CT scanner, takes detailed pictures of the heart and the surrounding area after a contrast dye has  been injected into blood vessels in the area.  Ask your health care provider about changing or stopping your regular medicines before the procedure. This is especially important if you are taking diabetes medicines, blood thinners, or medicines to treat erectile dysfunction.  If you are told, drink enough fluid to keep your urine pale yellow. This will help to flush the contrast dye out of your body. This information is not intended to replace advice given to you by your health care provider. Make sure you discuss any questions you have with your health care provider. Document Revised: 08/25/2018 Document Reviewed: 08/25/2018 Elsevier Patient Education  [place].

## 2019-12-14 NOTE — Progress Notes (Signed)
Cardiology Office Note   Date:  12/14/2019   ID:  Amber Huffman, DOB 1950/03/13, MRN [id number]  PCP:  Mackie Pai, PA-C  Cardiologist:  Skeet Latch, MD  Electrophysiologist:  None   Evaluation Performed:  Follow-Up Visit  Chief Complaint:  hyperlipidemia  History of Present Illness:    Amber Huffman is a 69 y.o. female with morbid obesity, hypertension and hyperlipidemia here for follow up.  She was initially seen in the hospital 10/2017 where she presented with chest pain.  She was diagnosed with hypertension and hyperlipidemia that admission.  She was started on hydrochlorothiazide.  Cardiac enzymes were negative and EKG was unremarkable.  Her chest pain was felt to be atypical and she was set up for outpatient stress testing.  She followed up with Amber Domino, DNP,  two weeks later.  She had not filled her medication as she is "not a medicine person."  She also rescheduled her testing.  She had an echo later that month that revealed LVEF 60 to 65% with grade 1 diastolic dysfunction.  She had a [employer] 11/2017 that revealed normal systolic function and no ischemia.  Since that time she was started on atorvastatin but did not tolerate it due to myalgias.  She followed up with our pharmacist and they recommended starting rosuvastatin 5 mg twice a week but she declined.  She wanted to work on diet and exercise.  At her last appointment Amber Huffman was doing well physically but was not exercising much due to COVID-19.  She was referred to the healthy weight and wellness clinic but hasn't been going.   She has been struggling with her gallbladder and was seen in the ED. She had a severe episodes of chest pain that started after eating salad.  She had severe nausea and near emesis.  She called EMS and her EKG was reportedly OK.  Her BP was elevated.  She has been walking while shopping but not exercising.  She has no exertional chest pain.  She gets short of breath rushing  while walking and thinks her mask is making it worse. She denies LE edema, orthopnea or PND.  Her PCP prescribed Zetia but she didn't get it.  She only takes HCTZ.   She is very afraid of having an adverse reaction after she tried a statin in the past.  Past Medical History:  Diagnosis Date  . Allergy   . Anemia   . Anxiety   . Arthritis    Thumbs  . Blood transfusion without reported diagnosis   . Depression   . Diabetes mellitus without complication ([place])    type 2  . GERD (gastroesophageal reflux disease)   . Hyperlipidemia   . Hypertension   . Morbid obesity with BMI of 50.0-59.9, adult ([place])   . Osteoporosis   . Pneumonia   . Substance abuse ([place])    ETOH abuse, quit drinking in 1985   Past Surgical History:  Procedure Laterality Date  . ABDOMINAL HYSTERECTOMY    . CERVIX REMOVAL    . CESAREAN SECTION    . COLONOSCOPY    . OOPHORECTOMY    . UPPER GASTROINTESTINAL ENDOSCOPY    . WISDOM TOOTH EXTRACTION       Current Meds  Medication Sig  . acetaminophen (TYLENOL) 500 MG tablet Take 1,000 mg by mouth every 6 (six) hours as needed (shoulder pain.).  Marland Kitchen hydrochlorothiazide (HYDRODIURIL) 25 MG tablet Take 1 tablet by mouth once daily  . ibuprofen (ADVIL) 600  MG tablet Take 1 tablet (600 mg total) by mouth every 8 (eight) hours as needed.   Current Facility-Administered Medications for the 12/14/19 encounter (Office Visit) with Skeet Latch, MD  Medication  . 0.9 %  sodium chloride infusion     Allergies:   Penicillins   Social History   Tobacco Use  . Smoking status: Passive Smoke Exposure - Never Smoker  . Smokeless tobacco: Never Used  Vaping Use  . Vaping Use: Never used  Substance Use Topics  . 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  . Drug use: Not Currently     Family Hx: The patient's family history includes Heart disease in  her father; Uterine cancer in her mother. There is no history of Colon cancer, Esophageal cancer, Rectal cancer, or Stomach cancer.  ROS:   Please see the history of present illness.    All other systems reviewed and are negative.   Prior CV studies:   The following studies were reviewed today:  Echo 11/12/17: Study Conclusions  - Left ventricle: The cavity size was normal. There was moderate concentric hypertrophy. Systolic function was normal. The estimated ejection fraction was in the range of 60% to 65%. Wall motion was normal; there were no regional wall motion abnormalities. There was an increased relative contribution of atrial contraction to ventricular filling. Doppler parameters are consistent with abnormal left ventricular relaxation (grade 1 diastolic dysfunction). - Mitral valve: Calcified annulus. There was trivial regurgitation.  Lexiscan Myoview 11/13/17:  Nuclear stress EF: 81%.  The left ventricular ejection fraction is hyperdynamic (>65%).  There was no ST segment deviation noted during stress.  The study is normal.  This is a low risk study.  Normal resting and stress perfusion. No ischemia or infarction EF 81%  Labs/Other Tests and Data Reviewed:    EKG:  An ECG dated 07/19/19 was personally reviewed today and demonstrated:  sinus rhythm.  Rate 84 bpm 12/14/2019: Sinus tachycardia.  Rate 104 bpm.  Right axis deviation.  Recent Labs: 02/23/2019: TSH 2.62 10/05/2019: Hemoglobin 15.1; Platelets 358 12/06/2019: ALT 20; BUN 19; Creatinine, Ser 0.65; Potassium 3.3; Sodium 134   Recent Lipid Panel Lab Results  Component Value Date/Time   CHOL 223 (H) 02/23/2019 01:15 PM   CHOL 213 (H) 02/18/2018 10:20 AM   TRIG 126.0 02/23/2019 01:15 PM   HDL 40.80 02/23/2019 01:15 PM   HDL 43 02/18/2018 10:20 AM   CHOLHDL 5 02/23/2019 01:15 PM   LDLCALC 157 (H) 02/23/2019 01:15 PM   LDLCALC 152 (H) 02/18/2018 10:20 AM    Wt Readings from Last 3  Encounters:  12/14/19 210 lb (95.3 kg)  12/06/19 208 lb (94.3 kg)  11/11/19 220 lb (99.8 kg)     Objective:    VS:  BP 118/78   Pulse (!) 104   Ht 4' 10.5" (1.486 m)   Wt 210 lb (95.3 kg)   SpO2 95%   BMI 43.14 kg/m  , BMI Body mass index is 43.14 kg/m. GENERAL:  Well appearing HEENT: Pupils equal round and reactive, fundi not visualized, oral mucosa unremarkable NECK:  No jugular venous distention, waveform within normal limits, carotid upstroke brisk and symmetric, no bruits LUNGS:  Clear to auscultation bilaterally HEART:  RRR.  PMI not displaced or sustained,S1 and S2 within normal limits, no S3, no S4, no clicks, no rubs, no murmurs ABD:  Flat, positive bowel sounds normal in frequency in pitch, no bruits, no rebound, no guarding, no midline pulsatile mass, no hepatomegaly, no splenomegaly EXT:  2 plus pulses throughout, no edema, no cyanosis no clubbing SKIN:  No rashes no nodules NEURO:  Cranial nerves II through XII grossly intact, motor grossly intact throughout PSYCH:  Cognitively intact, oriented to person place and time  ASSESSMENT & PLAN:    # Atypical chest pain: Ms. Celaya had anohter epside of chest pain that occurred after eating.  Lexiscan Myoview negative 10/2017.  Had a difficult time convincing her to treat her lipids.  Getting a coronary CT-A may be a good way of assessing her for ischemia and getting a better understanding of her disease burden.  She is agreeable.  We will give metoprolol 100 mg and ivabradine 5 mg 2 hours prior to her study.  Is quite possible that this is all related to her gallbladder.  She ultimately needs it removed then a better understanding of her coronaries with this area prior to her surgery.  # Hypertension: BP controlled on HCTZ.  No changes.   # Morbid obesity: # Hyperlipidemia: Ms. Thielke previously tried atorvastatin and did not tolerate it. We recommended low dose intermittent rosuvastatin and she refused.  Her PCP ordered  Zetia but she is not taking it.  She also remains uninterested in changing her diet.  Coronary CT-a as above.  Will reevaluate after that.   Medication Adjustments/Labs and Tests Ordered: Current medicines are reviewed at length with the patient today.  Concerns regarding medicines are outlined above.   Tests Ordered: Orders Placed This Encounter  Procedures  . CT CORONARY MORPH W/CTA COR W/SCORE W/CA W/CM &/OR WO/CM  . CT CORONARY FRACTIONAL FLOW RESERVE DATA PREP  . CT CORONARY FRACTIONAL FLOW RESERVE FLUID ANALYSIS  . Basic metabolic panel  . EKG 12-Lead    Medication Changes: Meds ordered this encounter  Medications  . metoprolol tartrate (LOPRESSOR) 100 MG tablet    Sig: TAKE 1 TABLET 2 HOURS PRIOR TO CT    Dispense:  1 tablet    Refill:  0  . ivabradine (CORLANOR) 5 MG TABS tablet    Sig: TAKE 1 TABLET 2 HOURS PRIOR TO CT    Dispense:  1 tablet    Refill:  0    Disposition:  Follow up in February as scheduled  Signed, Skeet Latch, MD  12/14/2019 5:20 PM    [place]

## 2019-12-16 ENCOUNTER — Encounter: Payer: Medicare Other | Admitting: Physical Therapy

## 2019-12-20 ENCOUNTER — Encounter: Payer: Medicare Other | Admitting: Physical Therapy

## 2019-12-27 ENCOUNTER — Ambulatory Visit (HOSPITAL_COMMUNITY): Payer: Medicare Other

## 2019-12-27 ENCOUNTER — Telehealth (HOSPITAL_COMMUNITY): Payer: Self-pay | Admitting: Emergency Medicine

## 2019-12-27 NOTE — Telephone Encounter (Signed)
Attempted to call patient regarding upcoming cardiac CT appointment. °Left message on voicemail with name and callback number °Deannie Resetar RN Navigator Cardiac Imaging °[facility] °[phone] Office °[phone] Cell ° °

## 2019-12-29 ENCOUNTER — Other Ambulatory Visit: Payer: Self-pay

## 2019-12-29 ENCOUNTER — Ambulatory Visit (HOSPITAL_COMMUNITY)
Admission: RE | Admit: 2019-12-29 | Discharge: 2019-12-29 | Disposition: A | Discharge status: Home / Self Care | Payer: Medicare Other | Source: Ambulatory Visit | Attending: Cardiovascular Disease | Admitting: Cardiovascular Disease

## 2019-12-29 DIAGNOSIS — E7800 Pure hypercholesterolemia, unspecified: Secondary | ICD-10-CM | POA: Diagnosis present

## 2019-12-29 DIAGNOSIS — I10 Essential (primary) hypertension: Secondary | ICD-10-CM | POA: Insufficient documentation

## 2019-12-29 DIAGNOSIS — I119 Hypertensive heart disease without heart failure: Secondary | ICD-10-CM | POA: Insufficient documentation

## 2019-12-29 DIAGNOSIS — R079 Chest pain, unspecified: Secondary | ICD-10-CM | POA: Diagnosis present

## 2019-12-29 DIAGNOSIS — Z006 Encounter for examination for normal comparison and control in clinical research program: Secondary | ICD-10-CM

## 2019-12-29 MED ORDER — NITROGLYCERIN 0.4 MG SL SUBL
0.8000 mg | SUBLINGUAL_TABLET | SUBLINGUAL | Status: DC | PRN
  Administered 2019-12-29: 13:00:00 0.8 mg via SUBLINGUAL

## 2019-12-29 MED ORDER — NITROGLYCERIN 0.4 MG SL SUBL
SUBLINGUAL_TABLET | SUBLINGUAL | Status: AC
  Filled 2019-12-29: qty 2

## 2019-12-29 MED ORDER — IOHEXOL 350 MG/ML SOLN
80.0000 mL | Freq: Once | INTRAVENOUS | Status: AC | PRN
  Administered 2019-12-29: 13:00:00 80 mL via INTRAVENOUS

## 2019-12-29 NOTE — Research (Signed)
IDENTIFY Informed Consent   Subject Name: Amber Huffman  Subject met inclusion and exclusion criteria.  The informed consent form, study requirements and expectations were reviewed with the subject and questions and concerns were addressed prior to the signing of the consent form.  The subject verbalized understanding of the trial requirements.  The subject agreed to participate in the IDENTIFY trial and signed the informed consent at 1112 on 16/DEC/2021.  The informed consent was obtained prior to performance of any protocol-specific procedures for the subject.  A copy of the signed informed consent was given to the subject and a copy was placed in the subject's medical record.   [name]

## 2020-01-05 ENCOUNTER — Telehealth: Payer: Self-pay | Admitting: Cardiovascular Disease

## 2020-01-05 NOTE — Telephone Encounter (Signed)
    Pt would like to get CT result, pt requesting to send result to her in the mail

## 2020-01-05 NOTE — Telephone Encounter (Signed)
Spoke with patient. Informed patient we are waiting for results to be read / interpreted. Once read a nurse will call to review and can mail a letter to patient at that time. Patient verbalized understanding.

## 2020-01-18 NOTE — Telephone Encounter (Addendum)
Amber Ingles, LPN  11/15/1592 12:29 PM EST      Left detailed message as noted for patient (DPR), call back w/any questions   Chilton Si, MD  01/05/2020 6:14 PM EST      Coronary arteries are normal.

## 2020-02-03 ENCOUNTER — Telehealth: Payer: Self-pay | Admitting: Medical

## 2020-02-03 ENCOUNTER — Other Ambulatory Visit: Payer: Self-pay | Admitting: Medical

## 2020-02-03 MED ORDER — VITAMIN D (ERGOCALCIFEROL) 1.25 MG (50000 UNIT) PO CAPS: ORAL_CAPSULE | ORAL | 0 refills | Status: DC

## 2020-02-03 MED ORDER — HYDROCHLOROTHIAZIDE 25 MG PO TABS: 25.0000 mg | ORAL_TABLET | Freq: Every day | ORAL | 0 refills | Status: DC

## 2020-02-03 NOTE — Telephone Encounter (Signed)
Rx sent 

## 2020-02-03 NOTE — Telephone Encounter (Signed)
Medication: hydrochlorothiazide (HYDRODIURIL) 25 MG tablet [[id number]]   Vitamin D, Ergocalciferol, (DRISDOL) 1.25 MG (50000 UT) CAPS capsule [[id number]     Has the patient contacted their pharmacy? no (If no, request that the patient contact the pharmacy for the refill.) (If yes, when and what did the pharmacy advise?)    Preferred Pharmacy (with phone number or street name):   [facility] [address] [place], [place] - [address]  [address] [place] [address]  Phone:  [phone] Fax:  [phone]     Agent: Please be advised that RX refills may take up to 3 business days. We ask that you follow-up with your pharmacy.

## 2020-02-04 ENCOUNTER — Other Ambulatory Visit: Payer: Self-pay | Admitting: Medical

## 2020-02-13 ENCOUNTER — Other Ambulatory Visit: Payer: Self-pay

## 2020-02-13 MED ORDER — VITAMIN D (ERGOCALCIFEROL) 1.25 MG (50000 UNIT) PO CAPS: ORAL_CAPSULE | ORAL | 0 refills | Status: DC

## 2020-02-22 ENCOUNTER — Other Ambulatory Visit: Payer: Self-pay

## 2020-02-22 ENCOUNTER — Ambulatory Visit (INDEPENDENT_AMBULATORY_CARE_PROVIDER_SITE_OTHER): Payer: Medicare Other | Admitting: Cardiovascular Disease

## 2020-02-22 ENCOUNTER — Encounter: Payer: Self-pay | Admitting: Cardiovascular Disease

## 2020-02-22 VITALS — BP 122/78 | HR 73 | Ht 58.5 in | Wt 203.4 lb

## 2020-02-22 DIAGNOSIS — J988 Other specified respiratory disorders: Secondary | ICD-10-CM

## 2020-02-22 DIAGNOSIS — R7303 Prediabetes: Secondary | ICD-10-CM

## 2020-02-22 DIAGNOSIS — I10 Essential (primary) hypertension: Secondary | ICD-10-CM

## 2020-02-22 DIAGNOSIS — E7800 Pure hypercholesterolemia, unspecified: Secondary | ICD-10-CM | POA: Diagnosis not present

## 2020-02-22 DIAGNOSIS — R079 Chest pain, unspecified: Secondary | ICD-10-CM

## 2020-02-22 NOTE — Progress Notes (Signed)
Cardiology Office Note   Date:  02/22/2020   ID:  Amber Huffman, DOB 1950/05/02, MRN [id number]  PCP:  Mackie Pai, PA-C  Cardiologist:  Skeet Latch, MD  Electrophysiologist:  None   Evaluation Performed:  Follow-Up Visit  Chief Complaint:  hyperlipidemia  History of Present Illness:    Amber Huffman is a 70 y.o. female with morbid obesity, hypertension and hyperlipidemia here for follow up.  She was initially seen in the hospital 10/2017 where she presented with chest pain.  She was diagnosed with hypertension and hyperlipidemia that admission.  She was started on hydrochlorothiazide.  Cardiac enzymes were negative and EKG was unremarkable.  Her chest pain was felt to be atypical and she was set up for outpatient stress testing.  She followed up with [name], DNP,  two weeks later.  She had not filled her medication as she is "not a medicine person."  She also rescheduled her testing.  She had an echo later that month that revealed LVEF 60 to 65% with grade 1 diastolic dysfunction.  She had a [employer] 11/2017 that revealed normal systolic function and no ischemia.  Since that time she was started on atorvastatin but did not tolerate it due to myalgias.  She followed up with our pharmacist and they recommended starting rosuvastatin 5 mg twice a week but she declined.  She wanted to work on diet and exercise.  At her last appointment Amber Huffman was doing well physically but was not exercising much due to COVID-19.  She was referred to the healthy weight and wellness clinic but hasn't been going.   Her PCP prescribed Zetia but she didn't get it. She is very afraid of having an adverse reaction after she tried a statin in the past.  At her last appointment and she had some abdominal discomfort evaluation due to her gallbladder and.  We are also struggling to manage her lipids.  She was referred for coronary CT-a that revealed no coronary artery disease and a calcium score  of 0.  She has been very sad lately because her sister passed from COVID-12.  Amber Huffman had COVID-19 the week before Christmas.  She is just now starting to feel better. She isn't checking her BP and hasn't been getting any exercise.  She has no LE edema, orthopnea or PND.    Past Medical History:  Diagnosis Date  . Allergy   . Anemia   . Anxiety   . Arthritis    Thumbs  . Blood transfusion without reported diagnosis   . Depression   . Diabetes mellitus without complication ([place])    type 2  . GERD (gastroesophageal reflux disease)   . Hyperlipidemia   . Hypertension   . Morbid obesity with BMI of 50.0-59.9, adult ([place])   . Osteoporosis   . Pneumonia   . Substance abuse ([place])    ETOH abuse, quit drinking in 1985   Past Surgical History:  Procedure Laterality Date  . ABDOMINAL HYSTERECTOMY    . CERVIX REMOVAL    . CESAREAN SECTION    . COLONOSCOPY    . OOPHORECTOMY    . UPPER GASTROINTESTINAL ENDOSCOPY    . WISDOM TOOTH EXTRACTION       Current Meds  Medication Sig  . acetaminophen (TYLENOL) 500 MG tablet Take 1,000 mg by mouth every 6 (six) hours as needed (shoulder pain.).  Marland Kitchen Blood Glucose Monitoring Suppl ([place]) w/Device KIT Check blood sugar TID  Dx: E11.9  .  ezetimibe (ZETIA) 10 MG tablet Take 1 tablet (10 mg total) by mouth daily.  Marland Kitchen glucose blood (ONETOUCH VERIO) test strip Check blood sugar TID  Dx: E11.9  . hydrochlorothiazide (HYDRODIURIL) 25 MG tablet Take 1 tablet (25 mg total) by mouth daily.  Marland Kitchen ibuprofen (ADVIL) 600 MG tablet Take 1 tablet (600 mg total) by mouth every 8 (eight) hours as needed.  . ivabradine (CORLANOR) 5 MG TABS tablet TAKE 1 TABLET 2 HOURS PRIOR TO CT  . Lancets (ONETOUCH DELICA PLUS [id number]) MISC Check blood sugar TID  Dx: E11.9  . meclizine (ANTIVERT) 12.5 MG tablet Take 1 tablet (12.5 mg total) by mouth 3 (three) times daily as needed for dizziness.  . metFORMIN (GLUCOPHAGE) 500 MG tablet Take 1 tablet (500 mg  total) by mouth 2 (two) times daily with a meal.  . metoprolol tartrate (LOPRESSOR) 100 MG tablet TAKE 1 TABLET 2 HOURS PRIOR TO CT  . potassium chloride (KLOR-CON) 10 MEQ tablet Take 1 tablet (10 mEq total) by mouth daily.  . Vitamin D, Ergocalciferol, (DRISDOL) 1.25 MG (50000 UNIT) CAPS capsule Take one pill every Thursday   Current Facility-Administered Medications for the 02/22/20 encounter (Office Visit) with Skeet Latch, MD  Medication  . 0.9 %  sodium chloride infusion     Allergies:   Penicillins   Social History   Tobacco Use  . Smoking status: Passive Smoke Exposure - Never Smoker  . Smokeless tobacco: Never Used  Vaping Use  . Vaping Use: Never used  Substance Use Topics  . 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  . Drug use: Not Currently     Family Hx: The patient's family history includes Heart disease in her father; Uterine cancer in her mother. There is no history of Colon cancer, Esophageal cancer, Rectal cancer, or Stomach cancer.  ROS:   Please see the history of present illness.    All other systems reviewed and are negative.   Prior CV studies:   The following studies were reviewed today:  Echo 11/12/17: Study Conclusions  - Left ventricle: The cavity size was normal. There was moderate concentric hypertrophy. Systolic function was normal. The estimated ejection fraction was in the range of 60% to 65%. Wall motion was normal; there were no regional wall motion abnormalities. There was an increased relative contribution of atrial contraction to ventricular filling. Doppler parameters are consistent with abnormal left ventricular relaxation (grade 1 diastolic dysfunction). - Mitral valve: Calcified annulus. There was trivial regurgitation.  Lexiscan Myoview 11/13/17:  Nuclear stress EF: 81%.  The left  ventricular ejection fraction is hyperdynamic (>65%).  There was no ST segment deviation noted during stress.  The study is normal.  This is a low risk study.  Normal resting and stress perfusion. No ischemia or infarction EF 81%  Coronary CT-A 12/21: IMPRESSION: 1. Coronary calcium score of 0. This was 0 percentile for age and sex matched control.  2. Normal coronary origin with right dominance.  3. No evidence of CAD.  Carotid Doppler 09/2019: IMPRESSION: 1. Bilateral carotid bifurcation plaque resulting in less than 50% diameter ICA stenosis. 2. Antegrade bilateral vertebral arterial flow. 3. Hypertension  Labs/Other Tests and Data Reviewed:    EKG:  An ECG dated 07/19/19 was personally reviewed today and demonstrated:  sinus rhythm.  Rate 84 bpm 12/14/2019: Sinus tachycardia.  Rate 104 bpm.  Right axis deviation.  Recent Labs: 02/23/2019: TSH 2.62 10/05/2019: Hemoglobin 15.1; Platelets 358 12/06/2019: ALT 20; BUN 19; Creatinine, Ser 0.65; Potassium 3.3; Sodium 134   Recent Lipid Panel Lab Results  Component Value Date/Time   CHOL 223 (H) 02/23/2019 01:15 PM   CHOL 213 (H) 02/18/2018 10:20 AM   TRIG 126.0 02/23/2019 01:15 PM   HDL 40.80 02/23/2019 01:15 PM   HDL 43 02/18/2018 10:20 AM   CHOLHDL 5 02/23/2019 01:15 PM   LDLCALC 157 (H) 02/23/2019 01:15 PM   LDLCALC 152 (H) 02/18/2018 10:20 AM    Wt Readings from Last 3 Encounters:  02/22/20 203 lb 6.4 oz (92.3 kg)  12/14/19 210 lb (95.3 kg)  12/06/19 208 lb (94.3 kg)     Objective:    VS:  BP 122/78   Pulse 73   Ht 4' 10.5" (1.486 m)   Wt 203 lb 6.4 oz (92.3 kg)   SpO2 99%   BMI 41.79 kg/m  , BMI Body mass index is 41.79 kg/m. GENERAL:  Well appearing HEENT: Pupils equal round and reactive, fundi not visualized, oral mucosa unremarkable NECK:  No jugular venous distention, waveform within normal limits, carotid upstroke brisk and symmetric, no bruits LUNGS:  Clear to auscultation bilaterally HEART:   RRR.  PMI not displaced or sustained,S1 and S2 within normal limits, no S3, no S4, no clicks, no rubs, no murmurs ABD:  Flat, positive bowel sounds normal in frequency in pitch, no bruits, no rebound, no guarding, no midline pulsatile mass, no hepatomegaly, no splenomegaly EXT:  2 plus pulses throughout, no edema, no cyanosis no clubbing SKIN:  No rashes no nodules NEURO:  Cranial nerves II through XII grossly intact, motor grossly intact throughout PSYCH:  Cognitively intact, oriented to person place and time  ASSESSMENT & PLAN:    # Atypical chest pain:  Resolved. Lexiscan Myoview negative 10/2017.  Had a difficult time convincing her to treat her lipids.  Coronary CT-A 12/2019 revealed a calcium score of 0 and no coronary disease.  # Hypertension: BP controlled on amlodipine.  # Morbid obesity: # Hyperlipidemia: Amber Huffman previously tried atorvastatin and did not tolerate it. We recommended low dose intermittent rosuvastatin and she refused.  Calcium score was 0.  She does still have carotid stenosis.  She is still against medications.  Recommended increasing exercise to 150 minutes and limiting fried foods, fatty foods, and cheese.  We will check a lipid panel and CMP today.  # ESPQZ-30:  She thinks she had COVID-19 infection and likely did.  She would like to get antibody testing to prove this.  # Prediabetes: Check hemoglobin A1c.   Medication Adjustments/Labs and Tests Ordered: Current medicines are reviewed at length with the patient today.  Concerns regarding medicines are outlined above.   Tests Ordered: Orders Placed This Encounter  Procedures  . SARS-CoV-2 Antibody, IgM  . Lipid panel  . Comprehensive metabolic panel  . HgB A1c    Medication Changes: No orders of the defined types were placed in this encounter.   Disposition:  Follow up in 1 year  Signed, Skeet Latch, MD  02/22/2020 12:34 PM    [place]

## 2020-02-22 NOTE — Patient Instructions (Addendum)
Medication Instructions:  Your physician recommends that you continue on your current medications as directed. Please refer to the Current Medication list given to you today.  *If you need a refill on your cardiac medications before your next appointment, please call your pharmacy*  Lab Work: LP/CMET/A1C/COVID ANTIBODY TODAY   If you have labs (blood work) drawn today and your tests are completely normal, you will receive your results only by: Marland Kitchen MyChart Message (if you have MyChart) OR . A paper copy in the mail If you have any lab test that is abnormal or we need to change your treatment, we will call you to review the results.  Testing/Procedures: NONE  Follow-Up: At [facility], you and your health needs are our priority.  As part of our continuing mission to provide you with exceptional heart care, we have created designated Provider Care Teams.  These Care Teams include your primary Cardiologist (physician) and Advanced Practice Providers (APPs -  Physician Assistants and Nurse Practitioners) who all work together to provide you with the care you need, when you need it.  We recommend signing up for the patient portal called "MyChart".  Sign up information is provided on this After Visit Summary.  MyChart is used to connect with patients for Virtual Visits (Telemedicine).  Patients are able to view lab/test results, encounter notes, upcoming appointments, etc.  Non-urgent messages can be sent to your provider as well.   To learn more about what you can do with MyChart, go to [email or web address].    Your next appointment:   12 month(s)  You will receive a reminder letter in the mail two months in advance. If you don't receive a letter, please call our office to schedule the follow-up appointment.  The format for your next appointment:   In Person  Provider:   You may see Skeet Latch, MD or one of the following Advanced Practice Providers on your designated Care Team:     Kerin Ransom, PA-C  [place], [place]  Coletta Memos, [place]

## 2020-02-23 LAB — COMPREHENSIVE METABOLIC PANEL
ALT: 13 IU/L (ref 0–32)
AST: 19 IU/L (ref 0–40)
Albumin/Globulin Ratio: 1.6 (ref 1.2–2.2)
Albumin: 4.4 g/dL (ref 3.7–4.7)
Alkaline Phosphatase: 90 IU/L (ref 44–121)
BUN/Creatinine Ratio: 23 (ref 12–28)
BUN: 17 mg/dL (ref 8–27)
Bilirubin Total: 0.4 mg/dL (ref 0.0–1.2)
CO2: 26 mmol/L (ref 20–29)
Calcium: 9.8 mg/dL (ref 8.7–10.3)
Chloride: 95 mmol/L — ABNORMAL LOW (ref 96–106)
Creatinine, Ser: 0.74 mg/dL (ref 0.57–1.00)
GFR calc Af Amer: 94 mL/min/{1.73_m2} (ref >59)
GFR calc non Af Amer: 81 mL/min/{1.73_m2} (ref >59)
Globulin, Total: 2.8 g/dL (ref 1.5–4.5)
Glucose: 106 mg/dL — ABNORMAL HIGH (ref 65–99)
Potassium: 4.1 mmol/L (ref 3.5–5.2)
Sodium: 138 mmol/L (ref 134–144)
Total Protein: 7.2 g/dL (ref 6.0–8.5)

## 2020-02-23 LAB — LIPID PANEL
Chol/HDL Ratio: 5.2 ratio — ABNORMAL HIGH (ref 0.0–4.4)
Cholesterol, Total: 238 mg/dL — ABNORMAL HIGH (ref 100–199)
HDL: 46 mg/dL (ref >39)
LDL Chol Calc (NIH): 175 mg/dL — ABNORMAL HIGH (ref 0–99)
Triglycerides: 97 mg/dL (ref 0–149)
VLDL Cholesterol Cal: 17 mg/dL (ref 5–40)

## 2020-02-23 LAB — SARS-COV-2 ANTIBODY, IGM: SARS-CoV-2 Antibody, IgM: POSITIVE

## 2020-02-23 LAB — HEMOGLOBIN A1C
Est. average glucose Bld gHb Est-mCnc: 137 mg/dL
Hgb A1c MFr Bld: 6.4 % — ABNORMAL HIGH (ref 4.8–5.6)

## 2020-02-27 ENCOUNTER — Telehealth: Payer: Self-pay | Admitting: *Deleted

## 2020-02-27 DIAGNOSIS — I10 Essential (primary) hypertension: Secondary | ICD-10-CM

## 2020-02-27 DIAGNOSIS — E7800 Pure hypercholesterolemia, unspecified: Secondary | ICD-10-CM

## 2020-02-27 NOTE — Telephone Encounter (Signed)
-----   Message from Skeet Latch, MD sent at 02/27/2020  1:20 PM EST ----- Labs show that she did have COVID-19 previously.  Cholesterol numbers are going in the wrong direction.  .  Kidney function, liver function, and electrolytes are all normal.  Hemoglobin A1c is consistent with prediabetes.  I know she does not want medication for her cholesterol.  Highly recommend that she start exercising 150 minutes and limit fried foods, fatty foods, red meat, and cheese.

## 2020-02-27 NOTE — Telephone Encounter (Signed)
Overall it should be safe to try.  She needs to be aware that it can increase risk of sun sensitivity, as can her HCTZ, so be careful when out in the sun.  Also it can decrease blood sugar, so she will need to monitor that.   Would recommend that she try for 3 months then repeat lipid labs to see if any benefit.  She should also work on dietary issues mentioned by Dr. Oval Linsey

## 2020-02-27 NOTE — Telephone Encounter (Signed)
Advised patient, verbalized understanding  

## 2020-02-27 NOTE — Telephone Encounter (Signed)
Advised patient of lab results  Patient would like to know if ok to take Citrus [place], read it was good for lowering her LDL  Will forward to Pharm D and Dr Oval Linsey for review

## 2020-03-08 ENCOUNTER — Telehealth: Payer: Self-pay | Admitting: Medical

## 2020-03-08 MED ORDER — HYDROCHLOROTHIAZIDE 25 MG PO TABS: 25.0000 mg | ORAL_TABLET | Freq: Every day | ORAL | 0 refills | Status: DC

## 2020-03-08 NOTE — Telephone Encounter (Signed)
Rx sent 

## 2020-03-08 NOTE — Telephone Encounter (Signed)
Caller : Amber Huffman  Call Back @ [phone]  Patient states she has lost her medication and would like a refill of hydrochlorothiazide (HYDRODIURIL) 25 MG tablet [[id number]].  Patient states she is out of medication as of last night.   Please advise

## 2020-03-30 ENCOUNTER — Other Ambulatory Visit: Payer: Self-pay | Admitting: Medical

## 2020-04-17 ENCOUNTER — Encounter: Payer: Self-pay | Admitting: Family Medicine

## 2020-04-17 ENCOUNTER — Telehealth (INDEPENDENT_AMBULATORY_CARE_PROVIDER_SITE_OTHER): Payer: Medicare Other | Admitting: Family Medicine

## 2020-04-17 ENCOUNTER — Other Ambulatory Visit: Payer: Self-pay

## 2020-04-17 DIAGNOSIS — Z634 Disappearance and death of family member: Secondary | ICD-10-CM

## 2020-04-17 MED ORDER — ALPRAZOLAM 0.5 MG PO TABS: 0.2500 mg | ORAL_TABLET | Freq: Three times a day (TID) | ORAL | 0 refills | Status: DC | PRN

## 2020-04-17 NOTE — Progress Notes (Signed)
Chief Complaint  Patient presents with  . Anxiety    Son is in severe pain and she is having a really difficult time.    Subjective: Patient is a 70 y.o. female here for anxiety. Due to COVID-19 pandemic, we are interacting via telephone. I verified patient's ID using 2 identifiers. Patient agreed to proceed with visit via this method. Patient is at home, I am at office. Patient and I are present for visit.   Pt's son has been dx'd w metastatic cancer. He is in a lot of pain. Cancer metastasized to his brain. He is not acting his normal self either.  She is requesting something for anxiety to take as needed.  She does not wish to take a daily medicine.  She is to be on Prozac for depression.  No homicidal or suicidal ideation.  Past Medical History:  Diagnosis Date  . Allergy   . Anemia   . Anxiety   . Arthritis    Thumbs  . Blood transfusion without reported diagnosis   . Depression   . Diabetes mellitus without complication ([place])    type 2  . GERD (gastroesophageal reflux disease)   . Hyperlipidemia   . Hypertension   . Morbid obesity with BMI of 50.0-59.9, adult ([place])   . Osteoporosis   . Pneumonia   . Substance abuse ([place])    ETOH abuse, quit drinking in 1985    Objective: No conversational dyspnea Age appropriate judgment and insight Nml affect and mood  Assessment and Plan: Bereavement - Plan: ALPRAZolam (XANAX) 0.5 MG tablet  Xanax as needed.  I would like her to follow-up in around 10 days to see how she is doing. Total time: 13 minutes The patient voiced understanding and agreement to the plan.  [place], DO 04/17/20  11:44 AM

## 2020-04-18 ENCOUNTER — Other Ambulatory Visit: Payer: Self-pay | Admitting: Medical

## 2020-05-23 ENCOUNTER — Telehealth: Payer: Self-pay

## 2020-05-23 DIAGNOSIS — Z006 Encounter for examination for normal comparison and control in clinical research program: Secondary | ICD-10-CM

## 2020-05-23 NOTE — Telephone Encounter (Signed)
I have attempted without success to contact this patient by phone for her Identify 90 day follow up phone call. I left a message for patient to return my phone call with my name and callback number. I was unable to send an e-mail. Patient does not e-mail on file.

## 2020-06-05 ENCOUNTER — Other Ambulatory Visit: Payer: Self-pay | Admitting: Medical

## 2020-07-20 ENCOUNTER — Other Ambulatory Visit: Payer: Self-pay | Admitting: Medical

## 2020-07-24 ENCOUNTER — Other Ambulatory Visit: Payer: Self-pay | Admitting: Medical

## 2020-08-14 ENCOUNTER — Ambulatory Visit (HOSPITAL_BASED_OUTPATIENT_CLINIC_OR_DEPARTMENT_OTHER)
Admission: RE | Admit: 2020-08-14 | Discharge: 2020-08-14 | Disposition: A | Discharge status: Home / Self Care | Payer: Medicare Other | Source: Ambulatory Visit | Attending: Medical | Admitting: Medical

## 2020-08-14 ENCOUNTER — Other Ambulatory Visit: Payer: Self-pay

## 2020-08-14 ENCOUNTER — Ambulatory Visit (INDEPENDENT_AMBULATORY_CARE_PROVIDER_SITE_OTHER): Payer: Medicare Other | Admitting: Medical

## 2020-08-14 VITALS — BP 136/77 | HR 98 | Temp 98.3°F | Resp 20 | Ht <= 58 in | Wt 217.8 lb

## 2020-08-14 DIAGNOSIS — R600 Localized edema: Secondary | ICD-10-CM | POA: Diagnosis not present

## 2020-08-14 DIAGNOSIS — R0600 Dyspnea, unspecified: Secondary | ICD-10-CM | POA: Diagnosis not present

## 2020-08-14 DIAGNOSIS — R059 Cough, unspecified: Secondary | ICD-10-CM | POA: Diagnosis not present

## 2020-08-14 DIAGNOSIS — K219 Gastro-esophageal reflux disease without esophagitis: Secondary | ICD-10-CM | POA: Diagnosis not present

## 2020-08-14 DIAGNOSIS — E119 Type 2 diabetes mellitus without complications: Secondary | ICD-10-CM | POA: Diagnosis not present

## 2020-08-14 DIAGNOSIS — Z634 Disappearance and death of family member: Secondary | ICD-10-CM

## 2020-08-14 MED ORDER — NEOMYCIN-POLYMYXIN-HC 1 % OT SOLN: 3.0000 [drp] | Freq: Three times a day (TID) | OTIC | 0 refills | Status: DC

## 2020-08-14 NOTE — Progress Notes (Signed)
Subjective:    Patient ID: Amber Huffman, female    DOB: 1950-09-11, 70 y.o.   MRN: [id number]  HPI Pt in for follow up.  Pt states over past 2 weeks she had recent cough, st and mild lower ext swelling. Symptoms improved but residual now.  Rt ear discomfort recently.   Pt thinks maybe swelling in her feet.  In the past with cough she thought maybe she had some reflux. In the past I had written omeprazole but she never took.  Pt has been sad recently since death of her son around 2022-04-25. Pt does not want to be on medication for depressed mood. She cries about her sons death at 48 years old. He passed away within 6 weeks period. Pt got xanax called in shortly after son death. She only used 3 tab.   Pt A1-C 5 month ago was 6.4. since then she admits no having healthy diet. Eating more and not exercising since death of her son.  Hx of high cholesterol. Pt declined to be on med.     Review of Systems  Constitutional:  Negative for chills, fatigue and fever.  HENT:  Positive for ear pain. Negative for congestion, hearing loss, postnasal drip, sinus pressure and sinus pain.   Respiratory:  Positive for cough and shortness of breath. Negative for chest tightness and wheezing.        See hpi.   Cardiovascular:  Negative for chest pain and palpitations.  Gastrointestinal:  Negative for abdominal pain, constipation and nausea.  Genitourinary:  Negative for dyspareunia, dysuria, flank pain and frequency.  Musculoskeletal:  Negative for arthralgias, back pain and joint swelling.  Skin:  Negative for rash.  Neurological:  Negative for dizziness, light-headedness and headaches.  Hematological:  Negative for adenopathy. Does not bruise/bleed easily.    Past Medical History:  Diagnosis Date   Allergy    Anemia    Anxiety    Arthritis    Thumbs   Blood transfusion without reported diagnosis    Depression    Diabetes mellitus without complication ([place])    type 2   GERD  (gastroesophageal reflux disease)    Hyperlipidemia    Hypertension    Morbid obesity with BMI of 50.0-59.9, adult ([facility])    Osteoporosis    Pneumonia    Substance abuse ([place])    ETOH abuse, quit drinking in 1985     Social History   Socioeconomic History   Marital status: Married    Spouse name: Not on file   Number of children: Not on file   Years of education: Not on file   Highest education level: Not on file  Occupational History   Not on file  Tobacco Use   Smoking status: Passive Smoke Exposure - Never Smoker   Smokeless tobacco: Never  Vaping Use   Vaping Use: Never used  Substance and Sexual Activity   Alcohol use: Not Currently    Comment: 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   Drug use: Not Currently   Sexual activity: Not on file    Comment: Hysterectomy  Other Topics Concern   Not on file  Social History Narrative   Not on file   Social Determinants of Health   Financial Resource Strain: Not on file  Food Insecurity: Not on file  Transportation Needs: Not on file  Physical Activity: Not on file  Stress: Not  on file  Social Connections: Not on file  Intimate Partner Violence: Not on file    Past Surgical History:  Procedure Laterality Date   ABDOMINAL HYSTERECTOMY     CERVIX REMOVAL     CESAREAN SECTION     COLONOSCOPY     OOPHORECTOMY     UPPER GASTROINTESTINAL ENDOSCOPY     WISDOM TOOTH EXTRACTION      Family History  Problem Relation Age of Onset   Heart disease Father        After the age of 43   Uterine cancer Mother    Colon cancer Neg Hx    Esophageal cancer Neg Hx    Rectal cancer Neg Hx    Stomach cancer Neg Hx     Allergies  Allergen Reactions   Penicillins Rash    Has patient had a PCN reaction causing immediate rash, facial/tongue/throat swelling, SOB or lightheadedness with hypotension: Yes Has patient had a PCN reaction causing severe  rash involving mucus membranes or skin necrosis: No Has patient had a PCN reaction that required hospitalization: No Has patient had a PCN reaction occurring within the last 10 years: No If all of the above answers are "NO", then may proceed with Cephalosporin use.    Current Outpatient Medications on File Prior to Visit  Medication Sig Dispense Refill   acetaminophen (TYLENOL) 500 MG tablet Take 1,000 mg by mouth every 6 (six) hours as needed (shoulder pain.).     ALPRAZolam (XANAX) 0.5 MG tablet Take 0.5-1 tablets (0.25-0.5 mg total) by mouth 3 (three) times daily as needed for anxiety. 30 tablet 0   Blood Glucose Monitoring Suppl (ONETOUCH VERIO FLEX SYSTEM) w/Device KIT Check blood sugar TID  Dx: E11.9 1 kit 0   glucose blood (ONETOUCH VERIO) test strip Check blood sugar TID  Dx: E11.9 300 each 2   hydrochlorothiazide (HYDRODIURIL) 25 MG tablet Take 1 tablet by mouth once daily 90 tablet 0   ibuprofen (ADVIL) 600 MG tablet TAKE 1 TABLET BY MOUTH EVERY 8 HOURS AS NEEDED 30 tablet 0   Lancets (ONETOUCH DELICA PLUS [id number]) MISC Check blood sugar TID  Dx: E11.9 300 each 2   potassium chloride (KLOR-CON) 10 MEQ tablet Take 1 tablet (10 mEq total) by mouth daily. 4 tablet 0   Vitamin D, Ergocalciferol, (DRISDOL) 1.25 MG (50000 UNIT) CAPS capsule Take one pill every Thursday 12 capsule 0   No current facility-administered medications on file prior to visit.    BP 136/77   Pulse 98   Temp 98.3 F (36.8 C)   Resp 20   Ht $R'4\' 10"'LF$  (1.473 m)   Wt 217 lb 12.8 oz (98.8 kg)   SpO2 93%   BMI 45.52 kg/m       Objective:   Physical Exam  General Mental Status- Alert. General Appearance- Not in acute distress.   Skin General: Color- Normal Color. Moisture- Normal Moisture.  Neck Carotid Arteries- Normal color. Moisture- Normal Moisture. No carotid bruits. No JVD.  Chest and Lung Exam Auscultation: Breath Sounds:-Normal.  Cardiovascular Auscultation:Rythm- Regular. Murmurs &  Other Heart Sounds:Auscultation of the heart reveals- No Murmurs.  Abdomen Inspection:-Inspeection Normal. Palpation/Percussion:Note:No mass. Palpation and Percussion of the abdomen reveal- Non Tender, Non Distended + BS, no rebound or guarding.   Neurologic Cranial Nerve exam:- CN III-XII intact(No nystagmus), symmetric smile. Strength:- 5/5 equal and symmetric strength both upper and lower extremities.   Lower ext- faint 1+ pedal edema at best bilaterally symmetric. Negative homans signs.  Heent- no frontal or maxillary sinus pressure. Rt ear canal bottom portion of canal looks inflamed. Tm looks normal.    Assessment & Plan:   Over the past 2 weeks some intermittent dry cough, mild pedal edema and some intermittent dyspnea on exertion.  Will get CMP, BNP and chest x-ray today.  History of occasional cough in the past and we discussed differential diagnosis in past.  Considered possible reflux.  So if you know correlation of dry cough with eating you can use omeprazole or famotidine over-the-counter.  Also with your heavy exposure to secondhand smoke in the past you could use albuterol on a trial basis to see if that impacts cough.  Recent bereavement since son passed.  Recommend continue her church attendance, reading yourbBible and if needed use of Xanax.  History of diabetes but your A1c most recently was 6.4.  We will get repeat A1c today to see if average sugar worse?  Your right ear canal looks portion.  Probable Otitis Externa.  Prescribed Cortisporin drops.  HEENT-  Follow-up date to be determined after lab review.  Mackie Pai, PA-C

## 2020-08-14 NOTE — Patient Instructions (Addendum)
Over the past 2 weeks some intermittent dry cough, mild pedal edema and some intermittent dyspnea on exertion.  Will get CMP, BNP and chest x-ray today.  History of occasional cough in the past and we discussed differential diagnosis in past.  Considered possible reflux.  So if you know correlation of dry cough with eating you can use omeprazole or famotidine over-the-counter.  Also with your heavy exposure to secondhand smoke in the past you could use albuterol on a trial basis to see if that impacts cough.  Recent bereavement since son passed.  Recommend continue her church attendance, reading your bible and if needed use of Xanax.  History of diabetes but your A1c most recently was 6.4.  We will get repeat A1c today to see if average sugar worse?  Your right ear canal looks portion.  Probable Otitis Externa.  Prescribed Cortisporin drops.  HEENT-  Follow-up date to be determined after lab review.

## 2020-08-15 ENCOUNTER — Emergency Department (HOSPITAL_BASED_OUTPATIENT_CLINIC_OR_DEPARTMENT_OTHER): Payer: Medicare Other

## 2020-08-15 ENCOUNTER — Emergency Department (HOSPITAL_BASED_OUTPATIENT_CLINIC_OR_DEPARTMENT_OTHER)
Admission: EM | Admit: 2020-08-15 | Discharge: 2020-08-15 | Disposition: A | Discharge status: Home / Self Care | Payer: Medicare Other | Attending: Emergency Medicine | Admitting: Emergency Medicine

## 2020-08-15 ENCOUNTER — Encounter (HOSPITAL_BASED_OUTPATIENT_CLINIC_OR_DEPARTMENT_OTHER): Payer: Self-pay | Admitting: Emergency Medicine

## 2020-08-15 DIAGNOSIS — Z7722 Contact with and (suspected) exposure to environmental tobacco smoke (acute) (chronic): Secondary | ICD-10-CM | POA: Diagnosis not present

## 2020-08-15 DIAGNOSIS — E1169 Type 2 diabetes mellitus with other specified complication: Secondary | ICD-10-CM | POA: Insufficient documentation

## 2020-08-15 DIAGNOSIS — I10 Essential (primary) hypertension: Secondary | ICD-10-CM | POA: Diagnosis not present

## 2020-08-15 DIAGNOSIS — R1013 Epigastric pain: Secondary | ICD-10-CM | POA: Insufficient documentation

## 2020-08-15 DIAGNOSIS — Z79899 Other long term (current) drug therapy: Secondary | ICD-10-CM | POA: Diagnosis not present

## 2020-08-15 DIAGNOSIS — Z20822 Contact with and (suspected) exposure to covid-19: Secondary | ICD-10-CM | POA: Diagnosis not present

## 2020-08-15 DIAGNOSIS — R112 Nausea with vomiting, unspecified: Secondary | ICD-10-CM | POA: Insufficient documentation

## 2020-08-15 DIAGNOSIS — R0789 Other chest pain: Secondary | ICD-10-CM | POA: Diagnosis not present

## 2020-08-15 DIAGNOSIS — R197 Diarrhea, unspecified: Secondary | ICD-10-CM | POA: Diagnosis not present

## 2020-08-15 DIAGNOSIS — E785 Hyperlipidemia, unspecified: Secondary | ICD-10-CM | POA: Diagnosis not present

## 2020-08-15 DIAGNOSIS — K8020 Calculus of gallbladder without cholecystitis without obstruction: Secondary | ICD-10-CM

## 2020-08-15 LAB — COMPREHENSIVE METABOLIC PANEL
ALT: 10 U/L (ref 0–35)
ALT: 11 U/L (ref 0–44)
AST: 14 U/L (ref 0–37)
AST: 22 U/L (ref 15–41)
Albumin: 3.9 g/dL (ref 3.5–5.2)
Albumin: 3.6 g/dL (ref 3.5–5.0)
Alkaline Phosphatase: 83 U/L (ref 39–117)
Alkaline Phosphatase: 75 U/L (ref 38–126)
Anion gap: 9 (ref 5–15)
BUN: 16 mg/dL (ref 6–23)
BUN: 18 mg/dL (ref 8–23)
CO2: 34 mEq/L — ABNORMAL HIGH (ref 19–32)
CO2: 30 mmol/L (ref 22–32)
Calcium: 9.1 mg/dL (ref 8.4–10.5)
Calcium: 7.6 mg/dL — ABNORMAL LOW (ref 8.9–10.3)
Chloride: 97 mEq/L (ref 96–112)
Chloride: 100 mmol/L (ref 98–111)
Creatinine, Ser: 0.65 mg/dL (ref 0.40–1.20)
Creatinine, Ser: 0.64 mg/dL (ref 0.44–1.00)
GFR, Estimated: >60 mL/min (ref >60)
GFR: 87.87 mL/min (ref >60.00)
Glucose, Bld: 98 mg/dL (ref 70–99)
Glucose, Bld: 169 mg/dL — ABNORMAL HIGH (ref 70–99)
Potassium: 3.6 mEq/L (ref 3.5–5.1)
Potassium: 3.8 mmol/L (ref 3.5–5.1)
Sodium: 139 mEq/L (ref 135–145)
Sodium: 139 mmol/L (ref 135–145)
Total Bilirubin: 0.4 mg/dL (ref 0.2–1.2)
Total Bilirubin: 0.8 mg/dL (ref 0.3–1.2)
Total Protein: 6.8 g/dL (ref 6.0–8.3)
Total Protein: 6.7 g/dL (ref 6.5–8.1)

## 2020-08-15 LAB — CBC WITH DIFFERENTIAL/PLATELET
Abs Immature Granulocytes: 0.19 10*3/uL — ABNORMAL HIGH (ref 0.00–0.07)
Basophils Absolute: 0.2 10*3/uL — ABNORMAL HIGH (ref 0.0–0.1)
Basophils Relative: 1 %
Eosinophils Absolute: 0.3 10*3/uL (ref 0.0–0.5)
Eosinophils Relative: 1 %
HCT: 48.3 % — ABNORMAL HIGH (ref 36.0–46.0)
Hemoglobin: 15.6 g/dL — ABNORMAL HIGH (ref 12.0–15.0)
Immature Granulocytes: 1 %
Lymphocytes Relative: 8 %
Lymphs Abs: 1.9 10*3/uL (ref 0.7–4.0)
MCH: 27.7 pg (ref 26.0–34.0)
MCHC: 32.3 g/dL (ref 30.0–36.0)
MCV: 85.8 fL (ref 80.0–100.0)
Monocytes Absolute: 1.1 10*3/uL — ABNORMAL HIGH (ref 0.1–1.0)
Monocytes Relative: 4 %
Neutro Abs: 21.4 10*3/uL — ABNORMAL HIGH (ref 1.7–7.7)
Neutrophils Relative %: 85 %
Platelets: 328 10*3/uL (ref 150–400)
RBC: 5.63 MIL/uL — ABNORMAL HIGH (ref 3.87–5.11)
RDW: 14.9 % (ref 11.5–15.5)
WBC: 25 10*3/uL — ABNORMAL HIGH (ref 4.0–10.5)
nRBC: 0 % (ref 0.0–0.2)

## 2020-08-15 LAB — CBC
HCT: 44.7 % (ref 36.0–46.0)
Hemoglobin: 14.6 g/dL (ref 12.0–15.0)
MCH: 27.8 pg (ref 26.0–34.0)
MCHC: 32.7 g/dL (ref 30.0–36.0)
MCV: 85 fL (ref 80.0–100.0)
Platelets: 269 10*3/uL (ref 150–400)
RBC: 5.26 MIL/uL — ABNORMAL HIGH (ref 3.87–5.11)
RDW: 14.8 % (ref 11.5–15.5)
WBC: 16.1 10*3/uL — ABNORMAL HIGH (ref 4.0–10.5)
nRBC: 0 % (ref 0.0–0.2)

## 2020-08-15 LAB — URINALYSIS, ROUTINE W REFLEX MICROSCOPIC
Bilirubin Urine: NEGATIVE
Glucose, UA: NEGATIVE mg/dL
Hgb urine dipstick: NEGATIVE
Ketones, ur: NEGATIVE mg/dL
Leukocytes,Ua: NEGATIVE
Nitrite: NEGATIVE
Protein, ur: NEGATIVE mg/dL
Specific Gravity, Urine: <1.005 — ABNORMAL LOW (ref 1.005–1.030)
pH: 6.5 (ref 5.0–8.0)

## 2020-08-15 LAB — LIPASE, BLOOD: Lipase: 24 U/L (ref 11–51)

## 2020-08-15 LAB — RESP PANEL BY RT-PCR (FLU A&B, COVID) ARPGX2
Influenza A by PCR: NEGATIVE
Influenza B by PCR: NEGATIVE
SARS Coronavirus 2 by RT PCR: NEGATIVE

## 2020-08-15 LAB — BRAIN NATRIURETIC PEPTIDE: Pro B Natriuretic peptide (BNP): 32 pg/mL (ref 0.0–100.0)

## 2020-08-15 LAB — HEMOGLOBIN A1C: Hgb A1c MFr Bld: 6.4 % (ref 4.6–6.5)

## 2020-08-15 LAB — TROPONIN I (HIGH SENSITIVITY)
Troponin I (High Sensitivity): 4 ng/L (ref <18)
Troponin I (High Sensitivity): 5 ng/L (ref <18)

## 2020-08-15 MED ORDER — KETOROLAC TROMETHAMINE 30 MG/ML IJ SOLN
30.0000 mg | Freq: Once | INTRAMUSCULAR | Status: AC
  Administered 2020-08-15: 30 mg via INTRAVENOUS
  Filled 2020-08-15: qty 1

## 2020-08-15 MED ORDER — IOHEXOL 300 MG/ML  SOLN
100.0000 mL | Freq: Once | INTRAMUSCULAR | Status: AC | PRN
  Administered 2020-08-15: 100 mL via INTRAVENOUS

## 2020-08-15 MED ORDER — SODIUM CHLORIDE 0.9 % IV BOLUS
1000.0000 mL | Freq: Once | INTRAVENOUS | Status: AC
  Administered 2020-08-15: 1000 mL via INTRAVENOUS

## 2020-08-15 NOTE — ED Notes (Signed)
Unable to draw blood from PIV, but flushes well; straight stick per EDP

## 2020-08-15 NOTE — ED Triage Notes (Signed)
Pt brought in by EMS from home  Pt states about an hour and a half ago she woke up with N/V/D and mild chest wall discomfort  Pt was pale and diaphoretic upon EMS arrival   Pt took aspirin '325mg'$  at home prior to EMS arrival  VS stable  Pt was given Zofran 4 mg IV and a bolus of NS 500 ml by EMS

## 2020-08-15 NOTE — ED Notes (Signed)
Pt given PO challenge per EDP

## 2020-08-15 NOTE — ED Provider Notes (Signed)
Patient signed out to me by overnight provider.  Briefly this is a 70 year old female with a history of obesity, hypertension, significant reflux, prediabetic, presenting to emergency department with epigastric pain, chest discomfort, diaphoresis.  She reports the symptoms woke her up from sleep last night.  She cannot quantify or qualify her pain, reports was a discomfort directly over her epigastrium.  EMS gave her aspirin and brought her to the ED.  She subsequently received a bolus of fluids as well as Zofran.  She reports dramatic improvement of her symptoms since then and feels back to her baseline.  She did report loose bowel movements last night as well.  The overnight provider check blood test, which were notable for leukocytosis of 25K.  Otherwise her CMP was unremarkable, UA showed no sign of infection.  X-ray of the chest showed no focal infiltrate.  Lipase was unremarkable.  The patient denies any recent cough.  She has had pneumonia in the past but says this does not feel like her pneumonia.  She is not currently on steroids.  Her delta troponins were flat and negative.  Her EKG showed a normal sinus rhythm with no acute ischemic changes.  CT scan of the abdomen pelvis with contrast showed gallstones, no acute inflammatory findings otherwise.  On my exam she is clinically well-appearing.  Her vitals are within normal limits.  She is afebrile.  She has an obese abdomen, no focal tenderness.  Given her leukocytosis, do think it is reasonable to pursue further investigation into infectious causes.  A gallbladder study as well as an x-ray ordered.  We will recheck her white blood cell count and 9 AM after she has received some fluids.  It is possible she had a gastro enteritis, with reactive leukocytosis.  Clinical Course as of 08/15/20 1103  Wed Aug 15, 2020  1013 WBC improved now 16.1. [MT]  1013 Discussed with the patient the possibility that her pain and symptoms are related to her  gallbladder.  She is adamant that she would not want gallbladder surgery at this time.  I do not see obvious signs of inflammation, nor sepsis or shock.  She prefers to go home.  Her blood pressures been stable (the isolated hypotensive episode was an erroneous reading). [MT]  1020 Pt sleeping O2 to 90%, BP soft, upon awakening O2 back to 95%, BP now 123456 systolic.   [MT]  1021 This time with her white blood cell count improving, the patient feeling back to baseline, she wants to go home.  I think this is reasonable.  I explained to her that if her blood cultures positive, she will be contacted and needs to come back to the ER.  Likewise, if she has another episode of abdominal pain, lightheadedness, or any other concerning symptoms, she should return to the emergency department. [MT]    Clinical Course User Index [MT] Aadit Hagood, Carola Rhine, MD      Wyvonnia Dusky, MD 08/15/20 [phone]

## 2020-08-15 NOTE — ED Provider Notes (Signed)
[facility] EMERGENCY DEPARTMENT Provider Note   CSN: [id number] Arrival date & time: 08/15/20  0418     History Chief Complaint  Patient presents with   Emesis    Kerington Hildebrant is a 70 y.o. female.  Patient is a 70 year old female with past medical history of hypertension, hyperlipidemia, GERD.  Patient presenting with complaints of epigastric and chest discomfort which woke her from sleep.  She reports feeling very nauseated, then had several episodes of vomiting and diarrhea.  All of these were nonbloody.  She denies any fevers or chills.  She denies any ill contacts or having consumed any suspicious foods.  She was given Zofran by EMS with some relief.  The history is provided by the patient.  Emesis Severity:  Moderate Duration:  2 hours Timing:  Constant Quality:  Stomach contents Progression:  Worsening Chronicity:  New Relieved by:  Antiemetics     Past Medical History:  Diagnosis Date   Allergy    Anemia    Anxiety    Arthritis    Thumbs   Blood transfusion without reported diagnosis    Depression    Diabetes mellitus without complication ([place])    type 2   GERD (gastroesophageal reflux disease)    Hyperlipidemia    Hypertension    Morbid obesity with BMI of 50.0-59.9, adult ([place])    Osteoporosis    Pneumonia    Substance abuse ([place])    ETOH abuse, quit drinking in 1985    Patient Active Problem List   Diagnosis Date Noted   Primary osteoarthritis of left knee 10/13/2019   Acute bilateral low back pain without sciatica 10/13/2019   Impingement syndrome of right shoulder 10/05/2019   Cervical radiculopathy 09/27/2019   Essential hypertension    Pure hypercholesterolemia    Chest pain 10/24/2017   Morbid obesity ([place]) 10/24/2017   Passive smoke exposure 10/24/2017   Sinus congestion 10/24/2017    Past Surgical History:  Procedure Laterality Date   ABDOMINAL HYSTERECTOMY     CERVIX REMOVAL     CESAREAN SECTION     COLONOSCOPY      OOPHORECTOMY     UPPER GASTROINTESTINAL ENDOSCOPY     WISDOM TOOTH EXTRACTION       OB History   No obstetric history on file.     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   Drug use: Not Currently    Home Medications Prior to Admission medications   Medication Sig Start Date End Date Taking? Authorizing Provider  acetaminophen (TYLENOL) 500 MG tablet Take 1,000 mg by mouth every 6 (six) hours as needed (shoulder pain.).    [provider]  ALPRAZolam Duanne Moron) 0.5 MG tablet Take 0.5-1 tablets (0.25-0.5 mg total) by mouth 3 (three) times daily as needed for  anxiety. 04/17/20   Shelda Pal, DO  Blood Glucose Monitoring Suppl ([place]) w/Device KIT Check blood sugar TID  Dx: E11.9 10/10/19   Saguier, Percell Rasnic, PA-C  glucose blood [facility] VERIO) test strip Check blood sugar TID  Dx: E11.9 10/10/19   Saguier, Percell Zeleznik, PA-C  hydrochlorothiazide (HYDRODIURIL) 25 MG tablet Take 1 tablet by mouth once daily 07/23/20   Saguier, Percell Rezendes, PA-C  ibuprofen (ADVIL) 600 MG tablet TAKE 1 TABLET BY MOUTH EVERY 8 HOURS AS NEEDED 07/25/20   Saguier, Percell Raburn, PA-C  Lancets [facility] DELICA PLUS [id number]) MISC Check blood sugar TID  Dx: E11.9 10/10/19   Saguier, Percell Delsignore, PA-C  NEOMYCIN-POLYMYXIN-HYDROCORTISONE (CORTISPORIN) 1 % SOLN OTIC solution Place 3 drops into the right ear every 8 (eight) hours. 08/14/20   Saguier, Percell Alligood, PA-C  potassium chloride  (KLOR-CON) 10 MEQ tablet Take 1 tablet (10 mEq total) by mouth daily. 12/07/19   Saguier, Percell Surrette, PA-C  Vitamin D, Ergocalciferol, (DRISDOL) 1.25 MG (50000 UNIT) CAPS capsule Take one pill every Thursday 02/13/20   Saguier, Percell Rahming, PA-C    Allergies    Penicillins  Review of Systems   Review of Systems  Gastrointestinal:  Positive for vomiting.  All other systems reviewed and are negative.  Physical Exam Updated Vital Signs BP 123/67 (BP Location: Right Arm)   Pulse 83   Temp 98.1 F (36.7 C) (Oral)   Resp 20   Ht 4' 10.5" (1.486 m)   Wt 98.4 kg   SpO2 91%   BMI 44.58 kg/m   Physical Exam Vitals and nursing note reviewed.  Constitutional:      General: She is not in acute distress.    Appearance: She is well-developed. She is not diaphoretic.  HENT:     Head: Normocephalic and atraumatic.  Cardiovascular:     Rate and Rhythm: Normal rate and regular rhythm.     Heart sounds: No murmur heard.   No friction rub. No gallop.  Pulmonary:     Effort: Pulmonary effort is normal. No respiratory distress.     Breath sounds: Normal breath sounds. No wheezing.  Abdominal:     General: Bowel sounds are normal. There is no distension.     Palpations: Abdomen is soft.     Tenderness: There is no abdominal tenderness.  Musculoskeletal:        General: Normal range of motion.     Cervical back: Normal range of motion and neck supple.  Skin:    General: Skin is warm and dry.  Neurological:     General: No focal deficit present.     Mental Status: She is alert and oriented to person, place, and time.    ED Results / Procedures / Treatments   Labs (all labs ordered are listed, but only abnormal results are displayed) Labs Reviewed  COMPREHENSIVE METABOLIC PANEL  LIPASE, BLOOD  CBC WITH DIFFERENTIAL/PLATELET  TROPONIN I (HIGH SENSITIVITY)    EKG EKG Interpretation  Date/Time:  Wednesday August 15 2020 04:37:59 EDT Ventricular Rate:  83 PR Interval:  161 QRS  Duration: 75 QT Interval:  391 QTC Calculation: 460 R Axis:   -37 Text Interpretation: Sinus rhythm Biatrial enlargement Left axis deviation No significant change since 07/19/2019 Confirmed by Veryl Speak [phone]) on 08/15/2020 6:06:51 AM  Radiology DG Chest 2 View  Result Date: 08/14/2020 CLINICAL DATA:  Cough.  Occasional dyspnea EXAM: CHEST - 2 VIEW COMPARISON:  02/23/2019 FINDINGS: Midline trachea. Normal heart size. Atherosclerosis in the transverse aorta. No  pleural effusion or pneumothorax. Mild pulmonary interstitial thickening. No lobar consolidation. Mild right hemidiaphragm elevation. IMPRESSION: No acute cardiopulmonary disease. Peribronchial thickening which may relate to chronic bronchitis or smoking. Electronically Signed   By: Abigail Miyamoto M.D.   On: 08/14/2020 17:41    Procedures Procedures   Medications Ordered in ED Medications  sodium chloride 0.9 % bolus 1,000 mL (has no administration in time range)  ketorolac (TORADOL) 30 MG/ML injection 30 mg (has no administration in time range)    ED Course  I have reviewed the triage vital signs and the nursing notes.  Pertinent labs & imaging results that were available during my care of the patient were reviewed by me and considered in my medical decision making (see chart for details).  Clinical Course as of 08/16/20 0458  Wed Aug 15, 2020  1013 WBC improved now 16.1. [MT]  1013 Discussed with the patient the possibility that her pain and symptoms are related to her gallbladder.  She is adamant that she would not want gallbladder surgery at this time.  I do not see obvious signs of inflammation, nor sepsis or shock.  She prefers to go home.  Her blood pressures been stable (the isolated hypotensive episode was an erroneous reading). [MT]  1020 Pt sleeping O2 to 90%, BP soft, upon awakening O2 back to 95%, BP now 975 systolic.   [MT]  1021 This time with her white blood cell count improving, the patient feeling back to baseline,  she wants to go home.  I think this is reasonable.  I explained to her that if her blood cultures positive, she will be contacted and needs to come back to the ER.  Likewise, if she has another episode of abdominal pain, lightheadedness, or any other concerning symptoms, she should return to the emergency department. [MT]    Clinical Course User Index [MT] Wyvonnia Dusky, MD   MDM Rules/Calculators/A&P  Patient presenting here with nausea, vomiting, and diarrhea that started suddenly in the night.  She appeared uncomfortable upon arrival, but improved with IV fluids and medications.  In the ED.  Patient's work-up shows a white count of 25,000.  This was concerning and prompted a CT scan to be performed.  CT scan shows no acute process, but she does have multiple stones in the gallbladder.  Patient observed for several hours in the ER and is feeling better.  Patient's care will be signed out to oncoming provider at shift change.  Patient to be reevaluated after p.o. challenge.  Disposition pending, but anticipate discharge.  Final Clinical Impression(s) / ED Diagnoses Final diagnoses:  None    Rx / DC Orders ED Discharge Orders     None        Veryl Speak, MD 08/16/20 0500

## 2020-08-15 NOTE — Discharge Instructions (Addendum)
You were seen in the emergency department for an episode of vomiting and weakness and chest pain.  Your work-up did not show signs of a heart attack, or any obvious source of infection.  You did have gallstones in your gallbladder scan.  You also had a high white blood cell count, which improved with some fluids.  We talked about the possibility of an infection.  At this time my suspicion is fairly low, and because you are feeling well and normal, I thought it was reasonable to discharge you home.  However we did send a blood culture to the lab.  If you are growing bacteria in your blood, you will get a phone call that you need to return to the ER immediately.  You would likely need IV antibiotics and hospital admission at that time.  Likewise, if you begin having sudden pain in your belly or chest again, or feel lightheaded, or have difficulty breathing, please come back immediately to the ER.  It is possible that you are developing gallbladder problems, and may need to be seen by a surgeon at that time.

## 2020-08-15 NOTE — ED Notes (Signed)
Patient transported to X-ray 

## 2020-08-16 ENCOUNTER — Telehealth: Payer: Self-pay

## 2020-08-16 NOTE — Telephone Encounter (Signed)
Pt called in states that she she would like test results from 08/14/20. I told her the results.

## 2020-08-21 LAB — CULTURE, BLOOD (ROUTINE X 2): Culture: NO GROWTH

## 2020-09-28 ENCOUNTER — Encounter: Payer: Self-pay | Admitting: Medical

## 2020-09-28 ENCOUNTER — Other Ambulatory Visit: Payer: Self-pay

## 2020-09-28 ENCOUNTER — Ambulatory Visit (INDEPENDENT_AMBULATORY_CARE_PROVIDER_SITE_OTHER): Payer: Medicare Other | Admitting: Medical

## 2020-09-28 ENCOUNTER — Ambulatory Visit (HOSPITAL_BASED_OUTPATIENT_CLINIC_OR_DEPARTMENT_OTHER)
Admission: RE | Admit: 2020-09-28 | Discharge: 2020-09-28 | Disposition: A | Discharge status: Home / Self Care | Payer: Medicare Other | Source: Ambulatory Visit | Attending: Medical | Admitting: Medical

## 2020-09-28 VITALS — BP 135/80 | HR 94 | Temp 98.7°F | Resp 18 | Ht <= 58 in | Wt 220.4 lb

## 2020-09-28 DIAGNOSIS — M545 Low back pain, unspecified: Secondary | ICD-10-CM | POA: Diagnosis present

## 2020-09-28 DIAGNOSIS — R350 Frequency of micturition: Secondary | ICD-10-CM

## 2020-09-28 DIAGNOSIS — R21 Rash and other nonspecific skin eruption: Secondary | ICD-10-CM

## 2020-09-28 LAB — POC URINALSYSI DIPSTICK (AUTOMATED)
Bilirubin, UA: NEGATIVE
Glucose, UA: NEGATIVE
Ketones, UA: NEGATIVE
Leukocytes, UA: NEGATIVE
Nitrite, UA: NEGATIVE
Protein, UA: NEGATIVE
Spec Grav, UA: >=1.03 — AB (ref 1.010–1.025)
Urobilinogen, UA: 0.2 E.U./dL
pH, UA: 5 (ref 5.0–8.0)

## 2020-09-28 MED ORDER — AMMONIUM LACTATE 12 % EX LOTN: TOPICAL_LOTION | CUTANEOUS | 0 refills | Status: DC

## 2020-09-28 NOTE — Progress Notes (Signed)
Subjective:    Patient ID: Amber Huffman, female    DOB: 04-17-1950, 70 y.o.   MRN: [id number]  HPI  Pt in with lower back pain. Pt had back pain for about one week. Pt states she usually uses can but lost cane 2-3 weeks ago and describes change in gait probable.   History of intermittent back pain in past. Pt taking ibuprofen once a day. It does help but pain does come back.  Pt level at times 8/10 briefly. Intermittent.  Rt hand skin dry and flaky. Saw some blood on sheets and presumed from hand. She dose wash her hands a lot. She has small bladder and urinates a lot and washes hands a lot.    Review of Systems  Constitutional:  Negative for chills, fatigue and fever.  Respiratory:  Negative for cough, chest tightness, shortness of breath, wheezing and stridor.   Cardiovascular:  Negative for chest pain and palpitations.  Gastrointestinal:  Negative for abdominal pain, constipation and diarrhea.  Genitourinary:  Positive for frequency. Negative for dysuria, flank pain, urgency and vaginal pain.       But hx of small bladder  Musculoskeletal:  Positive for back pain.       No radiating pain  Skin:  Positive for rash.       See hpi.    Past Medical History:  Diagnosis Date   Allergy    Anemia    Anxiety    Arthritis    Thumbs   Blood transfusion without reported diagnosis    Depression    Diabetes mellitus without complication ([place])    type 2   GERD (gastroesophageal reflux disease)    Hyperlipidemia    Hypertension    Morbid obesity with BMI of 50.0-59.9, adult (HCC)    Osteoporosis    Pneumonia    Substance abuse ([place])    ETOH abuse, quit drinking in 1985     Social History   Socioeconomic History   Marital status: Married    Spouse name: Not on file   Number of children: Not on file   Years of education: Not on file   Highest education level: Not on file  Occupational History   Not on file  Tobacco Use   Smoking status: Passive Smoke Exposure - Never  Smoker   Smokeless tobacco: Never  Vaping Use   Vaping Use: Never used  Substance and Sexual Activity   Alcohol use: Not Currently    Comment: 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   Drug use: Not Currently   Sexual activity: Not on file    Comment: Hysterectomy  Other Topics Concern   Not on file  Social History Narrative   Not on file   Social Determinants of Health   Financial Resource Strain: Not on file  Food Insecurity: Not on file  Transportation Needs: Not on file  Physical Activity: Not on file  Stress: Not on file  Social Connections: Not on file  Intimate Partner Violence: Not on file    Past Surgical History:  Procedure Laterality Date   ABDOMINAL HYSTERECTOMY     CERVIX REMOVAL     CESAREAN SECTION     COLONOSCOPY     OOPHORECTOMY     UPPER GASTROINTESTINAL ENDOSCOPY     WISDOM TOOTH EXTRACTION      Family History  Problem Relation Age of Onset   Heart  disease Father        After the age of 25   Uterine cancer Mother    Colon cancer Neg Hx    Esophageal cancer Neg Hx    Rectal cancer Neg Hx    Stomach cancer Neg Hx     Allergies  Allergen Reactions   Penicillins Rash    Has patient had a PCN reaction causing immediate rash, facial/tongue/throat swelling, SOB or lightheadedness with hypotension: Yes Has patient had a PCN reaction causing severe rash involving mucus membranes or skin necrosis: No Has patient had a PCN reaction that required hospitalization: No Has patient had a PCN reaction occurring within the last 10 years: No If all of the above answers are "NO", then may proceed with Cephalosporin use.    Current Outpatient Medications on File Prior to Visit  Medication Sig Dispense Refill   hydrochlorothiazide (HYDRODIURIL) 25 MG tablet Take 1 tablet by mouth once daily 90 tablet 0   ibuprofen (ADVIL) 600 MG tablet TAKE 1 TABLET BY MOUTH EVERY 8 HOURS AS  NEEDED 30 tablet 0   No current facility-administered medications on file prior to visit.    BP (!) 142/80 (BP Location: Left Wrist, Patient Position: Sitting, Cuff Size: Large)   Pulse 94   Temp 98.7 F (37.1 C) (Oral)   Resp 18   Ht '4\' 10"'$  (1.473 m)   Wt 220 lb 6.4 oz (100 kg)   SpO2 (!) 88%   BMI 46.06 kg/m       Objective:   Physical Exam  General Mental Status- Alert. General Appearance- Not in acute distress.   Skin Rt hand- skin appear dry and mild flaky.   Neck Carotid Arteries- Normal color. Moisture- Normal Moisture. No carotid bruits. No JVD.  Chest and Lung Exam Auscultation: Breath Sounds:-Normal.  Cardiovascular Auscultation:Rythm- Regular. Murmurs & Other Heart Sounds:Auscultation of the heart reveals- No Murmurs.  Abdomen Inspection:-Inspeection Normal. Palpation/Percussion:Note:No mass. Palpation and Percussion of the abdomen reveal- Non Tender, Non Distended + BS, no rebound or guarding. No suprapubic tenderness.    Neurologic Cranial Nerve exam:- CN III-XII intact(No nystagmus), symmetric smile. Strength:- 5/5 equal and symmetric strength both upper and lower extremities.    Back Mid lumbar spine tenderness to palpation. Bilateral moderate SI tenderness to palpation. Pain on lateral movements and flexion/extension of the spine.  Lower ext neurologic  L5-S1 sensation intact bilaterally. Normal patellar reflexes bilaterally. No foot drop bilaterally.       Assessment & Plan:   Patient Instructions  Recent lower lumbar and bilateral SI area pain.  Based on describe severity recently we will go ahead and get lumbar spine x-ray.  In the past you are hesitant to use medications I do think you would benefit from Tylenol over-the-counter and a lidocaine patch.(Salon pas is 1 brand).  Start with Tylenol and lidocaine patch and if needed add low-dose ibuprofen 200 to 400 mg every 8 hours as needed.  Consider getting new cane as change in gait  might be playing a role in your back pain.  History of hypertension but on recheck your blood pressure is well controlled.  Some dry slight flaky skin to right hand.  This could be due to excessive handwashing.  Recommend moisturize.  Did send in prescription for Lac-Hydrin lotion.  If he cannot feel that the use of the no over-the-counter.  Try to limit handwashing.  Could also occasionally use hydrocortisone topical cream.  Recent frequent urination but you do have known small bladder.  We will get a urinalysis and do culture if indicated per UA.  Follow-up in 7 to 10 days or sooner if needed.

## 2020-09-28 NOTE — Patient Instructions (Signed)
Recent lower lumbar and bilateral SI area pain.  Based on describe severity recently we will go ahead and get lumbar spine x-ray.  In the past you are hesitant to use medications I do think you would benefit from Tylenol over-the-counter and a lidocaine patch.(Salon pas is 1 brand).  Start with Tylenol and lidocaine patch and if needed add low-dose ibuprofen 200 to 400 mg every 8 hours as needed.  Consider getting new cane as change in gait might be playing a role in your back pain.  History of hypertension but on recheck your blood pressure is well controlled.  Some dry slight flaky skin to right hand.  This could be due to excessive handwashing.  Recommend moisturize.  Did send in prescription for Lac-Hydrin lotion.  If he cannot feel that the use of the no over-the-counter.  Try to limit handwashing.  Could also occasionally use hydrocortisone topical cream.  Recent frequent urination but you do have known small bladder.  We will get a urinalysis and do culture if indicated per UA.  Follow-up in 7 to 10 days or sooner if needed.

## 2020-11-05 ENCOUNTER — Telehealth: Payer: Self-pay | Admitting: Medical

## 2020-11-05 NOTE — Telephone Encounter (Signed)
Left message for patient to call back and schedule Medicare Annual Wellness Visit (AWV) in office.  ° °If not able to come in office, please offer to do virtually or by telephone.  Left office number and my jabber #[phone]. ° °Due for AWVI ° °Please schedule at anytime with Nurse Health Advisor. °  °

## 2020-11-06 ENCOUNTER — Other Ambulatory Visit (HOSPITAL_BASED_OUTPATIENT_CLINIC_OR_DEPARTMENT_OTHER): Payer: Self-pay | Admitting: Medical

## 2020-11-06 DIAGNOSIS — Z1231 Encounter for screening mammogram for malignant neoplasm of breast: Secondary | ICD-10-CM

## 2020-11-12 ENCOUNTER — Telehealth: Payer: Self-pay | Admitting: Medical

## 2020-11-12 NOTE — Telephone Encounter (Addendum)
Pt scheduled with PCP tomorrow 11/13/20.   [facility] - ClientTELEPHONE ADVICE RECORDAccessNurse PatientName:Amber Huffman [phone](Primary) Chief Complaint Dizziness Reason for Call Symptomatic / Request for Health Information Initial Comment Caller began dizziness and blurry vision which had continued for a week since last SUN 10/23. Dizziness and blurry vision come and go when she moves or changes position. Nurse: D'Heur Lucia Gaskins, RN, Adrienne Date/Time (Eastern Time): 11/12/2020 9:16:32 AM Confirm and document reason for call. If symptomatic, describe symptoms. ---Caller began dizziness and blurry vision (which started on 10/25 ) which had continued for a week since 10/23. Dizziness and blurry vision come and go when she moves or changes position. It is vertigo she is complaining of. It has happened even lying down. She has also been having cold s/s since Friday or Saturday. No fever. Does the patient have any new or worsening symptoms? ---Yes Will a triage be completed? ---Yes Related visit to physician within the last 2 weeks? ---No Does the PT have any chronic conditions? (i.e. diabetes, asthma, this includes High risk factors for pregnancy, etc.) ---Yes List chronic conditions. ---diabetes, HTN, cardiac issues Is this a behavioral health or substance abuse call? ---No Guidelines Guideline Title Affirmed Question Affirmed Notes Nurse Date/Time (Eastern Time) Dizziness - Vertigo [1] NO dizziness now AND [2] one or more stroke risk factors (i.e., hypertension, D'Heur Lucia Gaskins, RN, Vincente Liberty 11/12/2020 9:22:27 AM PLEASE NOTE: All timestamps contained within this report are represented as [place] Standard Time. CONFIDENTIALTY NOTICE: This fax transmission is intended only for the addressee. It contains information that is legally privileged, confidential or otherwise protected from use or disclosure. If you are not the intended  recipient, you are strictly prohibited from reviewing, disclosing, copying using or disseminating any of this information or taking any action in reliance on or regarding this information. If you have received this fax in error, please notify us immediately by telephone so that we can arrange for its return to [place]. Phone: [phone], Toll-Free: [phone], Fax: [phone] Page: 2 of 2 Call Id: [id number] Guidelines Guideline Title Affirmed Question Affirmed Notes Nurse Date/Time Eilene Ghazi Time) diabetes, prior stroke/ TIA/heart attack) Disp. Time Eilene Ghazi Time) Disposition Final User 11/12/2020 9:27:22 AM See PCP within 24 Hours Yes D'Heur Lucia Gaskins, RN, Vincente Liberty Caller Disagree/Comply Comply Caller Understands Yes PreDisposition Call Doctor Care Advice Given Per Guideline SEE PCP WITHIN 24 HOURS: * IF OFFICE WILL BE OPEN: You need to be examined within the next 24 hours. Call your doctor (or NP/PA) when the office opens and make an appointment. FALL PREVENTION: * Sit at side of bed for several minutes before standing up. Stand up slowly. * Avoid sudden movements of head. CALL BACK IF: * Severe headache occurs * Weakness develops in an arm or leg * Unable to walk without falling * Dizziness becomes constant * You become worse CARE ADVICE given per Dizziness - Vertigo (Adult) guideline. Comments User: Vincente Liberty, D'Heur Lucia Gaskins, RN Date/Time Eilene Ghazi Time): 11/12/2020 9:20:23 AM Caller states she has runny nose on right side, congestion. User: Adrienne, D'Heur Lucia Gaskins, RN Date/Time Eilene Ghazi Time): 11/12/2020 9:24:10 AM Caller states she gets numbness on one side of her body when she is sleeping on her side. User: Vincente Liberty, D'Heur Lucia Gaskins, RN Date/Time Eilene Ghazi Time): 11/12/2020 9:29:25 AM Warm transferred to office for appointment. Referrals REFERRED TO PCP OFFICE

## 2020-11-12 NOTE — Telephone Encounter (Signed)
Pt stated they were experiencing waves of dizziness and blurred vision. She was sent to triage.

## 2020-11-13 ENCOUNTER — Ambulatory Visit (INDEPENDENT_AMBULATORY_CARE_PROVIDER_SITE_OTHER): Payer: Medicare Other | Admitting: Medical

## 2020-11-13 ENCOUNTER — Ambulatory Visit: Payer: Medicare Other | Admitting: Family

## 2020-11-13 ENCOUNTER — Encounter: Payer: Self-pay | Admitting: Medical

## 2020-11-13 ENCOUNTER — Other Ambulatory Visit: Payer: Self-pay

## 2020-11-13 VITALS — BP 145/78 | HR 97 | Temp 98.1°F | Ht <= 58 in | Wt 222.0 lb

## 2020-11-13 DIAGNOSIS — R0981 Nasal congestion: Secondary | ICD-10-CM | POA: Diagnosis not present

## 2020-11-13 DIAGNOSIS — I10 Essential (primary) hypertension: Secondary | ICD-10-CM | POA: Diagnosis not present

## 2020-11-13 DIAGNOSIS — R42 Dizziness and giddiness: Secondary | ICD-10-CM | POA: Diagnosis not present

## 2020-11-13 DIAGNOSIS — H538 Other visual disturbances: Secondary | ICD-10-CM

## 2020-11-13 DIAGNOSIS — J3489 Other specified disorders of nose and nasal sinuses: Secondary | ICD-10-CM

## 2020-11-13 LAB — POC COVID19 BINAXNOW: SARS Coronavirus 2 Ag: NEGATIVE

## 2020-11-13 MED ORDER — MECLIZINE HCL 12.5 MG PO TABS: 12.5000 mg | ORAL_TABLET | Freq: Three times a day (TID) | ORAL | 0 refills | Status: DC | PRN

## 2020-11-13 MED ORDER — AZITHROMYCIN 250 MG PO TABS: ORAL_TABLET | ORAL | 0 refills | Status: AC

## 2020-11-13 MED ORDER — AMLODIPINE BESYLATE 2.5 MG PO TABS: 2.5000 mg | ORAL_TABLET | Freq: Every day | ORAL | 3 refills | Status: DC

## 2020-11-13 MED ORDER — FLUTICASONE PROPIONATE 50 MCG/ACT NA SUSP: 2.0000 | Freq: Every day | NASAL | 1 refills | Status: DC

## 2020-11-13 NOTE — Progress Notes (Signed)
Subjective:    Patient ID: Monya Kozakiewicz, female    DOB: 1950-03-05, 70 y.o.   MRN: [id number]  HPI  Pt states a week ago this past Sunday she woke up and she felt like room was spinning. States was moderate to severe. But has been gradually improving but does reoccur sporadically. On Tuesday she had some mild blurred vision. Since Tuesday past week she  report gradual improving symptoms. Prominent symptoms for 2 days.  On Saturday nasal congestion, runny nose and little dry cough. Pt has not checked to see if had covid. She had one vaccine in the past and past January did have covid.      Review of Systems  Constitutional:  Negative for chills, fatigue and fever.  HENT:  Positive for congestion, postnasal drip, sinus pressure and sinus pain.   Respiratory:  Negative for cough, chest tightness, shortness of breath and wheezing.   Cardiovascular:  Negative for chest pain and palpitations.  Gastrointestinal:  Negative for abdominal distention, abdominal pain, blood in stool, constipation and diarrhea.  Musculoskeletal:  Negative for back pain, gait problem, myalgias and neck pain.  Skin:  Negative for rash.      Past Medical History:  Diagnosis Date   Allergy    Anemia    Anxiety    Arthritis    Thumbs   Blood transfusion without reported diagnosis    Depression    Diabetes mellitus without complication ([place])    type 2   GERD (gastroesophageal reflux disease)    Hyperlipidemia    Hypertension    Morbid obesity with BMI of 50.0-59.9, adult (HCC)    Osteoporosis    Pneumonia    Substance abuse ([place])    ETOH abuse, quit drinking in 1985     Social History   Socioeconomic History   Marital status: Married    Spouse name: Not on file   Number of children: Not on file   Years of education: Not on file   Highest education level: Not on file  Occupational History   Not on file  Tobacco Use   Smoking status: Never    Passive exposure: Yes   Smokeless tobacco: Never   Vaping Use   Vaping Use: Never used  Substance and Sexual Activity   Alcohol use: Not Currently    Comment: 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   Drug use: Not Currently   Sexual activity: Not on file    Comment: Hysterectomy  Other Topics Concern   Not on file  Social History Narrative   Not on file   Social Determinants of Health   Financial Resource Strain: Not on file  Food Insecurity: Not on file  Transportation Needs: Not on file  Physical Activity: Not on file  Stress: Not on file  Social Connections: Not on file  Intimate Partner Violence: Not on file    Past Surgical History:  Procedure Laterality Date   ABDOMINAL HYSTERECTOMY     CERVIX REMOVAL     CESAREAN SECTION     COLONOSCOPY     OOPHORECTOMY     UPPER GASTROINTESTINAL ENDOSCOPY     WISDOM TOOTH EXTRACTION      Family History  Problem Relation Age of Onset   Heart disease Father        After the age of 75   Uterine cancer Mother    Colon cancer Neg  Hx    Esophageal cancer Neg Hx    Rectal cancer Neg Hx    Stomach cancer Neg Hx     Allergies  Allergen Reactions   Penicillins Rash    Has patient had a PCN reaction causing immediate rash, facial/tongue/throat swelling, SOB or lightheadedness with hypotension: Yes Has patient had a PCN reaction causing severe rash involving mucus membranes or skin necrosis: No Has patient had a PCN reaction that required hospitalization: No Has patient had a PCN reaction occurring within the last 10 years: No If all of the above answers are "NO", then may proceed with Cephalosporin use.    Current Outpatient Medications on File Prior to Visit  Medication Sig Dispense Refill   ammonium lactate (LAC-HYDRIN) 12 % lotion Apply twice a day 400 g 0   hydrochlorothiazide (HYDRODIURIL) 25 MG tablet Take 1 tablet by mouth once daily 90 tablet 0   ibuprofen (ADVIL) 600 MG tablet TAKE  1 TABLET BY MOUTH EVERY 8 HOURS AS NEEDED 30 tablet 0   No current facility-administered medications on file prior to visit.    BP (!) 159/79   Pulse 97   Temp 98.1 F (36.7 C)   Ht 4\' 10"  (1.473 m)   Wt 222 lb (100.7 kg)   SpO2 91%   BMI 46.40 kg/m        Objective:   Physical Exam  General Mental Status- Alert. General Appearance- Not in acute distress.   Skin General: Color- Normal Color. Moisture- Normal Moisture.  Neck Carotid Arteries- Normal color. Moisture- Normal Moisture. No carotid bruits. No JVD.  Chest and Lung Exam Auscultation: Breath Sounds:-Normal.  Cardiovascular Auscultation:Rythm- Regular. Murmurs & Other Heart Sounds:Auscultation of the heart reveals- No Murmurs.  Abdomen Inspection:-Inspeection Normal. Palpation/Percussion:Note:No mass. Palpation and Percussion of the abdomen reveal- Non Tender, Non Distended + BS, no rebound or guarding.   Neurologic Cranial Nerve exam:- CN III-XII intact(No nystagmus), symmetric smile. Drift Test:- No drift. Romberg Exam:- Negative.  Heal to Toe Gait exam:-Normal. Finger to Nose:- Normal/Intact Strength:- 5/5 equal and symmetric strength both upper and lower extremities.       Assessment & Plan:   Patient Instructions  Hypertension.  Blood pressure is not ideally controlled today.  After discussion you want to stay on your HCTZ 25 mg and check blood pressure morning, noon and at night.  If your blood pressures are over 140/90 then add amlodipine 2.5mg  daily to current regimen.  Recent vertigo/dizziness.  Your signs and symptoms seem to be resolving over the last couple of days.  No inducible dizziness or vertigo on exam.  Normal motor and sensory function on exam.  If you do have recurrent vertigo or dizziness lasting more than 5 minutes then start meclizine.  If more vertigo-like then try Epley maneuvers.  Printing off education information but you could also go on YouTube and find physical therapy  that demonstrates the maneuver.  If any gross motor or sensory function deficit type symptoms occur then be seen in the emergency department for imaging.  Note imaging not indicated presently.  Will get CBC and metabolic panel today.  For nasal congestion and mild sinus pressure prescribing Flonase for probable allergies.  Your COVID test came back negative.  If you start to get sinus pressure like sinus infection then start azithromycin.  Mild transient or blurred vision.  Visual acuity check today in office.  Recommended to get scheduled for optometrist for visual acuity/eye check.  Follow-up in 2 to 3 weeks  or sooner if needed.   Mackie Pai, PA-C

## 2020-11-13 NOTE — Patient Instructions (Addendum)
Hypertension.  Blood pressure is not ideally controlled today.  After discussion you want to stay on your HCTZ 25 mg and check blood pressure morning, noon and at night.  If your blood pressures are over 140/90 then add amlodipine 2.5mg  daily to current regimen.  Recent vertigo/dizziness.  Your signs and symptoms seem to be resolving over the last couple of days.  No inducible dizziness or vertigo on exam.  Normal motor and sensory function on exam.  If you do have recurrent vertigo or dizziness lasting more than 5 minutes then start meclizine.  If more vertigo-like then try Epley maneuvers.  Printing off education information but you could also go on YouTube and find physical therapy that demonstrates the maneuver.  If any gross motor or sensory function deficit type symptoms occur then be seen in the emergency department for imaging.  Note imaging not indicated presently.  Will get CBC and metabolic panel today.  For nasal congestion and mild sinus pressure prescribing Flonase for probable allergies.  Your COVID test came back negative.  If you start to get sinus pressure like sinus infection then start azithromycin.  Mild transient blurred vision.  Visual acuity check today in office.  Recommended to get scheduled for optometrist for visual acuity/eye check.  Follow-up in 2 to 3 weeks or sooner if needed.  How to Perform the Epley Maneuver The Epley maneuver is an exercise that relieves symptoms of vertigo. Vertigo is the feeling that you or your surroundings are moving when they are not. When you feel vertigo, you may feel like the room is spinning and may have trouble walking. The Epley maneuver is used for a type of vertigo caused by a calcium deposit in a part of the inner ear. The maneuver involves changing head positions to help the deposit move out of the area. You can do this maneuver at home whenever you have symptoms of vertigo. You can repeat it in 24 hours if your vertigo has not gone  away. Even though the Epley maneuver may relieve your vertigo for a few weeks, it is possible that your symptoms will return. This maneuver relieves vertigo, but it does not relieve dizziness. What are the risks? If it is done correctly, the Epley maneuver is considered safe. Sometimes it can lead to dizziness or nausea that goes away after a short time. If you develop other symptoms--such as changes in vision, weakness, or numbness--stop doing the maneuver and call your health care provider. Supplies needed: A bed or table. A pillow. How to do the Epley maneuver   Sit on the edge of a bed or table with your back straight and your legs extended or hanging over the edge of the bed or table. Turn your head halfway toward the affected ear or side as told by your health care provider. Lie backward quickly with your head turned until you are lying flat on your back. Your head should dangle (head-hanging position). You may want to position a pillow under your shoulders. Hold this position for at least 30 seconds. If you feel dizzy or have symptoms of vertigo, continue to hold the position until the symptoms stop. Turn your head to the opposite direction until your unaffected ear is facing down. Your head should continue to dangle. Hold this position for at least 30 seconds. If you feel dizzy or have symptoms of vertigo, continue to hold the position until the symptoms stop. Turn your whole body to the same side as your head so  that you are positioned on your side. Your head will now be nearly facedown and no longer needs to dangle. Hold for at least 30 seconds. If you feel dizzy or have symptoms of vertigo, continue to hold the position until the symptoms stop. Sit back up. You can repeat the maneuver in 24 hours if your vertigo does not go away. Follow these instructions at home: For 24 hours after doing the Epley maneuver: Keep your head in an upright position. When lying down to sleep or rest, keep  your head raised (elevated) with two or more pillows. Avoid excessive neck movements. Activity Do not drive or use machinery if you feel dizzy. After doing the Epley maneuver, return to your normal activities as told by your health care provider. Ask your health care provider what activities are safe for you. General instructions Drink enough fluid to keep your urine pale yellow. Do not drink alcohol. Take over-the-counter and prescription medicines only as told by your health care provider. Keep all follow-up visits. This is important. Preventing vertigo symptoms Ask your health care provider if there is anything you should do at home to prevent vertigo. He or she may recommend that you: Keep your head elevated with two or more pillows while you sleep. Do not sleep on the side of your affected ear. Get up slowly from bed. Avoid sudden movements during the day. Avoid extreme head positions or movement, such as looking up or bending over. Contact a health care provider if: Your vertigo gets worse. You have other symptoms, including: Nausea. Vomiting. Headache. Get help right away if you: Have vision changes. Have a headache or neck pain that is severe or getting worse. Cannot stop vomiting. Have new numbness or weakness in any part of your body. These symptoms may represent a serious problem that is an emergency. Do not wait to see if the symptoms will go away. Get medical help right away. Call your local emergency services (911 in the [place] Do not drive yourself to the hospital. Summary Vertigo is the feeling that you or your surroundings are moving when they are not. The Epley maneuver is an exercise that relieves symptoms of vertigo. If the Epley maneuver is done correctly, it is considered safe. This information is not intended to replace advice given to you by your health care provider. Make sure you discuss any questions you have with your health care provider. Document  Revised: 11/30/2019 Document Reviewed: 11/30/2019 Elsevier Patient Education  2022 [employer].

## 2020-11-14 LAB — CBC WITH DIFFERENTIAL/PLATELET
Basophils Absolute: 0.1 10*3/uL (ref 0.0–0.1)
Basophils Relative: 0.6 % (ref 0.0–3.0)
Eosinophils Absolute: 0.5 10*3/uL (ref 0.0–0.7)
Eosinophils Relative: 4.8 % (ref 0.0–5.0)
HCT: 44.8 % (ref 36.0–46.0)
Hemoglobin: 14.5 g/dL (ref 12.0–15.0)
Lymphocytes Relative: 19.1 % (ref 12.0–46.0)
Lymphs Abs: 1.9 10*3/uL (ref 0.7–4.0)
MCHC: 32.3 g/dL (ref 30.0–36.0)
MCV: 85.1 fl (ref 78.0–100.0)
Monocytes Absolute: 0.9 10*3/uL (ref 0.1–1.0)
Monocytes Relative: 9.4 % (ref 3.0–12.0)
Neutro Abs: 6.6 10*3/uL (ref 1.4–7.7)
Neutrophils Relative %: 66.1 % (ref 43.0–77.0)
Platelets: 314 10*3/uL (ref 150.0–400.0)
RBC: 5.26 Mil/uL — ABNORMAL HIGH (ref 3.87–5.11)
RDW: 13.8 % (ref 11.5–15.5)
WBC: 10 10*3/uL (ref 4.0–10.5)

## 2020-11-14 LAB — COMPREHENSIVE METABOLIC PANEL
ALT: 10 U/L (ref 0–35)
AST: 13 U/L (ref 0–37)
Albumin: 4 g/dL (ref 3.5–5.2)
Alkaline Phosphatase: 84 U/L (ref 39–117)
BUN: 18 mg/dL (ref 6–23)
CO2: 34 mEq/L — ABNORMAL HIGH (ref 19–32)
Calcium: 9.2 mg/dL (ref 8.4–10.5)
Chloride: 98 mEq/L (ref 96–112)
Creatinine, Ser: 0.78 mg/dL (ref 0.40–1.20)
GFR: 75.67 mL/min (ref >60.00)
Glucose, Bld: 112 mg/dL — ABNORMAL HIGH (ref 70–99)
Potassium: 3.9 mEq/L (ref 3.5–5.1)
Sodium: 140 mEq/L (ref 135–145)
Total Bilirubin: 0.3 mg/dL (ref 0.2–1.2)
Total Protein: 6.6 g/dL (ref 6.0–8.3)

## 2020-11-28 ENCOUNTER — Other Ambulatory Visit: Payer: Self-pay

## 2020-11-28 ENCOUNTER — Ambulatory Visit (INDEPENDENT_AMBULATORY_CARE_PROVIDER_SITE_OTHER): Payer: Medicare Other | Admitting: Medical

## 2020-11-28 VITALS — BP 140/63 | HR 89 | Temp 97.9°F | Resp 18 | Ht <= 58 in | Wt 220.0 lb

## 2020-11-28 DIAGNOSIS — I10 Essential (primary) hypertension: Secondary | ICD-10-CM

## 2020-11-28 DIAGNOSIS — R42 Dizziness and giddiness: Secondary | ICD-10-CM

## 2020-11-28 DIAGNOSIS — R739 Hyperglycemia, unspecified: Secondary | ICD-10-CM

## 2020-11-28 LAB — HEMOGLOBIN A1C: Hgb A1c MFr Bld: 6.5 % (ref 4.6–6.5)

## 2020-11-28 NOTE — Patient Instructions (Addendum)
Htn- bp is borderline today. Continue hctz and please get bp monitor to check as discussed. If bp over 140/90 then start amlodipine 2.5 mg as we discussed.  Dizziness is a lot better. Do Epley manuevers as discussed if needed. Use meclizine if needed for dizziness or vertigo over 5 minutes. If you have any accompanying motor or sensory deficits then be seen in the ED.  For elevated sugar get a1c today.   For depression continue with counseling. If you feel you need depression meds let me know.  Follow up in one month or sooner if needed.   PCV 20 vaccine offered. Can do later when you decide.

## 2020-11-28 NOTE — Progress Notes (Addendum)
Subjective:    Patient ID: Amber Huffman, female    DOB: Mar 15, 1950, 70 y.o.   MRN: [id number]  HPI  Pt in for follow up for htn/bp check. Pt never found her bp cuff so never checked her bp. Today her bp is 140/60. Pt is only on hctz 25 mg daily. I had plans to add amlodipine in event her bp was over 140/90 on checks. She will find machine or buy machine from [facility].  Pt states her dizziness and vertigo has mostly improved. She gets some occasionally. No vision loss or gross motor/sensory deficits. When states if gets dizzy can occur when rolls over in bed. No describing vertigo recently. On position changes states very slow when changes position to avoid dizziness. Pt states if she gets dizzy lasting seconds. She has not had to use any meclizine.  Pt cmp good except mild elevated sugar. Cbc showed no anemia.  Depression. Son passed Apr 29, 2022 and sister died January 2(both 2022). Less depressed and less crying but still depressed. Pt is going to grief counseling.  Pt declines pneumonia pcv 20 vaccine since it is new. She is not sure what pneumonia vaccine she had in the past.  Review of Systems  Constitutional:  Negative for chills, fatigue and fever.  HENT:  Negative for congestion, drooling and ear pain.   Respiratory:  Negative for cough, chest tightness, shortness of breath and wheezing.   Cardiovascular:  Negative for chest pain.  Gastrointestinal:  Negative for abdominal pain, blood in stool, constipation and nausea.  Genitourinary:  Negative for dysuria and frequency.  Musculoskeletal:  Negative for back pain, joint swelling and myalgias.  Skin:  Negative for rash.  Neurological:  Negative for dizziness, light-headedness and headaches.       None presently.  Psychiatric/Behavioral:  Negative for behavioral problems and confusion.     Past Medical History:  Diagnosis Date   Allergy    Anemia    Anxiety    Arthritis    Thumbs   Blood transfusion without reported  diagnosis    Depression    Diabetes mellitus without complication ([place])    type 2   GERD (gastroesophageal reflux disease)    Hyperlipidemia    Hypertension    Morbid obesity with BMI of 50.0-59.9, adult (HCC)    Osteoporosis    Pneumonia    Substance abuse ([place])    ETOH abuse, quit drinking in 1985     Social History   Socioeconomic History   Marital status: Married    Spouse name: Not on file   Number of children: Not on file   Years of education: Not on file   Highest education level: Not on file  Occupational History   Not on file  Tobacco Use   Smoking status: Never    Passive exposure: Yes   Smokeless tobacco: Never  Vaping Use   Vaping Use: Never used  Substance and Sexual Activity   Alcohol use: Not Currently    Comment: 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   Drug use: Not Currently   Sexual activity: Not on file    Comment: Hysterectomy  Other Topics Concern   Not on file  Social History Narrative   Not on file   Social Determinants of Health   Financial Resource Strain: Not on file  Food Insecurity: Not on file  Transportation Needs: Not on file  Physical Activity: Not on file  Stress: Not on file  Social Connections: Not on file  Intimate Partner Violence: Not on file    Past Surgical History:  Procedure Laterality Date   ABDOMINAL HYSTERECTOMY     CERVIX REMOVAL     CESAREAN SECTION     COLONOSCOPY     OOPHORECTOMY     UPPER GASTROINTESTINAL ENDOSCOPY     WISDOM TOOTH EXTRACTION      Family History  Problem Relation Age of Onset   Heart disease Father        After the age of 47   Uterine cancer Mother    Colon cancer Neg Hx    Esophageal cancer Neg Hx    Rectal cancer Neg Hx    Stomach cancer Neg Hx     Allergies  Allergen Reactions   Penicillins Rash    Has patient had a PCN reaction causing immediate rash, facial/tongue/throat swelling, SOB  or lightheadedness with hypotension: Yes Has patient had a PCN reaction causing severe rash involving mucus membranes or skin necrosis: No Has patient had a PCN reaction that required hospitalization: No Has patient had a PCN reaction occurring within the last 10 years: No If all of the above answers are "NO", then may proceed with Cephalosporin use.    Current Outpatient Medications on File Prior to Visit  Medication Sig Dispense Refill   amLODipine (NORVASC) 2.5 MG tablet Take 1 tablet (2.5 mg total) by mouth daily. 30 tablet 3   ammonium lactate (LAC-HYDRIN) 12 % lotion Apply twice a day 400 g 0   fluticasone (FLONASE) 50 MCG/ACT nasal spray Place 2 sprays into both nostrils daily. 16 g 1   hydrochlorothiazide (HYDRODIURIL) 25 MG tablet Take 1 tablet by mouth once daily 90 tablet 0   ibuprofen (ADVIL) 600 MG tablet TAKE 1 TABLET BY MOUTH EVERY 8 HOURS AS NEEDED 30 tablet 0   meclizine (ANTIVERT) 12.5 MG tablet Take 1 tablet (12.5 mg total) by mouth 3 (three) times daily as needed for dizziness. 30 tablet 0   No current facility-administered medications on file prior to visit.    BP 140/63   Pulse 89   Temp 97.9 F (36.6 C)   Resp 18   Ht 4\' 10"  (1.473 m)   Wt 220 lb (99.8 kg)   SpO2 97%   BMI 45.98 kg/m       Objective:   Physical Exam  General Mental Status- Alert. General Appearance- Not in acute distress.   Skin General: Color- Normal Color. Moisture- Normal Moisture.  Neck Carotid Arteries- Normal color. Moisture- Normal Moisture. No carotid bruits. No JVD.  Chest and Lung Exam Auscultation: Breath Sounds:-Normal.  Cardiovascular Auscultation:Rythm- Regular. Murmurs & Other Heart Sounds:Auscultation of the heart reveals- No Murmurs.  Abdomen Inspection:-Inspeection Normal. Palpation/Percussion:Note:No mass. Palpation and Percussion of the abdomen reveal- Non Tender, Non Distended + BS, no rebound or guarding.   Neurologic Cranial Nerve exam:- CN III-XII  intact(No nystagmus), symmetric smile. Strength:- 5/5 equal and symmetric strength both upper and lower extremities.       Assessment & Plan:   Patient Instructions  Htn- bp is borderline today. Continue hctz and please get bp monitor to check as discussed. If bp over 140/90 then start amlodipine 2.5 mg as we discussed.  Dizziness is a lot better. Do Epley manuevers as discussed if needed. Use meclizine if needed for dizziness or vertigo over 5 minutes. If you have any accompanying motor or sensory deficits then be  seen in the ED.  For elevated sugar get a1c today.   For depression continue with counseling. If you feel you need depression meds let me know.  Follow up in one month or sooner if needed.     PCV 20 vaccine offered. Can do later when you decide.   Mackie Pai, PA-C

## 2020-11-28 NOTE — Addendum Note (Signed)
Addended by: Anabel Halon on: 11/28/2020 08:55 AM   Modules accepted: Level of Service

## 2020-11-30 ENCOUNTER — Telehealth: Payer: Self-pay | Admitting: Medical

## 2020-11-30 NOTE — Telephone Encounter (Signed)
Patient would like a call back to go over lab results. Please advice.

## 2020-11-30 NOTE — Telephone Encounter (Signed)
Pt called back labs reviewed

## 2020-12-03 ENCOUNTER — Encounter (HOSPITAL_BASED_OUTPATIENT_CLINIC_OR_DEPARTMENT_OTHER): Payer: Self-pay

## 2020-12-03 ENCOUNTER — Ambulatory Visit (HOSPITAL_BASED_OUTPATIENT_CLINIC_OR_DEPARTMENT_OTHER)
Admission: RE | Admit: 2020-12-03 | Discharge: 2020-12-03 | Disposition: A | Discharge status: Home / Self Care | Payer: Medicare Other | Source: Ambulatory Visit | Attending: Medical | Admitting: Medical

## 2020-12-03 ENCOUNTER — Other Ambulatory Visit: Payer: Self-pay

## 2020-12-03 DIAGNOSIS — Z1231 Encounter for screening mammogram for malignant neoplasm of breast: Secondary | ICD-10-CM | POA: Insufficient documentation

## 2020-12-04 ENCOUNTER — Telehealth: Payer: Self-pay | Admitting: Medical

## 2020-12-04 MED ORDER — CLONAZEPAM 0.5 MG PO TABS: 0.5000 mg | ORAL_TABLET | Freq: Two times a day (BID) | ORAL | 0 refills | Status: DC | PRN

## 2020-12-04 MED ORDER — ALPRAZOLAM 0.5 MG PO TABS: 0.5000 mg | ORAL_TABLET | Freq: Two times a day (BID) | ORAL | 0 refills | Status: DC | PRN

## 2020-12-04 NOTE — Telephone Encounter (Signed)
Rx cancelled at [facility]

## 2020-12-04 NOTE — Telephone Encounter (Signed)
Rx limited clonazepam sent to pt pharmacy.

## 2020-12-04 NOTE — Telephone Encounter (Signed)
Called pt , she stated she would rather have xanax since it was sent in when her son first died and doesn't know how the new medication will work for her .Marland Kitchen    Other rx cancelled

## 2020-12-04 NOTE — Telephone Encounter (Signed)
Patient would like for Amber Huffman to send her something for her anxiety again, she states that because of the holidays coming up she has been feeling worse remembering the loss of her sister and her son. Please advice.

## 2020-12-04 NOTE — Telephone Encounter (Signed)
Rx xanax sent to pt pharmacy. Cancelled clonazepam.

## 2020-12-21 ENCOUNTER — Other Ambulatory Visit: Payer: Self-pay | Admitting: Medical

## 2020-12-28 ENCOUNTER — Ambulatory Visit (INDEPENDENT_AMBULATORY_CARE_PROVIDER_SITE_OTHER): Payer: Medicare Other | Admitting: Medical

## 2020-12-28 VITALS — BP 132/50 | HR 95 | Temp 98.2°F | Resp 18 | Ht <= 58 in | Wt 223.8 lb

## 2020-12-28 DIAGNOSIS — E559 Vitamin D deficiency, unspecified: Secondary | ICD-10-CM

## 2020-12-28 DIAGNOSIS — I10 Essential (primary) hypertension: Secondary | ICD-10-CM | POA: Diagnosis not present

## 2020-12-28 DIAGNOSIS — R739 Hyperglycemia, unspecified: Secondary | ICD-10-CM

## 2020-12-28 DIAGNOSIS — F419 Anxiety disorder, unspecified: Secondary | ICD-10-CM | POA: Diagnosis not present

## 2020-12-28 DIAGNOSIS — G629 Polyneuropathy, unspecified: Secondary | ICD-10-CM

## 2020-12-28 DIAGNOSIS — M5412 Radiculopathy, cervical region: Secondary | ICD-10-CM

## 2020-12-28 DIAGNOSIS — F32A Depression, unspecified: Secondary | ICD-10-CM

## 2020-12-28 MED ORDER — ALPRAZOLAM 0.5 MG PO TABS: 0.5000 mg | ORAL_TABLET | Freq: Two times a day (BID) | ORAL | 0 refills | Status: DC | PRN

## 2020-12-28 MED ORDER — GABAPENTIN 100 MG PO CAPS: 100.0000 mg | ORAL_CAPSULE | Freq: Three times a day (TID) | ORAL | 3 refills | Status: DC

## 2020-12-28 NOTE — Progress Notes (Signed)
Subjective:    Patient ID: Amber Huffman, female    DOB: 1950-07-31, 69 y.o.   MRN: [id number]  HPI  Pt in for follow up. various issues.  Htn-Pt on amlodipine 2.5 mg daily. She also on hctz. She has not been checking her bp though did by the cuff. Today bp is 132/50.   Elevated sugar- pt bought machine but has not checked her insurance.  Depression- pt attending grief counseling. She get anxious at time and with holidays want refill of xanax.   Rt hand numbness, burning and hurting. In past noticed at night but now happening all  day intermittently. Pt states had for at least 2 months. Maybe 3-4 moths   08-16-2019 xray done for neck pain.    Pt dizziness did resolve.    "FINDINGS: There is no acute fracture or subluxation of the cervical spine. There is straightening of normal cervical lordosis which may be positional or due to muscle spasm. The visualized posterior elements and odontoid appear intact. There is anatomic alignment of the lateral masses of C1 and C2. There multilevel degenerative changes with disc space narrowing and spurring primarily at C5-C6. Bilateral C4-C5 and C5-C6 neural foramina narrowing. The soft tissues are unremarkable.   IMPRESSION: 1. No acute/traumatic cervical spine pathology. 2. Multilevel degenerative changes."    Review of Systems  Constitutional:  Negative for chills and fever.  HENT:  Negative for congestion and ear pain.   Respiratory:  Negative for cough, chest tightness, shortness of breath and wheezing.   Cardiovascular:  Negative for chest pain and palpitations.  Gastrointestinal:  Negative for abdominal pain and nausea.  Genitourinary:  Negative for dysuria, flank pain and frequency.  Musculoskeletal:  Negative for back pain, joint swelling and myalgias.  Neurological:  Negative for dizziness and headaches.  Hematological:  Negative for adenopathy. Does not bruise/bleed easily.  Psychiatric/Behavioral:  Positive for dysphoric  mood. Negative for agitation, behavioral problems, self-injury and suicidal ideas. The patient is nervous/anxious.     Past Medical History:  Diagnosis Date   Allergy    Anemia    Anxiety    Arthritis    Thumbs   Blood transfusion without reported diagnosis    Depression    Diabetes mellitus without complication ([place])    type 2   GERD (gastroesophageal reflux disease)    Hyperlipidemia    Hypertension    Morbid obesity with BMI of 50.0-59.9, adult (HCC)    Osteoporosis    Pneumonia    Substance abuse ([place])    ETOH abuse, quit drinking in 1985     Social History   Socioeconomic History   Marital status: Married    Spouse name: Not on file   Number of children: Not on file   Years of education: Not on file   Highest education level: Not on file  Occupational History   Not on file  Tobacco Use   Smoking status: Never    Passive exposure: Yes   Smokeless tobacco: Never  Vaping Use   Vaping Use: Never used  Substance and Sexual Activity   Alcohol use: Not Currently    Comment: 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   Drug use: Not Currently   Sexual activity: Not on file    Comment: Hysterectomy  Other Topics Concern   Not on file  Social History Narrative   Not on file   Social  Determinants of Health   Financial Resource Strain: Not on file  Food Insecurity: Not on file  Transportation Needs: Not on file  Physical Activity: Not on file  Stress: Not on file  Social Connections: Not on file  Intimate Partner Violence: Not on file    Past Surgical History:  Procedure Laterality Date   ABDOMINAL HYSTERECTOMY     CERVIX REMOVAL     CESAREAN SECTION     COLONOSCOPY     OOPHORECTOMY     UPPER GASTROINTESTINAL ENDOSCOPY     WISDOM TOOTH EXTRACTION      Family History  Problem Relation Age of Onset   Heart disease Father        After the age of 73   Uterine cancer  Mother    Colon cancer Neg Hx    Esophageal cancer Neg Hx    Rectal cancer Neg Hx    Stomach cancer Neg Hx     Allergies  Allergen Reactions   Penicillins Rash    Has patient had a PCN reaction causing immediate rash, facial/tongue/throat swelling, SOB or lightheadedness with hypotension: Yes Has patient had a PCN reaction causing severe rash involving mucus membranes or skin necrosis: No Has patient had a PCN reaction that required hospitalization: No Has patient had a PCN reaction occurring within the last 10 years: No If all of the above answers are "NO", then may proceed with Cephalosporin use.    Current Outpatient Medications on File Prior to Visit  Medication Sig Dispense Refill   ALPRAZolam (XANAX) 0.5 MG tablet Take 1 tablet (0.5 mg total) by mouth 2 (two) times daily as needed for anxiety. 8 tablet 0   amLODipine (NORVASC) 2.5 MG tablet Take 1 tablet (2.5 mg total) by mouth daily. 30 tablet 3   ammonium lactate (LAC-HYDRIN) 12 % lotion Apply twice a day 400 g 0   fluticasone (FLONASE) 50 MCG/ACT nasal spray Place 2 sprays into both nostrils daily. 16 g 1   hydrochlorothiazide (HYDRODIURIL) 25 MG tablet Take 1 tablet by mouth once daily 90 tablet 0   ibuprofen (ADVIL) 600 MG tablet TAKE 1 TABLET BY MOUTH EVERY 8 HOURS AS NEEDED 30 tablet 0   meclizine (ANTIVERT) 12.5 MG tablet Take 1 tablet (12.5 mg total) by mouth 3 (three) times daily as needed for dizziness. 30 tablet 0   No current facility-administered medications on file prior to visit.    BP (!) 132/50    Pulse 95    Temp 98.2 F (36.8 C)    Resp 18    Ht 4\' 10"  (1.473 m)    Wt 223 lb 12.8 oz (101.5 kg)    SpO2 100%    BMI 46.77 kg/m       Objective:   Physical Exam  General Mental Status- Alert. General Appearance- Not in acute distress.   Skin General: Color- Normal Color. Moisture- Normal Moisture.  Neck Carotid Arteries- Normal color. Moisture- Normal Moisture. No carotid bruits. No JVD.  Chest and  Lung Exam Auscultation: Breath Sounds:-Normal.  Cardiovascular Auscultation:Rythm- Regular. Murmurs & Other Heart Sounds:Auscultation of the heart reveals- No Murmurs.  Abdomen Inspection:-Inspeection Normal. Palpation/Percussion:Note:No mass. Palpation and Percussion of the abdomen reveal- Non Tender, Non Distended + BS, no rebound or guarding.   Neurologic Cranial Nerve exam:- CN III-XII intact(No nystagmus), symmetric smile. Strength:- 5/5 equal and symmetric strength both upper and lower extremities.       Assessment & Plan:   Patient Instructions  Htn- your blood  pressure is well controlled today. Continue hctz. I do want you to check bp at home and make sure less than 140/90 consisentently.  Elevated sugar. Please start to check sugars as we discussed.  Depression and some anxiety. Continue with counseling. Will give additional 8 tab rx of xanax for anxiety. Can give antidepressant med if you decide you want.   Rt hand numbness and pain with radicular pain from the neck pain. Finding on xray that indicate probable cervical spine cause. Rx gabapentin. Rx advisement given. Titrating up explained. Also some + wrist manipulation test so possible cts. Can use wrist cock up splint at night.  Follow up in 1 month or sooner if needed.   Mackie Pai, PA-C

## 2020-12-28 NOTE — Patient Instructions (Addendum)
Htn- your blood pressure is well controlled today. Continue hctz. I do want you to check bp at home and make sure less than 140/90 consisentently.  Elevated sugar. Please start to check sugars as we discussed.  Depression and some anxiety. Continue with counseling. Will give additional 8 tab rx of xanax for anxiety. Can give antidepressant med if you decide you want.   Rt hand numbness and pain with radicular pain from the neck pain. Finding on xray that indicate probable cervical spine cause. Rx gabapentin. Rx advisement given. Titrating up explained. Also some + wrist manipulation test so possible cts. Can use wrist cock up splint at night.  Low vit d. Repeat level today.  Follow up in 1 month or sooner if needed.

## 2021-01-01 LAB — VITAMIN D 1,25 DIHYDROXY
Vitamin D 1, 25 (OH)2 Total: 35 pg/mL (ref 18–72)
Vitamin D2 1, 25 (OH)2: 24 pg/mL
Vitamin D3 1, 25 (OH)2: 11 pg/mL

## 2021-01-30 ENCOUNTER — Encounter: Payer: Self-pay | Admitting: Medical

## 2021-01-30 ENCOUNTER — Ambulatory Visit (INDEPENDENT_AMBULATORY_CARE_PROVIDER_SITE_OTHER): Payer: Medicare PPO | Admitting: Medical

## 2021-01-30 ENCOUNTER — Other Ambulatory Visit: Payer: Self-pay | Admitting: Medical

## 2021-01-30 VITALS — BP 140/80 | HR 94 | Resp 18 | Ht <= 58 in | Wt 220.6 lb

## 2021-01-30 DIAGNOSIS — R739 Hyperglycemia, unspecified: Secondary | ICD-10-CM

## 2021-01-30 DIAGNOSIS — M25561 Pain in right knee: Secondary | ICD-10-CM

## 2021-01-30 DIAGNOSIS — M25562 Pain in left knee: Secondary | ICD-10-CM

## 2021-01-30 DIAGNOSIS — F419 Anxiety disorder, unspecified: Secondary | ICD-10-CM | POA: Diagnosis not present

## 2021-01-30 DIAGNOSIS — F32A Depression, unspecified: Secondary | ICD-10-CM | POA: Diagnosis not present

## 2021-01-30 DIAGNOSIS — I10 Essential (primary) hypertension: Secondary | ICD-10-CM | POA: Diagnosis not present

## 2021-01-30 MED ORDER — IBUPROFEN 600 MG PO TABS: 600.0000 mg | ORAL_TABLET | Freq: Three times a day (TID) | ORAL | 0 refills | Status: DC | PRN

## 2021-01-30 NOTE — Progress Notes (Signed)
Subjective:    Patient ID: Amber Huffman, female    DOB: 29-Jun-1950, 71 y.o.   MRN: [id number]  HPI  Pt in for follow up.  Pt admits still struggling at times with depression and anxiety. Pt is continuing with counselor but her counselor is retiring this month. I had offered antidepressant. Pt declined. Did make limited number of xanax. She still has some tabs left.  Pt admits that her diet is not the best. She states eating excessively.   Her bp has been elevated. I had advised to take amlodipine 2.5 mg daily I addition to her hctz which she has been on for years. She has started to take hctz regularly.   Elevated blood sugar. Borderline level.  Pt has joint pain occasionally. She states will take ibuprofen occasionally.   Pt mentions some very transient slight sharp pain to her rt eye. Lasted for seconds then went away. No fb injury, no vison changes, no light, no light flashes or black sports. Pt wears glasses. Has been some time since she saw optometrist.    Review of Systems  Constitutional:  Negative for chills, diaphoresis, fatigue and fever.  Respiratory:  Negative for cough, chest tightness, shortness of breath and wheezing.   Cardiovascular:  Negative for chest pain and palpitations.  Gastrointestinal:  Negative for abdominal pain, anal bleeding, constipation, diarrhea, nausea and vomiting.  Genitourinary:  Negative for difficulty urinating, dysuria and frequency.  Musculoskeletal:  Negative for arthralgias and back pain.  Skin:  Negative for rash.  Neurological:  Negative for dizziness, seizures, weakness, numbness and headaches.  Hematological:  Negative for adenopathy. Does not bruise/bleed easily.  Psychiatric/Behavioral:  Positive for dysphoric mood. Negative for behavioral problems, decreased concentration, self-injury, sleep disturbance and suicidal ideas. The patient is nervous/anxious.     Past Medical History:  Diagnosis Date   Allergy    Anemia    Anxiety     Arthritis    Thumbs   Blood transfusion without reported diagnosis    Depression    Diabetes mellitus without complication ([place])    type 2   GERD (gastroesophageal reflux disease)    Hyperlipidemia    Hypertension    Morbid obesity with BMI of 50.0-59.9, adult (HCC)    Osteoporosis    Pneumonia    Substance abuse ([place])    ETOH abuse, quit drinking in 1985     Social History   Socioeconomic History   Marital status: Married    Spouse name: Not on file   Number of children: Not on file   Years of education: Not on file   Highest education level: Not on file  Occupational History   Not on file  Tobacco Use   Smoking status: Never    Passive exposure: Yes   Smokeless tobacco: Never  Vaping Use   Vaping Use: Never used  Substance and Sexual Activity   Alcohol use: Not Currently    Comment: 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   Drug use: Not Currently   Sexual activity: Not on file    Comment: Hysterectomy  Other Topics Concern   Not on file  Social History Narrative   Not on file   Social Determinants of Health   Financial Resource Strain: Not on file  Food Insecurity: Not on file  Transportation Needs: Not on file  Physical Activity: Not on file  Stress: Not on file  Social Connections: Not on file  Intimate Partner Violence: Not on file    Past Surgical History:  Procedure Laterality Date   ABDOMINAL HYSTERECTOMY     CERVIX REMOVAL     CESAREAN SECTION     COLONOSCOPY     OOPHORECTOMY     UPPER GASTROINTESTINAL ENDOSCOPY     WISDOM TOOTH EXTRACTION      Family History  Problem Relation Age of Onset   Heart disease Father        After the age of 69   Uterine cancer Mother    Colon cancer Neg Hx    Esophageal cancer Neg Hx    Rectal cancer Neg Hx    Stomach cancer Neg Hx     Allergies  Allergen Reactions   Penicillins Rash    Has patient had a PCN  reaction causing immediate rash, facial/tongue/throat swelling, SOB or lightheadedness with hypotension: Yes Has patient had a PCN reaction causing severe rash involving mucus membranes or skin necrosis: No Has patient had a PCN reaction that required hospitalization: No Has patient had a PCN reaction occurring within the last 10 years: No If all of the above answers are "NO", then may proceed with Cephalosporin use.    Current Outpatient Medications on File Prior to Visit  Medication Sig Dispense Refill   ALPRAZolam (XANAX) 0.5 MG tablet Take 1 tablet (0.5 mg total) by mouth 2 (two) times daily as needed for anxiety. 8 tablet 0   amLODipine (NORVASC) 2.5 MG tablet Take 1 tablet (2.5 mg total) by mouth daily. 30 tablet 3   ammonium lactate (LAC-HYDRIN) 12 % lotion Apply twice a day 400 g 0   fluticasone (FLONASE) 50 MCG/ACT nasal spray Place 2 sprays into both nostrils daily. 16 g 1   gabapentin (NEURONTIN) 100 MG capsule Take 1 capsule (100 mg total) by mouth 3 (three) times daily. 90 capsule 3   hydrochlorothiazide (HYDRODIURIL) 25 MG tablet Take 1 tablet by mouth once daily 90 tablet 0   ibuprofen (ADVIL) 600 MG tablet TAKE 1 TABLET BY MOUTH EVERY 8 HOURS AS NEEDED 30 tablet 0   meclizine (ANTIVERT) 12.5 MG tablet Take 1 tablet (12.5 mg total) by mouth 3 (three) times daily as needed for dizziness. 30 tablet 0   No current facility-administered medications on file prior to visit.    BP (!) 158/81    Pulse 94    Resp 18    Ht 4\' 10"  (1.473 m)    Wt 220 lb 9.6 oz (100.1 kg)    SpO2 98%    BMI 46.11 kg/m        Objective:   Physical Exam  General Mental Status- Alert. General Appearance- Not in acute distress.   Skin General: Color- Normal Color. Moisture- Normal Moisture.  Neck Carotid Arteries- Normal color. Moisture- Normal Moisture. No carotid bruits. No JVD.  Chest and Lung Exam Auscultation: Breath Sounds:-Normal.  Cardiovascular Auscultation:Rythm- Regular. Murmurs  & Other Heart Sounds:Auscultation of the heart reveals- No Murmurs.  Abdomen Inspection:-Inspeection Normal. Palpation/Percussion:Note:No mass. Palpation and Percussion of the abdomen reveal- Non Tender, Non Distended + BS, no rebound or guarding.   Neurologic Cranial Nerve exam:- CN III-XII intact(No nystagmus), symmetric smile. Strength:- 5/5 equal and symmetric strength both upper and lower extremities.       Assessment & Plan:   Patient Instructions  Your blood pressure is better on recheck 140/80 but borderline. Continue hctz and do want you to start the low dose amlodipine 2.5  mg daily.  For anxiety and depression. Can continue xanax sparingly. Let me know if you want me to prescribe antidepressant.  For elevated sugar advise low sugar diet.  For knee pain refilled your ibuprofen. But keep in mind can increase bp level. So take sparingly and make sure taking the amlodipine.  For rt eye pain transient and sharp the other day recommend getting in with optometrist for dilated eye exam.  Follow up in one month or sooner if needed.   Mackie Pai, PA-C

## 2021-01-30 NOTE — Patient Instructions (Addendum)
Your blood pressure is better on recheck 140/80 but borderline. Continue hctz and do want you to start the low dose amlodipine 2.5 mg daily.  For anxiety and depression. Can continue xanax sparingly. Let me know if you want me to prescribe antidepressant.  For elevated sugar advise low sugar diet.  For knee pain refilled your ibuprofen. But keep in mind can increase bp level. So take sparingly and make sure taking the amlodipine.  For rt eye pain transient and sharp the other day recommend getting in with optometrist for dilated eye exam.  Follow up in one month or sooner if needed.

## 2021-02-26 ENCOUNTER — Other Ambulatory Visit: Payer: Self-pay

## 2021-02-26 ENCOUNTER — Ambulatory Visit (INDEPENDENT_AMBULATORY_CARE_PROVIDER_SITE_OTHER): Payer: Medicare PPO | Admitting: Medical

## 2021-02-26 ENCOUNTER — Ambulatory Visit (HOSPITAL_BASED_OUTPATIENT_CLINIC_OR_DEPARTMENT_OTHER)
Admission: RE | Admit: 2021-02-26 | Discharge: 2021-02-26 | Disposition: A | Discharge status: Home / Self Care | Payer: Medicare PPO | Source: Ambulatory Visit | Attending: Medical | Admitting: Medical

## 2021-02-26 VITALS — BP 150/85 | HR 100 | Resp 18 | Ht <= 58 in | Wt 222.2 lb

## 2021-02-26 DIAGNOSIS — M25571 Pain in right ankle and joints of right foot: Secondary | ICD-10-CM

## 2021-02-26 DIAGNOSIS — I10 Essential (primary) hypertension: Secondary | ICD-10-CM | POA: Diagnosis not present

## 2021-02-26 DIAGNOSIS — F419 Anxiety disorder, unspecified: Secondary | ICD-10-CM | POA: Diagnosis not present

## 2021-02-26 DIAGNOSIS — F32A Depression, unspecified: Secondary | ICD-10-CM | POA: Diagnosis not present

## 2021-02-26 MED ORDER — LOSARTAN POTASSIUM 25 MG PO TABS: 25.0000 mg | ORAL_TABLET | Freq: Every day | ORAL | 0 refills | Status: DC
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
## 2021-02-27 ENCOUNTER — Ambulatory Visit: Payer: Medicare PPO | Admitting: Medical

## 2021-03-27 ENCOUNTER — Ambulatory Visit: Payer: Medicare PPO | Admitting: Medical

## 2021-03-27 DIAGNOSIS — K1379 Other lesions of oral mucosa: Secondary | ICD-10-CM | POA: Diagnosis not present

## 2021-03-28 ENCOUNTER — Ambulatory Visit: Payer: Medicare PPO | Admitting: Medical

## 2021-03-28 ENCOUNTER — Encounter: Payer: Self-pay | Admitting: Medical

## 2021-03-28 VITALS — BP 139/83 | HR 85 | Resp 18 | Ht <= 58 in | Wt 216.2 lb

## 2021-03-28 DIAGNOSIS — I10 Essential (primary) hypertension: Secondary | ICD-10-CM

## 2021-03-28 DIAGNOSIS — F419 Anxiety disorder, unspecified: Secondary | ICD-10-CM | POA: Diagnosis not present

## 2021-03-28 DIAGNOSIS — E119 Type 2 diabetes mellitus without complications: Secondary | ICD-10-CM

## 2021-03-28 DIAGNOSIS — E559 Vitamin D deficiency, unspecified: Secondary | ICD-10-CM | POA: Diagnosis not present

## 2021-03-28 DIAGNOSIS — H9313 Tinnitus, bilateral: Secondary | ICD-10-CM

## 2021-03-28 LAB — HEMOGLOBIN A1C: Hgb A1c MFr Bld: 6.5 % (ref 4.6–6.5)

## 2021-03-28 LAB — COMPREHENSIVE METABOLIC PANEL
ALT: 11 U/L (ref 0–35)
AST: 15 U/L (ref 0–37)
Albumin: 4.2 g/dL (ref 3.5–5.2)
Alkaline Phosphatase: 82 U/L (ref 39–117)
BUN: 21 mg/dL (ref 6–23)
CO2: 35 mEq/L — ABNORMAL HIGH (ref 19–32)
Calcium: 9.3 mg/dL (ref 8.4–10.5)
Chloride: 98 mEq/L (ref 96–112)
Creatinine, Ser: 0.85 mg/dL (ref 0.40–1.20)
GFR: 68.08 mL/min (ref >60.00)
Glucose, Bld: 110 mg/dL — ABNORMAL HIGH (ref 70–99)
Potassium: 3.7 mEq/L (ref 3.5–5.1)
Sodium: 139 mEq/L (ref 135–145)
Total Bilirubin: 0.4 mg/dL (ref 0.2–1.2)
Total Protein: 7 g/dL (ref 6.0–8.3)

## 2021-03-28 NOTE — Progress Notes (Signed)
? ?Subjective:  ? ? Patient ID: Amber Huffman, female    DOB: Sep 01, 1950, 71 y.o.   MRN: [id number] ? ?HPI ? ?Pt in for follow up. ? ?Pt states she just went to [facility]. Sounds like want her to change her care.  ? ?Anxiety flare recently associated with son death. Pt state anniversary of death coming up. She will see if needs but presently dose not need refill. ? ?Htn- pt is on amlodipine 2.5 mg daily and hctz 25 mg daily. Pt sister who is cardiac nurse is telling her losartan better then amlodipine. ? ?Pt is diabetic by a1c.  ? ?Pt has 3 years of tinnitus. In past very low level minimal. Over past month louder hum.  ? ? ? ?Review of Systems  ?Constitutional:  Negative for chills, fatigue and fever.  ?HENT:    ?     Ringing both ears.  ?Respiratory:  Negative for cough, chest tightness, shortness of breath and wheezing.   ?Cardiovascular:  Negative for chest pain and palpitations.  ?Gastrointestinal:  Negative for abdominal pain and blood in stool.  ?Musculoskeletal:  Negative for back pain.  ?Skin:  Negative for rash.  ?Neurological:  Negative for dizziness, light-headedness, numbness and headaches.  ?Hematological:  Negative for adenopathy. Does not bruise/bleed easily.  ?Psychiatric/Behavioral:  Negative for behavioral problems and confusion.   ? ?Past Medical History:  ?Diagnosis Date  ? Allergy   ? Anemia   ? Anxiety   ? Arthritis   ? Thumbs  ? Blood transfusion without reported diagnosis   ? Depression   ? Diabetes mellitus without complication ([place])   ? type 2  ? GERD (gastroesophageal reflux disease)   ? Hyperlipidemia   ? Hypertension   ? Morbid obesity with BMI of 50.0-59.9, adult ([place])   ? Osteoporosis   ? Pneumonia   ? Substance abuse ([place])   ? ETOH abuse, quit drinking in 1985  ? ?  ?Social History  ? ?Socioeconomic History  ? Marital status: Married  ?  Spouse name: Not on file  ? Number of children: Not on file  ? Years of education: Not on file  ? Highest education level: Not on file  ?Occupational  History  ? Not on file  ?Tobacco Use  ? Smoking status: Never  ?  Passive exposure: Yes  ? Smokeless tobacco: Never  ?Vaping Use  ? Vaping Use: Never used  ?Substance and Sexual Activity  ? Alcohol use: Not Currently  ?  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  ? Drug use: Not Currently  ? Sexual activity: Not on file  ?  Comment: Hysterectomy  ?Other Topics Concern  ? Not on file  ?Social History Narrative  ? Not on file  ? ?Social Determinants of Health  ? ?Financial Resource Strain: Not on file  ?Food Insecurity: Not on file  ?Transportation Needs: Not on file  ?Physical Activity: Not on file  ?Stress: Not on file  ?Social Connections: Not on file  ?Intimate Partner Violence: Not on file  ? ? ?Past Surgical History:  ?Procedure Laterality Date  ? ABDOMINAL HYSTERECTOMY    ? CERVIX REMOVAL    ? CESAREAN SECTION    ? COLONOSCOPY    ? OOPHORECTOMY    ? UPPER GASTROINTESTINAL ENDOSCOPY    ? WISDOM TOOTH EXTRACTION    ? ? ?Family History  ?Problem Relation Age of Onset  ?  Heart disease Father   ?     After the age of 19  ? Uterine cancer Mother   ? Colon cancer Neg Hx   ? Esophageal cancer Neg Hx   ? Rectal cancer Neg Hx   ? Stomach cancer Neg Hx   ? ? ?Allergies  ?Allergen Reactions  ? Penicillins Rash  ?  Has patient had a PCN reaction causing immediate rash, facial/tongue/throat swelling, SOB or lightheadedness with hypotension: Yes ?Has patient had a PCN reaction causing severe rash involving mucus membranes or skin necrosis: No ?Has patient had a PCN reaction that required hospitalization: No ?Has patient had a PCN reaction occurring within the last 10 years: No ?If all of the above answers are "NO", then may proceed with Cephalosporin use.  ? ? ?Current Outpatient Medications on File Prior to Visit  ?Medication Sig Dispense Refill  ? ALPRAZolam (XANAX) 0.5 MG tablet Take 1 tablet (0.5 mg total) by mouth 2 (two) times  daily as needed for anxiety. 8 tablet 0  ? amLODipine (NORVASC) 2.5 MG tablet Take 1 tablet (2.5 mg total) by mouth daily. 30 tablet 3  ? ammonium lactate (LAC-HYDRIN) 12 % lotion Apply twice a day 400 g 0  ? fluticasone (FLONASE) 50 MCG/ACT nasal spray Place 2 sprays into both nostrils daily. 16 g 1  ? gabapentin (NEURONTIN) 100 MG capsule Take 1 capsule (100 mg total) by mouth 3 (three) times daily. 90 capsule 3  ? hydrochlorothiazide (HYDRODIURIL) 25 MG tablet Take 1 tablet by mouth once daily 90 tablet 0  ? ibuprofen (ADVIL) 600 MG tablet TAKE 1 TABLET BY MOUTH EVERY 8 HOURS AS NEEDED 30 tablet 0  ? ibuprofen (ADVIL) 600 MG tablet Take 1 tablet (600 mg total) by mouth every 8 (eight) hours as needed. 30 tablet 0  ? losartan (COZAAR) 25 MG tablet Take 1 tablet (25 mg total) by mouth daily. 30 tablet 0  ? meclizine (ANTIVERT) 12.5 MG tablet Take 1 tablet (12.5 mg total) by mouth 3 (three) times daily as needed for dizziness. 30 tablet 0  ? ?No current facility-administered medications on file prior to visit.  ? ? ?BP (!) 142/84   Pulse 85   Resp 18   Ht '4\' 10"'$  (1.473 m)   Wt 216 lb 3.2 oz (98.1 kg)   SpO2 96%   BMI 45.19 kg/m?  ?  ? ?   ?Objective:  ? Physical Exam ? ?General ?Mental Status- Alert. General Appearance- Not in acute distress.  ? ?Skin ?General: Color- Normal Color. Moisture- Normal Moisture. ? ?Neck ?Carotid Arteries- Normal color. Moisture- Normal Moisture. No carotid bruits. No JVD. ? ?Chest and Lung Exam ?Auscultation: ?Breath Sounds:-Normal. ? ?Cardiovascular ?Auscultation:Rythm- Regular. ?Murmurs & Other Heart Sounds:Auscultation of the heart reveals- No Murmurs. ? ?Abdomen ?Inspection:-Inspeection Normal. ?Palpation/Percussion:Note:No mass. Palpation and Percussion of the abdomen reveal- Non Tender, Non Distended + BS, no rebound or guarding. ? ? ?Neurologic ?Cranial Nerve exam:- CN III-XII intact(No nystagmus), symmetric smile. ?Finger to Nose:- Normal/Intact ?Strength:- 5/5 equal and  symmetric strength both upper and lower extremities.  ? ? ?   ?Assessment & Plan:  ? ?Patient Instructions  ?Htn- bp high and would prefer bp closer to 130/80. You presently prefer to start losartan and continue diuretic. Can stop amlodipine/taper off as discussed. With recent a1c I do think reasonable approach. May eventually need losart 50 mg dose but start at 25 mg daily. ? ?Anxiety controlled but let me know if you need refill of xanax.(Brief  lower number refill) ? ?Diabetes- check a1c and cmp today. Check sugar twice daily at home. Med declined presently. ? ?Vit deficiency. Recheck vit d level today. ? ?Chronic tinnitus for year. If you want me to refer to ENT let me know. ? ?Follow 2-3 weeks or sooner if needed.  ? ?Mackie Pai, PA-C  ? ? ?

## 2021-03-28 NOTE — Patient Instructions (Addendum)
Htn- bp high and would prefer bp closer to 130/80. You presently prefer to start losartan and continue diuretic. Can stop amlodipine/taper off as discussed. With recent a1c I do think reasonable approach. May eventually need losart 50 mg dose but start at 25 mg daily. ? ?Anxiety controlled but let me know if you need refill of xanax.(Brief lower number refill) ? ?Diabetes- check a1c and cmp today. Check sugar twice daily at home. Med declined presently. ? ?Vit deficiency. Recheck vit d level today. ? ?Chronic tinnitus for year. If you want me to refer to ENT let me know. ? ?Follow 2-3 weeks or sooner if needed. ?

## 2021-03-28 NOTE — Progress Notes (Signed)
fol

## 2021-03-29 ENCOUNTER — Telehealth: Payer: Self-pay

## 2021-03-29 NOTE — Telephone Encounter (Signed)
Pt has called back to get lab results. I have received a warm transfer from [facility]. Lab results given to pt and answered all her questions. Letter mailed to pt per her request.  ? ?Thanks,  ?Johnthomas Lader ?

## 2021-03-31 ENCOUNTER — Other Ambulatory Visit: Payer: Self-pay | Admitting: Medical

## 2021-04-01 MED ORDER — AMLODIPINE BESYLATE 2.5 MG PO TABS
2.5000 mg | ORAL_TABLET | Freq: Every day | ORAL | 0 refills | Status: DC
Start: 2021-04-01 — End: 2021-04-04

## 2021-04-01 NOTE — Telephone Encounter (Signed)
Pt called in states that she needs one more refill of amlodipine before she starts in the losartan  ?

## 2021-04-01 NOTE — Addendum Note (Signed)
Addended by: Anabel Halon on: 04/01/2021 12:40 PM ? ? Modules accepted: Orders ? ?

## 2021-04-02 LAB — VITAMIN D 1,25 DIHYDROXY
Vitamin D 1, 25 (OH)2 Total: 50 pg/mL (ref 18–72)
Vitamin D2 1, 25 (OH)2: 37 pg/mL
Vitamin D3 1, 25 (OH)2: 13 pg/mL

## 2021-04-03 ENCOUNTER — Ambulatory Visit (HOSPITAL_BASED_OUTPATIENT_CLINIC_OR_DEPARTMENT_OTHER): Payer: Medicare PPO | Admitting: Family

## 2021-04-04 ENCOUNTER — Ambulatory Visit (HOSPITAL_BASED_OUTPATIENT_CLINIC_OR_DEPARTMENT_OTHER): Payer: Medicare PPO | Admitting: Family

## 2021-04-04 ENCOUNTER — Other Ambulatory Visit: Payer: Self-pay

## 2021-04-04 ENCOUNTER — Encounter (HOSPITAL_BASED_OUTPATIENT_CLINIC_OR_DEPARTMENT_OTHER): Payer: Self-pay | Admitting: Family

## 2021-04-04 VITALS — BP 149/84 | HR 97 | Ht 58.5 in | Wt 220.1 lb

## 2021-04-04 DIAGNOSIS — R7303 Prediabetes: Secondary | ICD-10-CM | POA: Diagnosis not present

## 2021-04-04 DIAGNOSIS — I10 Essential (primary) hypertension: Secondary | ICD-10-CM

## 2021-04-04 NOTE — Patient Instructions (Signed)
Medication Instructions:  ?Your physician has recommended you make the following change in your medication:  ? ?STOP Amlodipine ? ?START Losartan one 25 mg tablet daily  ? ?*If you need a refill on your cardiac medications before your next appointment, please call your pharmacy* ? ?Lab Work: ?None ordered today.  ? ?Testing/Procedures: ?Your EKG today showed normal sinus rhythm.  ? ? ?Follow-Up: ?At [facility], you and your health needs are our priority.  As part of our continuing mission to provide you with exceptional heart care, we have created designated Provider Care Teams.  These Care Teams include your primary Cardiologist (physician) and Advanced Practice Providers (APPs -  Physician Assistants and Nurse Practitioners) who all work together to provide you with the care you need, when you need it. ? ?We recommend signing up for the patient portal called "MyChart".  Sign up information is provided on this After Visit Summary.  MyChart is used to connect with patients for Virtual Visits (Telemedicine).  Patients are able to view lab/test results, encounter notes, upcoming appointments, etc.  Non-urgent messages can be sent to your provider as well.   ?To learn more about what you can do with MyChart, go to [email or web address].   ? ?Your next appointment:   ?In July as scheduled with Dr. Oval Linsey ? ? ?Other Instructions ? ?We have referred you to the PREP program at the [facility].  ? ?Tips to Measure your Blood Pressure Correctly ? ?To determine whether you have hypertension, a medical professional will take a blood pressure reading. How you prepare for the test, the position of your arm, and other factors can change a blood pressure reading by 10% or more. That could be enough to hide high blood pressure, start you on a drug you don't really need, or lead your doctor to incorrectly adjust your medications. ? ?National and international guidelines offer specific instructions for measuring blood pressure. If a  doctor, nurse, or medical assistant isn't doing it right, don't hesitate to ask him or her to get with the guidelines. ? ?Here's what you can do to ensure a correct reading: ? Don't drink a caffeinated beverage or smoke during the 30 minutes before the test. ? Sit quietly for five minutes before the test begins. ? During the measurement, sit in a chair with your feet on the floor and your arm supported so your elbow is at about heart level. ? The inflatable part of the cuff should completely cover at least 80% of your upper arm, and the cuff should be placed on bare skin, not over a shirt. ? Don't talk during the measurement. ? ?Blood pressure categories  ?Blood pressure category SYSTOLIC ?(upper number)  DIASTOLIC ?(lower number)  ?Normal Less than 120 mm Hg and Less than 80 mm Hg  ?Elevated 120-129 mm Hg and Less than 80 mm Hg  ?High blood pressure: Stage 1 hypertension 130-139 mm Hg or 80-89 mm Hg  ?High blood pressure: Stage 2 hypertension 140 mm Hg or higher or 90 mm Hg or higher  ?Hypertensive crisis (consult your doctor immediately) Higher than 180 mm Hg and/or Higher than 120 mm Hg  ?Source: American Heart Association and American Stroke Association. ?For more on getting your blood pressure under control, buy Controlling Your Blood Pressure, a Special Health Report from [facility]. ? ? ?Blood Pressure Log ? ? ?Date ?  ?Time  ?Blood Pressure  ?Example: Nov 1 9 AM 124/78  ? ?    ? ?    ? ?    ? ?    ? ?    ? ?    ? ?    ? ?    ? ? ?  ?

## 2021-04-04 NOTE — Progress Notes (Signed)
? ?Office Visit  ?  ?Patient Name: Amber Huffman ?Date of Encounter: 04/05/2021 ? ?PCP:  Mackie Pai, PA-C ?  ?[place]  ?Cardiologist:  Skeet Latch, MD  ?Advanced Practice Provider:  No care team member to display ?Electrophysiologist:  None  ? ? ?Chief Complaint  ?  ?Amber Huffman is a 71 y.o. female with a hx of obesity, hypertension, hyperlipidemia presents today for hypertension follow up.   ? ?Past Medical History  ?  ?Past Medical History:  ?Diagnosis Date  ? Allergy   ? Anemia   ? Anxiety   ? Arthritis   ? Thumbs  ? Blood transfusion without reported diagnosis   ? Depression   ? Diabetes mellitus without complication ([place])   ? type 2  ? GERD (gastroesophageal reflux disease)   ? Hyperlipidemia   ? Hypertension   ? Morbid obesity with BMI of 50.0-59.9, adult ([place])   ? Osteoporosis   ? Pneumonia   ? Substance abuse ([place])   ? ETOH abuse, quit drinking in 1985  ? ?Past Surgical History:  ?Procedure Laterality Date  ? ABDOMINAL HYSTERECTOMY    ? CERVIX REMOVAL    ? CESAREAN SECTION    ? COLONOSCOPY    ? OOPHORECTOMY    ? UPPER GASTROINTESTINAL ENDOSCOPY    ? WISDOM TOOTH EXTRACTION    ? ? ?Allergies ? ?Allergies  ?Allergen Reactions  ? Penicillins Rash  ?  Has patient had a PCN reaction causing immediate rash, facial/tongue/throat swelling, SOB or lightheadedness with hypotension: Yes ?Has patient had a PCN reaction causing severe rash involving mucus membranes or skin necrosis: No ?Has patient had a PCN reaction that required hospitalization: No ?Has patient had a PCN reaction occurring within the last 10 years: No ?If all of the above answers are "NO", then may proceed with Cephalosporin use.  ? ? ?History of Present Illness  ?  ?Amber Huffman is a 71 y.o. female with a hx of obesity, hypertension, hyperlipidemia last seen 02/22/2020. ? ?He was initially seen in the hospital October 2019 after presenting with chest pain and diagnosed with hypertension and hyperlipidemia.   Cardiac enzymes are negative and EKG unremarkable.  Outpatient stress testing no ischemia nor infarction.  Echo LVEF 60 to 79%, diastolic dysfunction.  Did not tolerate atorvastatin due to myalgias.  She had coronary CTA 12/2019 with coronary calcium score of 0.  She was last seen in clinic 02/22/2020 doing overall well from a cardiac perspective.  She has some abdominal discomfort due to gallbladder.  She was grieving the loss of her sister from COVID-39. ? ?She presents today for follow-up.  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  Shares with me that she is still grieving the loss of her sister and that since she saw Dr. Oval Linsey she lost her son to a very fast 6 weeks of a lung cancer nearly 1 year ago.  April 13 will be the anniversary of her son's passing.  She does have good support system and her sisters and her church.  She saw PCP 03/28/2021 with elevated blood pressure without prefer to remain on fewer agents overall.  He was recommended to discontinue amlodipine and start losartan.  Has not made the change yet.  He denies chest pain, pressure, tightness.  Reports exertional dyspnea with only more than usual activity.  She notes she has picked up weight and is hopeful to get back to some activity to lose weight.  Notes her blood pressure  has been elevated and we reviewed blood pressure goal less than 130/80. ? ?EKGs/Labs/Other Studies Reviewed:  ? ?The following studies were reviewed today: ?Echo 11/12/17: ?Study Conclusions ?  ?- Left ventricle: The cavity size was normal. There was moderate ?  concentric hypertrophy. Systolic function was normal. The ?  estimated ejection fraction was in the range of 60% to 65%. Wall ?  motion was normal; there were no regional wall motion ?  abnormalities. There was an increased relative contribution of ?  atrial contraction to ventricular filling. Doppler parameters are ?  consistent with abnormal left ventricular relaxation (grade 1 ?  diastolic dysfunction). ?- Mitral  valve: Calcified annulus. There was trivial regurgitation. ?  ?Lexiscan Myoview 11/13/17: ?Nuclear stress EF: 81%. ?The left ventricular ejection fraction is hyperdynamic (>65%). ?There was no ST segment deviation noted during stress. ?The study is normal. ?This is a low risk study. ?  ?Normal resting and stress perfusion. No ischemia or infarction EF ?81% ?  ?Coronary CT-A 12/21: ?IMPRESSION: ?1. Coronary calcium score of 0. This was 0 percentile for age and ?sex matched control. ?  ?2. Normal coronary origin with right dominance. ?  ?3. No evidence of CAD. ?  ?Carotid Doppler 09/2019: ?IMPRESSION: ?1. Bilateral carotid bifurcation plaque resulting in less than 50% ?diameter ICA stenosis. ?2. Antegrade bilateral vertebral arterial flow. ?3. Hypertension ? ?EKG:  EKG is ordered today.  The ekg ordered today demonstrates NSR 93 bpm with LPFB. No acute ST/T wave changes.  ? ?Recent Labs: ?08/14/2020: Pro B Natriuretic peptide (BNP) 32.0 ?11/13/2020: Hemoglobin 14.5; Platelets 314.0 ?03/28/2021: ALT 11; BUN 21; Creatinine, Ser 0.85; Potassium 3.7; Sodium 139  ?Recent Lipid Panel ?   ?Component Value Date/Time  ? CHOL 238 (H) 02/22/2020 1115  ? TRIG 97 02/22/2020 1115  ? HDL 46 02/22/2020 1115  ? CHOLHDL 5.2 (H) 02/22/2020 1115  ? CHOLHDL 5 02/23/2019 1315  ? VLDL 25.2 02/23/2019 1315  ? LDLCALC 175 (H) 02/22/2020 1115  ? ?Home Medications  ? ?Current Meds  ?Medication Sig  ? hydrochlorothiazide (HYDRODIURIL) 25 MG tablet Take 1 tablet by mouth once daily  ? ibuprofen (ADVIL) 600 MG tablet TAKE 1 TABLET BY MOUTH EVERY 8 HOURS AS NEEDED  ? [DISCONTINUED] amLODipine (NORVASC) 2.5 MG tablet Take 1 tablet (2.5 mg total) by mouth daily.  ?  ? ?Review of Systems  ?    ?All other systems reviewed and are otherwise negative except as noted above. ? ?Physical Exam  ?  ?VS:  BP (!) 149/84   Pulse 97   Ht 4' 10.5" (1.486 m)   Wt 220 lb 1.6 oz (99.8 kg)   BMI 45.22 kg/m?  , BMI Body mass index is 45.22 kg/m?. ? ?Wt Readings from Last  3 Encounters:  ?04/04/21 220 lb 1.6 oz (99.8 kg)  ?03/28/21 216 lb 3.2 oz (98.1 kg)  ?02/26/21 222 lb 3.2 oz (100.8 kg)  ?  ?GEN: Well nourished, overweight, well developed, in no acute distress. ?HEENT: normal. ?Neck: Supple, no JVD, carotid bruits, or masses. ?Cardiac: RRR, no murmurs, rubs, or gallops. No clubbing, cyanosis, edema.  Radials/PT 2+ and equal bilaterally.  ?Respiratory:  Respirations regular and unlabored, clear to auscultation bilaterally. ?GI: Soft, nontender, nondistended. ?MS: No deformity or atrophy. ?Skin: Warm and dry, no rash. ?Neuro:  Strength and sensation are intact. ?Psych: Normal affect. ? ?Assessment & Plan  ?  ?HTN -BP not at goal.  Stop amlodipine.  Start losartan 25 mg daily.  MyChart message to  check in in 1 week.  If BP not at goal of less than 130/80 increase to 50 mg daily.  Consider BMP at that time. Continue HCTZ. Heart healthy diet and regular cardiovascular exercise encouraged.   ? ?Grief -fortunately has a good support system in her sisters and church.  Lost her sister a little over 1 year ago as well as her son.  Follows with support group through hospice. ? ?Pre diabetes /Morbid obesity - Weight loss via diet and exercise encouraged. Discussed the impact being overweight would have on cardiovascular risk.  Referred to prep program for exercise. ? ? ?Disposition: Follow up in 4 month(s) with Skeet Latch, MD or APP. ? ?Signed, ?Loel Dubonnet, NP ?04/05/2021, 3:09 PM ?[place] ?

## 2021-04-05 ENCOUNTER — Ambulatory Visit (HOSPITAL_BASED_OUTPATIENT_CLINIC_OR_DEPARTMENT_OTHER): Payer: Medicare PPO | Admitting: Family

## 2021-04-11 ENCOUNTER — Telehealth (HOSPITAL_BASED_OUTPATIENT_CLINIC_OR_DEPARTMENT_OTHER): Payer: Self-pay

## 2021-04-11 NOTE — Telephone Encounter (Addendum)
Left message for patient to call back ? ? ? ? ? ?----- Message from Loel Dubonnet, NP sent at 04/11/2021  7:43 AM EDT ----- ?Can we please call her to get a log of BP since stopping Amlodipine and starting Losartan last week? TY! ?----- Message ----- ?From: Loel Dubonnet, NP ?Sent: 04/11/2021  12:00 AM EDT ?To: Loel Dubonnet, NP ? ?BP Check in ? ? ?

## 2021-04-12 ENCOUNTER — Telehealth: Payer: Self-pay

## 2021-04-12 NOTE — Telephone Encounter (Signed)
Called to discuss PREP program referral, left voicemail  

## 2021-04-29 ENCOUNTER — Other Ambulatory Visit: Payer: Self-pay | Admitting: Medical

## 2021-04-30 ENCOUNTER — Encounter: Payer: Self-pay | Admitting: Medical

## 2021-04-30 ENCOUNTER — Ambulatory Visit: Payer: Medicare PPO | Admitting: Medical

## 2021-04-30 ENCOUNTER — Telehealth: Payer: Self-pay | Admitting: Medical

## 2021-04-30 VITALS — BP 133/70 | HR 88 | Resp 98 | Ht <= 58 in | Wt 219.2 lb

## 2021-04-30 DIAGNOSIS — F419 Anxiety disorder, unspecified: Secondary | ICD-10-CM | POA: Diagnosis not present

## 2021-04-30 DIAGNOSIS — I10 Essential (primary) hypertension: Secondary | ICD-10-CM | POA: Diagnosis not present

## 2021-04-30 DIAGNOSIS — E119 Type 2 diabetes mellitus without complications: Secondary | ICD-10-CM | POA: Diagnosis not present

## 2021-04-30 MED ORDER — LOSARTAN POTASSIUM 25 MG PO TABS: 25.0000 mg | ORAL_TABLET | Freq: Every day | ORAL | 1 refills | Status: DC

## 2021-04-30 NOTE — Progress Notes (Signed)
? ?Subjective:  ? ? Patient ID: Amber Huffman, female    DOB: 1950-12-30, 71 y.o.   MRN: [id number] ? ?HPI ?Pt in for follow up. ? ?Since last visit with me she saw cardioloigist.  ?A/P ? ?HTN -BP not at goal.  Stop amlodipine.  Start losartan 25 mg daily.  MyChart message to check in in 1 week.  If BP not at goal of less than 130/80 increase to 50 mg daily.  Consider BMP at that time. Continue HCTZ. Heart healthy diet and regular cardiovascular exercise encouraged.   ?  ?Grief -fortunately has a good support system in her sisters and church.  Lost her sister a little over 1 year ago as well as her son.  Follows with support group through hospice. ?  ?Pre diabetes /Morbid obesity - Weight loss via diet and exercise encouraged. Discussed the impact being overweight would have on cardiovascular risk.  Referred to prep program for exercise. ? ? ?Pt was out of town recently and has not been checking her bp routinely. She states her machine at home has not been working well.  ? ?Today her bp is initially high.  ? ?Pt has not been exercising. Pt admits should be taking advantage of silver sneakers and gym membership. ? ? ? ?Review of Systems  ?Constitutional:  Negative for chills, fatigue and fever.  ?Respiratory:  Negative for cough, chest tightness, shortness of breath and wheezing.   ?Cardiovascular:  Negative for chest pain and palpitations.  ?Gastrointestinal:  Negative for abdominal pain and blood in stool.  ?Genitourinary:  Negative for dysuria.  ?Musculoskeletal:  Negative for back pain.  ?Neurological:  Negative for dizziness, seizures and headaches.  ?Hematological:  Negative for adenopathy. Does not bruise/bleed easily.  ?Psychiatric/Behavioral:  The patient is nervous/anxious.   ? ? ?Past Medical History:  ?Diagnosis Date  ? Allergy   ? Anemia   ? Anxiety   ? Arthritis   ? Thumbs  ? Blood transfusion without reported diagnosis   ? Depression   ? Diabetes mellitus without complication ([place])   ? type 2  ? GERD  (gastroesophageal reflux disease)   ? Hyperlipidemia   ? Hypertension   ? Morbid obesity with BMI of 50.0-59.9, adult ([place])   ? Osteoporosis   ? Pneumonia   ? Substance abuse ([place])   ? ETOH abuse, quit drinking in 1985  ? ?  ?Social History  ? ?Socioeconomic History  ? Marital status: Married  ?  Spouse name: Not on file  ? Number of children: Not on file  ? Years of education: Not on file  ? Highest education level: Not on file  ?Occupational History  ? Not on file  ?Tobacco Use  ? Smoking status: Never  ?  Passive exposure: Yes  ? Smokeless tobacco: Never  ?Vaping Use  ? Vaping Use: Never used  ?Substance and Sexual Activity  ? Alcohol use: Not Currently  ?  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  ? Drug use: Not Currently  ? Sexual activity: Not on file  ?  Comment: Hysterectomy  ?Other Topics Concern  ? Not on file  ?Social History Narrative  ? Not on file  ? ?Social Determinants of Health  ? ?Financial Resource Strain: Not on file  ?Food Insecurity: Not on file  ?Transportation Needs: Not on file  ?Physical Activity: Not on file  ?Stress: Not on file  ?  Social Connections: Not on file  ?Intimate Partner Violence: Not on file  ? ? ?Past Surgical History:  ?Procedure Laterality Date  ? ABDOMINAL HYSTERECTOMY    ? CERVIX REMOVAL    ? CESAREAN SECTION    ? COLONOSCOPY    ? OOPHORECTOMY    ? UPPER GASTROINTESTINAL ENDOSCOPY    ? WISDOM TOOTH EXTRACTION    ? ? ?Family History  ?Problem Relation Age of Onset  ? Heart disease Father   ?     After the age of 8  ? Uterine cancer Mother   ? Colon cancer Neg Hx   ? Esophageal cancer Neg Hx   ? Rectal cancer Neg Hx   ? Stomach cancer Neg Hx   ? ? ?Allergies  ?Allergen Reactions  ? Penicillins Rash  ?  Has patient had a PCN reaction causing immediate rash, facial/tongue/throat swelling, SOB or lightheadedness with hypotension: Yes ?Has patient had a PCN reaction causing severe rash  involving mucus membranes or skin necrosis: No ?Has patient had a PCN reaction that required hospitalization: No ?Has patient had a PCN reaction occurring within the last 10 years: No ?If all of the above answers are "NO", then may proceed with Cephalosporin use.  ? ? ?Current Outpatient Medications on File Prior to Visit  ?Medication Sig Dispense Refill  ? ALPRAZolam (XANAX) 0.5 MG tablet Take 1 tablet (0.5 mg total) by mouth 2 (two) times daily as needed for anxiety. (Patient not taking: Reported on 04/04/2021) 8 tablet 0  ? gabapentin (NEURONTIN) 100 MG capsule Take 1 capsule (100 mg total) by mouth 3 (three) times daily. 90 capsule 3  ? hydrochlorothiazide (HYDRODIURIL) 25 MG tablet Take 1 tablet by mouth once daily 90 tablet 0  ? ibuprofen (ADVIL) 600 MG tablet TAKE 1 TABLET BY MOUTH EVERY 8 HOURS AS NEEDED 30 tablet 0  ? losartan (COZAAR) 25 MG tablet Take 1 tablet by mouth once daily 30 tablet 0  ? meclizine (ANTIVERT) 12.5 MG tablet Take 1 tablet (12.5 mg total) by mouth 3 (three) times daily as needed for dizziness. (Patient not taking: Reported on 04/04/2021) 30 tablet 0  ? ?No current facility-administered medications on file prior to visit.  ? ? ?BP 133/70   Pulse 88   Resp (!) 98   Ht '4\' 10"'$  (1.473 m)   Wt 219 lb 3.2 oz (99.4 kg)   BMI 45.81 kg/m?  ?  ?   ?Objective:  ? Physical Exam ? ?General ?Mental Status- Alert. General Appearance- Not in acute distress.  ? ? ?Chest and Lung Exam ?Auscultation: ?Breath Sounds:-Normal. ? ?Cardiovascular ?Auscultation:Rythm- Regular. ?Murmurs & Other Heart Sounds:Auscultation of the heart reveals- No Murmurs. ? ?Abdomen ?Inspection:-Inspeection Normal. ?Palpation/Percussion:Note:No mass. Palpation and Percussion of the abdomen reveal- Non Tender, Non Distended + BS, no rebound or guarding. ? ? ?Neurologic ?Cranial Nerve exam:- CN III-XII intact(No nystagmus), symmetric smile. ?Strength:- 5/5 equal and symmetric strength both upper and lower extremities.  ? ? ?    ?Assessment & Plan:  ? ?Patient Instructions  ?Your blood pressure is very close to goal today of less than 130/80 per cardiologist note. Want you to check bp at home with cuff over next week and update me in one week on those results. Update cardiologist as well. Continue losartan 25 mg daily and hctz 25 mg daily. ? ?For anxiety continue to use xanax if needed. ? ?For diabetes continue with low sugar diet. Presently controlled with no med. ? ?Follow up in 3 months  or sooner if needed.  ?

## 2021-04-30 NOTE — Telephone Encounter (Signed)
Rx refill sent of losartan. ? ?Mackie Pai, PA-C  ?

## 2021-04-30 NOTE — Patient Instructions (Addendum)
Your blood pressure is very close to goal today of less than 130/80 per cardiologist note. Want you to check bp at home with cuff over next week and update me in one week on those results. Update cardiologist as well. Continue losartan 25 mg daily and hctz 25 mg daily. ? ?For anxiety continue to use xanax if needed. ? ?For diabetes continue with low sugar diet. Presently controlled with no med. ? ?Follow up in 3 months or sooner if needed. ?

## 2021-05-07 ENCOUNTER — Other Ambulatory Visit: Payer: Self-pay | Admitting: Medical

## 2021-05-15 ENCOUNTER — Telehealth: Payer: Self-pay | Admitting: Medical

## 2021-05-15 NOTE — Telephone Encounter (Signed)
Left message for patient to call back and schedule Medicare Annual Wellness Visit (AWV) either virtually or phone ? ? ?due 10/14/2018 awvi per [facility]- ?please schedule at anytime with health coach ? ?This should be a 45 minute visit.  ? ?I left my direct number [phone] ?

## 2021-06-17 ENCOUNTER — Ambulatory Visit (INDEPENDENT_AMBULATORY_CARE_PROVIDER_SITE_OTHER): Payer: Medicare PPO

## 2021-06-17 VITALS — Ht <= 58 in | Wt 219.0 lb

## 2021-06-17 DIAGNOSIS — Z780 Asymptomatic menopausal state: Secondary | ICD-10-CM | POA: Diagnosis not present

## 2021-06-17 DIAGNOSIS — Z0000 Encounter for general adult medical examination without abnormal findings: Secondary | ICD-10-CM

## 2021-06-17 NOTE — Progress Notes (Addendum)
Subjective:   Amber Huffman is a 71 y.o. female who presents for an Initial Medicare Annual Wellness Visit.  I connected with [name] today by telephone and verified that I am speaking with the correct person using two identifiers. Location patient: home Location provider: work Persons participating in the virtual visit: patient, Marine scientist.    I discussed the limitations, risks, security and privacy concerns of performing an evaluation and management service by telephone and the availability of in person appointments. I also discussed with the patient that there may be a patient responsible charge related to this service. The patient expressed understanding and verbally consented to this telephonic visit.    Interactive audio and video telecommunications were attempted between this provider and patient, however failed, due to patient having technical difficulties OR patient did not have access to video capability.  We continued and completed visit with audio only.  Some vital signs may be absent or patient reported.   Time Spent with patient on telephone encounter: 35 minutes   Review of Systems     Cardiac Risk Factors include: advanced age (>14mn, >>18women);diabetes mellitus;hypertension;obesity (BMI >30kg/m2);sedentary lifestyle     Objective:    Today's Vitals   06/17/21 1400  Weight: 219 lb (99.3 kg)  Height: '4\' 10"'$  (1.473 m)   Body mass index is 45.77 kg/m.     06/17/2021    2:06 PM 08/15/2020    4:28 AM 10/25/2019    8:47 AM 09/08/2019    8:17 AM 07/19/2019    8:15 AM 10/24/2017    9:32 PM 10/24/2017    1:09 PM  Advanced Directives  Does Patient Have a Medical Advance Directive? No No No No No  No  Would patient like information on creating a medical advance directive?  No - Patient declined Yes (MAU/Ambulatory/Procedural Areas - Information given) Yes (MAU/Ambulatory/Procedural Areas - Information given)  No - Patient declined     Current Medications  (verified) Outpatient Encounter Medications as of 06/17/2021  Medication Sig   albuterol (VENTOLIN HFA) 108 (90 Base) MCG/ACT inhaler INHALE 2 PUFFS BY MOUTH EVERY 6 HOURS AS NEEDED   hydrochlorothiazide (HYDRODIURIL) 25 MG tablet Take 1 tablet by mouth once daily   losartan (COZAAR) 25 MG tablet Take 1 tablet by mouth once daily   ALPRAZolam (XANAX) 0.5 MG tablet Take 1 tablet (0.5 mg total) by mouth 2 (two) times daily as needed for anxiety. (Patient not taking: Reported on 04/04/2021)   gabapentin (NEURONTIN) 100 MG capsule Take 1 capsule (100 mg total) by mouth 3 (three) times daily. (Patient not taking: Reported on 06/17/2021)   ibuprofen (ADVIL) 600 MG tablet TAKE 1 TABLET BY MOUTH EVERY 8 HOURS AS NEEDED (Patient not taking: Reported on 06/17/2021)   losartan (COZAAR) 25 MG tablet Take 1 tablet (25 mg total) by mouth daily. (Patient not taking: Reported on 06/17/2021)   meclizine (ANTIVERT) 12.5 MG tablet Take 1 tablet (12.5 mg total) by mouth 3 (three) times daily as needed for dizziness. (Patient not taking: Reported on 04/04/2021)   No facility-administered encounter medications on file as of 06/17/2021.    Allergies (verified) Penicillins   History: Past Medical History:  Diagnosis Date   Allergy    Anemia    Anxiety    Arthritis    Thumbs   Blood transfusion without reported diagnosis    Depression    Diabetes mellitus without complication ([facility]    type 2   GERD (gastroesophageal reflux disease)    Hyperlipidemia  Hypertension    Morbid obesity with BMI of 50.0-59.9, adult ([place])    Osteoporosis    Pneumonia    Substance abuse ([place])    ETOH abuse, quit drinking in 1985   Past Surgical History:  Procedure Laterality Date   ABDOMINAL HYSTERECTOMY     CERVIX REMOVAL     CESAREAN SECTION     COLONOSCOPY     OOPHORECTOMY     UPPER GASTROINTESTINAL ENDOSCOPY     WISDOM TOOTH EXTRACTION     Family History  Problem Relation Age of Onset   Heart disease Father        After  the age of 48   Uterine cancer Mother    Colon cancer Neg Hx    Esophageal cancer Neg Hx    Rectal cancer Neg Hx    Stomach cancer Neg Hx    Social History   Socioeconomic History   Marital status: Married    Spouse name: Not on file   Number of children: Not on file   Years of education: Not on file   Highest education level: Not on file  Occupational History   Not on file  Tobacco Use   Smoking status: Never    Passive exposure: Yes   Smokeless tobacco: Never  Vaping Use   Vaping Use: Never used  Substance and Sexual Activity   Alcohol use: Not Currently    Comment: 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   Drug use: Not Currently   Sexual activity: Not on file    Comment: Hysterectomy  Other Topics Concern   Not on file  Social History Narrative   Not on file   Social Determinants of Health   Financial Resource Strain: Low Risk    Difficulty of Paying Living Expenses: Not hard at all  Food Insecurity: No Food Insecurity   Worried About Running Out of Food in the Last Year: Never true   [place] in the Last Year: Never true  Transportation Needs: No Transportation Needs   Lack of Transportation (Medical): No   Lack of Transportation (Non-Medical): No  Physical Activity: Inactive   Days of Exercise per Week: 0 days   Minutes of Exercise per Session: 0 min  Stress: No Stress Concern Present   Feeling of Stress : Not at all  Social Connections: Not on file    Tobacco Counseling Counseling given: Not Answered   Clinical Intake:  Pre-visit preparation completed: Yes  Pain : No/denies pain     BMI - recorded: 45.77 Nutritional Status: BMI > 30  Obese Nutritional Risks: None Diabetes: Yes CBG done?: No Did pt. bring in CBG monitor from home?: No  How often do you need to have someone help you when you read instructions, pamphlets, or other written materials from  your doctor or pharmacy?: 1 - Never  Diabetes:  Is the patient diabetic?  Yes  If diabetic, was a CBG obtained today?  No  Did the patient bring in their glucometer from home?  No phone visit How often do you monitor your CBG's? never.   Financial Strains and Diabetes Management:  Are you having any financial strains with the device, your supplies or your medication? No .  Does the patient want to be seen by Chronic Care Management for management of their diabetes?  No  Would the patient like to be referred to a  Nutritionist or for Diabetic Management?  No   Diabetic Exams:  Diabetic Eye Exam: . Overdue for diabetic eye exam. Pt has been advised about the importance in completing this exam.   Diabetic Foot Exam: Pt has been advised about the importance in completing this exam. To be completed by PCP.    Interpreter Needed?: No  Information entered by :: Caroleen Hamman LPN   Activities of Daily Living    06/17/2021    2:11 PM  In your present state of health, do you have any difficulty performing the following activities:  Hearing? 0  Vision? 0  Difficulty concentrating or making decisions? 1  Comment occasinally  Walking or climbing stairs? 0  Dressing or bathing? 0  Doing errands, shopping? 0  Preparing Food and eating ? N  Using the Toilet? N  In the past six months, have you accidently leaked urine? N  Do you have problems with loss of bowel control? N  Managing your Medications? N  Managing your Finances? N  Housekeeping or managing your Housekeeping? N    Patient Care Team: Saguier, Iris Pert as PCP - General (Internal Medicine) Skeet Latch, MD as PCP - Cardiology (Cardiology)  Indicate any recent Medical Services you may have received from other than [facility] providers in the past year (date may be approximate).     Assessment:   This is a routine wellness examination for Amber Huffman.  Hearing/Vision screen Hearing Screening - Comments:: C/o mild hearing  loss &amp; Tinnitus Vision Screening - Comments:: Last eye exam-within the last year-Dr Stephanie Coup  Dietary issues and exercise activities discussed: Current Exercise Habits: The patient does not participate in regular exercise at present, Exercise limited by: None identified   Goals Addressed             This Visit's Progress    Patient Stated       Drink more water       Depression Screen    06/17/2021    2:11 PM 08/14/2020    3:23 PM  PHQ 2/9 Scores  PHQ - 2 Score 1 0    Fall Risk    06/17/2021    2:08 PM  [place] in the past year? 1  Number falls in past yr: 0  Injury with Fall? 0  Follow up Falls prevention discussed    FALL RISK PREVENTION PERTAINING TO THE HOME:  Any stairs in or around the home? Yes  If so, are there any without handrails? No  Home free of loose throw rugs in walkways, pet beds, electrical cords, etc? Yes pt aware of falls risk Adequate lighting in your home to reduce risk of falls? Yes   ASSISTIVE DEVICES UTILIZED TO PREVENT FALLS:  Life alert? No  Use of a cane, walker or w/c? No  Grab bars in the bathroom? No  Shower chair or bench in shower? No  Elevated toilet seat or a handicapped toilet? No   TIMED UP AND GO:  Was the test performed? No . Phone visit   Cognitive Function:Normal cognitive status assessed by this Nurse Health Advisor. No abnormalities found.          Immunizations  There is no immunization history on file for this patient.  TDAP status: Due, Education has been provided regarding the importance of this vaccine. Advised may receive this vaccine at local pharmacy or Health Dept. Aware to provide a copy of the vaccination record if obtained from local pharmacy or Health Dept.  Verbalized acceptance and understanding.  Flu Vaccine status: Due, Education has been provided regarding the importance of this vaccine. Advised may receive this vaccine at local pharmacy or Health Dept. Aware to provide a copy of  the vaccination record if obtained from local pharmacy or Health Dept. Verbalized acceptance and understanding.  Pneumococcal vaccine status: Due, Education has been provided regarding the importance of this vaccine. Advised may receive this vaccine at local pharmacy or Health Dept. Aware to provide a copy of the vaccination record if obtained from local pharmacy or Health Dept. Verbalized acceptance and understanding.  Covid-19 vaccine status: Information provided on how to obtain vaccines.   Qualifies for Shingles Vaccine? Yes   Zostavax completed No   Shingrix Completed?: No.    Education has been provided regarding the importance of this vaccine. Patient has been advised to call insurance company to determine out of pocket expense if they have not yet received this vaccine. Advised may also receive vaccine at local pharmacy or Health Dept. Verbalized acceptance and understanding.  Screening Tests Health Maintenance  Topic Date Due   COVID-19 Vaccine (1) Never done   Hepatitis C Screening  Never done   TETANUS/TDAP  Never done   Zoster Vaccines- Shingrix (1 of 2) Never done   Pneumonia Vaccine 61+ Years old (1 - PCV) Never done   DEXA SCAN  Never done   INFLUENZA VACCINE  08/13/2021   MAMMOGRAM  12/04/2022   COLONOSCOPY (Pts 45-67yr Insurance coverage will need to be confirmed)  11/02/2029   HPV VACCINES  Aged Out    Health Maintenance  Health Maintenance Due  Topic Date Due   COVID-19 Vaccine (1) Never done   Hepatitis C Screening  Never done   TETANUS/TDAP  Never done   Zoster Vaccines- Shingrix (1 of 2) Never done   Pneumonia Vaccine 73 Years old (1 - PCV) Never done   DEXA SCAN  Never done    Colorectal cancer screening: Type of screening: Colonoscopy. Completed 11/03/2019. Repeat every 10 years  Mammogram status: Completed bilateral 12/03/2020. Repeat every year  Bone Density status: Ordered today. Pt provided with contact info and advised to call to schedule  appt.  Lung Cancer Screening: (Low Dose CT Chest recommended if Age 73-80years, 30 pack-year currently smoking OR have quit w/in 15years.) does not qualify.     Additional Screening:  Hepatitis C Screening: does qualify; PCP to order  Vision Screening: Recommended annual ophthalmology exams for early detection of glaucoma and other disorders of the eye. Is the patient up to date with their annual eye exam?  Yes  Who is the provider or what is the name of the office in which the patient attends annual eye exams? Dr. BStephanie Coup  Dental Screening: Recommended annual dental exams for proper oral hygiene  Community Resource Referral / Chronic Care Management: CRR required this visit?  No   CCM required this visit?  No      Plan:     I have personally reviewed and noted the following in the patient's chart:   Medical and social history Use of alcohol, tobacco or illicit drugs  Current medications and supplements including opioid prescriptions. Patient is not currently taking opioid prescriptions. Functional ability and status Nutritional status Physical activity Advanced directives List of other physicians Hospitalizations, surgeries, and ER visits in previous 12 months Vitals Screenings to include cognitive, depression, and falls Referrals and appointments  In addition, I have reviewed and discussed with patient certain preventive protocols,  quality metrics, and best practice recommendations. A written personalized care plan for preventive services as well as general preventive health recommendations were provided to patient.   Due to this being a telephonic visit, the after visit summary with patients personalized plan was offered to patient via mail or my-chart. Patient to pick up at office at next visit.   Marta Antu, LPN   02/14/1171  Nurse health Advisor  Nurse Notes: None  Review and Agree with assessment & plan of LPN   Mackie Pai, PA-C

## 2021-06-17 NOTE — Patient Instructions (Signed)
[name] , Thank you for taking time to complete your Medicare Wellness Visit. I appreciate your ongoing commitment to your health goals. Please review the following plan we discussed and let me know if I can assist you in the future.   Screening recommendations/referrals: Colonoscopy: Completed 11/03/2019-Due 11/02/2029 Mammogram: Completed 12/03/2020-Due 12/03/2021 Bone Density: Ordered today. Someone will call you to schedule. Recommended yearly ophthalmology/optometry visit for glaucoma screening and checkup Recommended yearly dental visit for hygiene and checkup  Vaccinations: declines all Influenza vaccine: recommend every Fall Pneumococcal vaccine: recommend once per lifetime Prevnar-20 Tdap vaccine: recommend every 10 years Shingles vaccine: recommend Shingrix which is 2 doses 2-6 months apart and over 90% effective    Covid-19: recommend 2 doses one month apart with a booster 6 months later   Advanced directives: May pick up information at our office  Conditions/risks identified: See problem list  Next appointment: Follow up in one year for your annual wellness visit    Preventive Care 65 Years and Older, Female Preventive care refers to lifestyle choices and visits with your health care provider that can promote health and wellness. What does preventive care include? A yearly physical exam. This is also called an annual well check. Dental exams once or twice a year. Routine eye exams. Ask your health care provider how often you should have your eyes checked. Personal lifestyle choices, including: Daily care of your teeth and gums. Regular physical activity. Eating a healthy diet. Avoiding tobacco and drug use. Limiting alcohol use. Practicing safe sex. Taking low-dose aspirin every day. Taking vitamin and mineral supplements as recommended by your health care provider. What happens during an annual well check? The services and screenings done by your health care  provider during your annual well check will depend on your age, overall health, lifestyle risk factors, and family history of disease. Counseling  Your health care provider may ask you questions about your: Alcohol use. Tobacco use. Drug use. Emotional well-being. Home and relationship well-being. Sexual activity. Eating habits. History of falls. Memory and ability to understand (cognition). Work and work Statistician. Reproductive health. Screening  You may have the following tests or measurements: Height, weight, and BMI. Blood pressure. Lipid and cholesterol levels. These may be checked every 5 years, or more frequently if you are over 48 years old. Skin check. Lung cancer screening. You may have this screening every year starting at age 19 if you have a 30-pack-year history of smoking and currently smoke or have quit within the past 15 years. Fecal occult blood test (FOBT) of the stool. You may have this test every year starting at age 82. Flexible sigmoidoscopy or colonoscopy. You may have a sigmoidoscopy every 5 years or a colonoscopy every 10 years starting at age 29. Hepatitis C blood test. Hepatitis B blood test. Sexually transmitted disease (STD) testing. Diabetes screening. This is done by checking your blood sugar (glucose) after you have not eaten for a while (fasting). You may have this done every 1-3 years. Bone density scan. This is done to screen for osteoporosis. You may have this done starting at age 17. Mammogram. This may be done every 1-2 years. Talk to your health care provider about how often you should have regular mammograms. Talk with your health care provider about your test results, treatment options, and if necessary, the need for more tests. Vaccines  Your health care provider may recommend certain vaccines, such as: Influenza vaccine. This is recommended every year. Tetanus, diphtheria, and acellular pertussis (Tdap,  Td) vaccine. You may need a Td  booster every 10 years. Zoster vaccine. You may need this after age 15. Pneumococcal 13-valent conjugate (PCV13) vaccine. One dose is recommended after age 64. Pneumococcal polysaccharide (PPSV23) vaccine. One dose is recommended after age 73. Talk to your health care provider about which screenings and vaccines you need and how often you need them. This information is not intended to replace advice given to you by your health care provider. Make sure you discuss any questions you have with your health care provider. Document Released: 01/26/2015 Document Revised: 09/19/2015 Document Reviewed: 10/31/2014 Elsevier Interactive Patient Education  2017 [place] Prevention in the Home Falls can cause injuries. They can happen to people of all ages. There are many things you can do to make your home safe and to help prevent falls. What can I do on the outside of my home? Regularly fix the edges of walkways and driveways and fix any cracks. Remove anything that might make you trip as you walk through a door, such as a raised step or threshold. Trim any bushes or trees on the path to your home. Use bright outdoor lighting. Clear any walking paths of anything that might make someone trip, such as rocks or tools. Regularly check to see if handrails are loose or broken. Make sure that both sides of any steps have handrails. Any raised decks and porches should have guardrails on the edges. Have any leaves, snow, or ice cleared regularly. Use sand or salt on walking paths during winter. Clean up any spills in your garage right away. This includes oil or grease spills. What can I do in the bathroom? Use night lights. Install grab bars by the toilet and in the tub and shower. Do not use towel bars as grab bars. Use non-skid mats or decals in the tub or shower. If you need to sit down in the shower, use a plastic, non-slip stool. Keep the floor dry. Clean up any water that spills on the floor  as soon as it happens. Remove soap buildup in the tub or shower regularly. Attach bath mats securely with double-sided non-slip rug tape. Do not have throw rugs and other things on the floor that can make you trip. What can I do in the bedroom? Use night lights. Make sure that you have a light by your bed that is easy to reach. Do not use any sheets or blankets that are too big for your bed. They should not hang down onto the floor. Have a firm chair that has side arms. You can use this for support while you get dressed. Do not have throw rugs and other things on the floor that can make you trip. What can I do in the kitchen? Clean up any spills right away. Avoid walking on wet floors. Keep items that you use a lot in easy-to-reach places. If you need to reach something above you, use a strong step stool that has a grab bar. Keep electrical cords out of the way. Do not use floor polish or wax that makes floors slippery. If you must use wax, use non-skid floor wax. Do not have throw rugs and other things on the floor that can make you trip. What can I do with my stairs? Do not leave any items on the stairs. Make sure that there are handrails on both sides of the stairs and use them. Fix handrails that are broken or loose. Make sure that handrails are as  long as the stairways. Check any carpeting to make sure that it is firmly attached to the stairs. Fix any carpet that is loose or worn. Avoid having throw rugs at the top or bottom of the stairs. If you do have throw rugs, attach them to the floor with carpet tape. Make sure that you have a light switch at the top of the stairs and the bottom of the stairs. If you do not have them, ask someone to add them for you. What else can I do to help prevent falls? Wear shoes that: Do not have high heels. Have rubber bottoms. Are comfortable and fit you well. Are closed at the toe. Do not wear sandals. If you use a stepladder: Make sure that it is  fully opened. Do not climb a closed stepladder. Make sure that both sides of the stepladder are locked into place. Ask someone to hold it for you, if possible. Clearly mark and make sure that you can see: Any grab bars or handrails. First and last steps. Where the edge of each step is. Use tools that help you move around (mobility aids) if they are needed. These include: Canes. Walkers. Scooters. Crutches. Turn on the lights when you go into a dark area. Replace any light bulbs as soon as they burn out. Set up your furniture so you have a clear path. Avoid moving your furniture around. If any of your floors are uneven, fix them. If there are any pets around you, be aware of where they are. Review your medicines with your doctor. Some medicines can make you feel dizzy. This can increase your chance of falling. Ask your doctor what other things that you can do to help prevent falls. This information is not intended to replace advice given to you by your health care provider. Make sure you discuss any questions you have with your health care provider. Document Released: 10/26/2008 Document Revised: 06/07/2015 Document Reviewed: 02/03/2014 Elsevier Interactive Patient Education  2017 [employer].

## 2021-06-18 ENCOUNTER — Other Ambulatory Visit (HOSPITAL_BASED_OUTPATIENT_CLINIC_OR_DEPARTMENT_OTHER): Payer: Self-pay | Admitting: Medical

## 2021-06-18 DIAGNOSIS — Z1231 Encounter for screening mammogram for malignant neoplasm of breast: Secondary | ICD-10-CM

## 2021-07-23 DIAGNOSIS — M1711 Unilateral primary osteoarthritis, right knee: Secondary | ICD-10-CM | POA: Diagnosis not present

## 2021-07-31 ENCOUNTER — Encounter (HOSPITAL_BASED_OUTPATIENT_CLINIC_OR_DEPARTMENT_OTHER): Payer: Self-pay | Admitting: Cardiovascular Disease

## 2021-07-31 ENCOUNTER — Encounter: Payer: Self-pay | Admitting: *Deleted

## 2021-07-31 ENCOUNTER — Ambulatory Visit (HOSPITAL_BASED_OUTPATIENT_CLINIC_OR_DEPARTMENT_OTHER): Payer: Medicare PPO | Admitting: Cardiovascular Disease

## 2021-07-31 DIAGNOSIS — R002 Palpitations: Secondary | ICD-10-CM | POA: Insufficient documentation

## 2021-07-31 DIAGNOSIS — I10 Essential (primary) hypertension: Secondary | ICD-10-CM | POA: Diagnosis not present

## 2021-07-31 DIAGNOSIS — E7800 Pure hypercholesterolemia, unspecified: Secondary | ICD-10-CM | POA: Diagnosis not present

## 2021-07-31 HISTORY — DX: Palpitations: R00.2

## 2021-07-31 NOTE — Assessment & Plan Note (Signed)
She is due for fasting lipids.  She will have to come back for this.

## 2021-07-31 NOTE — Progress Notes (Signed)
Patient ID: Amber Huffman, female   DOB: Apr 14, 1950, 71 y.o.   MRN: [id number] Patient enrolled for [employer] to ship a 30 day cardiac event monitor to her address on file.

## 2021-07-31 NOTE — Assessment & Plan Note (Signed)
Referral to prep as above.  We will also refer her to this for a GLP-1 agonist.

## 2021-07-31 NOTE — Patient Instructions (Addendum)
Medication Instructions:  Your physician recommends that you continue on your current medications as directed. Please refer to the Current Medication list given to you today.   *If you need a refill on your cardiac medications before your next appointment, please call your pharmacy*  Lab Work: LP/CMET/MAGNESIUM/TSH/FT4/CBC TODAY   Testing/Procedures: Your physician has recommended that you wear an event monitor. Event monitors are medical devices that record the heart's electrical activity. Doctors most often [place] these monitors to diagnose arrhythmias. Arrhythmias are problems with the speed or rhythm of the heartbeat. The monitor is a small, portable device. You can wear one while you do your normal daily activities. This is usually used to diagnose what is causing palpitations/syncope (passing out). THIS WILL BE MAILED TO YOU   Follow-Up: At [facility], you and your health needs are our priority.  As part of our continuing mission to provide you with exceptional heart care, we have created designated Provider Care Teams.  These Care Teams include your primary Cardiologist (physician) and Advanced Practice Providers (APPs -  Physician Assistants and Nurse Practitioners) who all work together to provide you with the care you need, when you need it.  We recommend signing up for the patient portal called "MyChart".  Sign up information is provided on this After Visit Summary.  MyChart is used to connect with patients for Virtual Visits (Telemedicine).  Patients are able to view lab/test results, encounter notes, upcoming appointments, etc.  Non-urgent messages can be sent to your provider as well.   To learn more about what you can do with MyChart, go to [email or web address].    Your next appointment:   3 month(s)  The format for your next appointment:   In Person  Provider:   Skeet Latch, MD   You have been referred to [facility]   Other  Instructions  [employer] Cardiac Event Monitor Instructions Your physician has requested you wear your cardiac event monitor for _30__ days, (1-30). [employer] may call or text to confirm a shipping address. The monitor will be sent to a land address via UPS. [employer] will not ship a monitor to a PO BOX. It typically takes 3-5 days to receive your monitor after it has been enrolled. [employer] will assist with USPS tracking if your package is delayed. The telephone number for [employer] is [phone]. Once you have received your monitor, please review the enclosed instructions. Instruction tutorials can also be viewed under help and settings on the enclosed cell phone. Your monitor has already been registered assigning a specific monitor serial # to you.  Applying the monitor Remove cell phone from case and turn it on. The cell phone works as Dealer and needs to be within [employer] of you at all times. The cell phone will need to be charged on a daily basis. We recommend you plug the cell phone into the enclosed charger at your bedside table every night.  Monitor batteries: You will receive two monitor batteries labelled #1 and #2. These are your recorders. Plug battery #2 onto the second connection on the enclosed charger. Keep one battery on the charger at all times. This will keep the monitor battery deactivated. It will also keep it fully charged for when you need to switch your monitor batteries. A small light will be blinking on the battery emblem when it is charging. The light on the battery emblem will remain on when the battery is fully charged.  Open package of a Monitor strip.  Insert battery #1 into black hood on strip and gently squeeze monitor battery onto connection as indicated in instruction booklet. Set aside while preparing skin.  Choose location for your strip, vertical or horizontal, as indicated in the instruction booklet. Shave to remove all hair from  location. There cannot be any lotions, oils, powders, or colognes on skin where monitor is to be applied. Wipe skin clean with enclosed Saline wipe. Dry skin completely.  Peel paper labeled #1 off the back of the Monitor strip exposing the adhesive. Place the monitor on the chest in the vertical or horizontal position shown in the instruction booklet. One arrow on the monitor strip must be pointing upward. Carefully remove paper labeled #2, attaching remainder of strip to your skin. Try not to create any folds or wrinkles in the strip as you apply it.  Firmly press and release the circle in the center of the monitor battery. You will hear a small beep. This is turning the monitor battery on. The heart emblem on the monitor battery will light up every 5 seconds if the monitor battery in turned on and connected to the patient securely. Do not push and hold the circle down as this turns the monitor battery off. The cell phone will locate the monitor battery. A screen will appear on the cell phone checking the connection of your monitor strip. This may read poor connection initially but change to good connection within the next minute. Once your monitor accepts the connection you will hear a series of 3 beeps followed by a climbing crescendo of beeps. A screen will appear on the cell phone showing the two monitor strip placement options. Touch the picture that demonstrates where you applied the monitor strip.  Your monitor strip and battery are waterproof. You are able to shower, bathe, or swim with the monitor on. They just ask you do not submerge deeper than 3 feet underwater. We recommend removing the monitor if you are swimming in a lake, river, or ocean.  Your monitor battery will need to be switched to a fully charged monitor battery approximately once a week. The cell phone will alert you of an action which needs to be made.  On the cell phone, tap for details to reveal connection status,  monitor battery status, and cell phone battery status. The green dots indicates your monitor is in good status. A red dot indicates there is something that needs your attention.  To record a symptom, click the circle on the monitor battery. In 30-60 seconds a list of symptoms will appear on the cell phone. Select your symptom and tap save. Your monitor will record a sustained or significant arrhythmia regardless of you clicking the button. Some patients do not feel the heart rhythm irregularities. [employer] will notify us of any serious or critical events.  Refer to instruction booklet for instructions on switching batteries, changing strips, the Do not disturb or Pause features, or any additional questions.  Call [employer] at [phone], to confirm your monitor is transmitting and record your baseline. They will answer any questions you may have regarding the monitor instructions at that time.  Returning the monitor to [place] all equipment back into blue box. Peel off strip of paper to expose adhesive and close box securely. There is a prepaid UPS shipping label on this box. Drop in a UPS drop box, or at a UPS facility like [employer]. You may also contact [employer] to arrange UPS to pick up monitor package at your  home.

## 2021-07-31 NOTE — Assessment & Plan Note (Addendum)
She does have a couple episodes of palpitations over the last few weeks.  We will have her get a 30-day ZIO monitor.  We will also check CMP, CBC, magnesium, and TSH.  We discussed trying to limit her caffeine intake as well.

## 2021-07-31 NOTE — Progress Notes (Signed)
Cardiology Office Note   Date:  07/31/2021   ID:  Amber Huffman, DOB 1950/08/12, MRN [id number]  PCP:  Mackie Pai, PA-C  Cardiologist:  Skeet Latch, MD  Electrophysiologist:  None   Evaluation Performed:  Follow-Up Visit  Chief Complaint:  hyperlipidemia  History of Present Illness:    Amber Huffman is a 71 y.o. female with morbid obesity, GERD hypertension and hyperlipidemia here for follow up.  She was initially seen in the hospital 10/2017 where she presented with chest pain.  She was diagnosed with hypertension and hyperlipidemia that admission.  She was started on hydrochlorothiazide.  Cardiac enzymes were negative and EKG was unremarkable.  Her chest pain was felt to be atypical and she was set up for outpatient stress testing.  She followed up with Amber Domino, DNP,  two weeks later.  She had not filled her medication as she is "not a medicine person."  She also rescheduled her testing.  She had an echo later that month that revealed LVEF 60 to 65% with grade 1 diastolic dysfunction.  She had a [employer] 11/2017 that revealed normal systolic function and no ischemia.  Since that time she was started on atorvastatin but did not tolerate it due to myalgias.  She followed up with our pharmacist and they recommended starting rosuvastatin 5 mg twice a week but she declined.  She wanted to work on diet and exercise.  At her last appointment Amber Huffman was doing well physically but was not exercising much due to COVID-19.  She was referred to the healthy weight and wellness clinic but hasn't been going.   Her PCP prescribed Zetia but she didn't get it. She is very afraid of having an adverse reaction after she tried a statin in the past.  Amber Huffman was referred for coronary CT-A 12/2019 that revealed no coronary artery disease and a calcium score of 0.  At her last appointment she struggled with grief after her sister's passing.  Her blood pressure was uncontrolled.   Amlodipine was discontinued and she was started on losartan.  She followed up with her PCP the following month her blood pressure was 133/70.  Over the last two weeks she had a couple episodes of fast heart rates.  It lasted 1-2 minutes.  There were no associated symptoms.  She had no lightheadedness or dizziness.  She notes that she drinks a lot coffee each day.  She caught a glimpse of her neck in her rear-view mirror and noted that one of the veins was enlarged.  She has no LE edema, orthopnea or PND.  She denies chest pain.    Past Medical History:  Diagnosis Date   Allergy    Anemia    Anxiety    Arthritis    Thumbs   Blood transfusion without reported diagnosis    Depression    Diabetes mellitus without complication ([place])    type 2   GERD (gastroesophageal reflux disease)    Hyperlipidemia    Hypertension    Morbid obesity with BMI of 50.0-59.9, adult ([place])    Osteoporosis    Palpitations 07/31/2021   Pneumonia    Substance abuse ([place])    ETOH abuse, quit drinking in 1985   Past Surgical History:  Procedure Laterality Date   ABDOMINAL HYSTERECTOMY     CERVIX REMOVAL     CESAREAN SECTION     COLONOSCOPY     OOPHORECTOMY     UPPER GASTROINTESTINAL ENDOSCOPY  WISDOM TOOTH EXTRACTION       Current Meds  Medication Sig   albuterol (VENTOLIN HFA) 108 (90 Base) MCG/ACT inhaler INHALE 2 PUFFS BY MOUTH EVERY 6 HOURS AS NEEDED   hydrochlorothiazide (HYDRODIURIL) 25 MG tablet Take 1 tablet by mouth once daily   ibuprofen (ADVIL) 600 MG tablet TAKE 1 TABLET BY MOUTH EVERY 8 HOURS AS NEEDED   losartan (COZAAR) 25 MG tablet Take 1 tablet (25 mg total) by mouth daily.     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   Drug use: Not Currently     Family Hx: The patient's family history includes Heart disease in her father; Uterine cancer in her mother. There is no history of Colon cancer, Esophageal cancer, Rectal cancer, or Stomach cancer.  ROS:   Please see the history of present illness.    All other systems reviewed and are negative.   Prior CV studies:   The following studies were reviewed today:  Echo 11/12/17: Study Conclusions   - Left ventricle: The cavity size was normal. There was moderate   concentric hypertrophy. Systolic function was normal. The   estimated ejection fraction was in the range of 60% to 65%. Wall   motion was normal; there were no regional wall motion   abnormalities. There was an increased relative contribution of   atrial contraction to ventricular filling. Doppler parameters are   consistent with abnormal left ventricular relaxation (grade 1   diastolic dysfunction). - Mitral valve: Calcified annulus. There was trivial regurgitation.   Lexiscan Myoview 11/13/17: Nuclear stress EF: 81%. The left ventricular ejection fraction is hyperdynamic (>65%). There was no ST segment deviation noted during stress. The study is normal. This is a low risk study.   Normal resting and stress perfusion. No ischemia or infarction EF 81%  Coronary CT-A 12/21: IMPRESSION: 1. Coronary calcium score of 0. This was 0 percentile for age and sex matched control.   2. Normal coronary origin with right dominance.   3. No evidence of CAD.  Carotid Doppler 09/2019: IMPRESSION: 1. Bilateral carotid bifurcation plaque resulting in less than 50% diameter ICA stenosis. 2. Antegrade bilateral vertebral arterial flow. 3. Hypertension  Labs/Other Tests and Data Reviewed:    EKG:  An ECG dated 07/19/19 was personally reviewed today and demonstrated:  sinus rhythm .  Rate 84 bpm 12/14/2019: Sinus tachycardia.  Rate 104 bpm.   Right axis deviation.  Recent Labs: 08/14/2020: Pro B Natriuretic peptide (BNP) 32.0 11/13/2020: Hemoglobin 14.5; Platelets 314.0 03/28/2021: ALT 11; BUN 21; Creatinine, Ser 0.85; Potassium 3.7; Sodium 139   Recent Lipid Panel Lab Results  Component Value Date/Time   CHOL 238 (H) 02/22/2020 11:15 AM   TRIG 97 02/22/2020 11:15 AM   HDL 46 02/22/2020 11:15 AM   CHOLHDL 5.2 (H) 02/22/2020 11:15 AM   CHOLHDL 5 02/23/2019 01:15 PM   LDLCALC 175 (H) 02/22/2020 11:15 AM    Wt Readings from Last 3 Encounters:  07/31/21 218 lb (98.9 kg)  06/17/21 219 lb (99.3 kg)  04/30/21 219 lb 3.2 oz (99.4 kg)     Objective:  VS:  BP 135/64 (BP Location: Left Arm, Patient Position: Sitting, Cuff Size: Normal)   Pulse 99   Ht '4\' 10"'$  (1.473 m)   Wt 218 lb (98.9 kg)   SpO2 92%   BMI 45.56 kg/m  , BMI Body mass index is 45.56 kg/m. GENERAL:  Well appearing HEENT: Pupils equal round and reactive, fundi not visualized, oral mucosa unremarkable NECK:  No jugular venous distention, waveform within normal limits, carotid upstroke brisk and symmetric, no bruits, no thyromegaly LUNGS:  Clear to auscultation bilaterally HEART:  RRR.  PMI not displaced or sustained,S1 and S2 within normal limits, no S3, no S4, no clicks, no rubs, no murmurs ABD:  Flat, positive bowel sounds normal in frequency in pitch, no bruits, no rebound, no guarding, no midline pulsatile mass, no hepatomegaly, no splenomegaly EXT:  2 plus pulses throughout, no edema, no cyanosis no clubbing SKIN:  No rashes no nodules NEURO:  Cranial nerves II through XII grossly intact, motor grossly intact throughout PSYCH:  Cognitively intact, oriented to person place and time  ASSESSMENT & PLAN:    Essential hypertension Blood pressure slightly above her goal of 130/80.  She is always been hesitant to take medications.  She is going to continue her hydrochlorothiazide and losartan.  We will also set her up for the PREP exercise program to the  [facility].  She is going to keep trying to work on her diet and exercise.  Referral to Pharm.D. for GLP-1 agonists.  Morbid obesity ([place]) Referral to prep as above.  We will also refer her to this for a GLP-1 agonist.  Pure hypercholesterolemia She is due for fasting lipids.  She will have to come back for this.  Palpitations She does have a couple episodes of palpitations over the last few weeks.  We will have her get a 30-day ZIO monitor.  We will also check CMP, CBC, magnesium, and TSH.  We discussed trying to limit her caffeine intake as well.   Medication Adjustments/Labs and Tests Ordered: Current medicines are reviewed at length with the patient today.  Concerns regarding medicines are outlined above.   Tests Ordered: Orders Placed This Encounter  Procedures   TSH   T4, free   Lipid panel   Comprehensive metabolic panel   Magnesium   AMB Referral to [facility] Pharm-D   CARDIAC EVENT MONITOR    Medication Changes: No orders of the defined types were placed in this encounter.   Disposition:  Follow up in 3 months.  Signed, Skeet Latch, MD  07/31/2021 12:50 PM    [facility]

## 2021-07-31 NOTE — Assessment & Plan Note (Signed)
Blood pressure slightly above her goal of 130/80.  She is always been hesitant to take medications.  She is going to continue her hydrochlorothiazide and losartan.  We will also set her up for the PREP exercise program to the [facility].  She is going to keep trying to work on her diet and exercise.  Referral to Pharm.D. for GLP-1 agonists.

## 2021-08-01 LAB — COMPREHENSIVE METABOLIC PANEL
ALT: 12 IU/L (ref 0–32)
AST: 12 IU/L (ref 0–40)
Albumin/Globulin Ratio: 1.7 (ref 1.2–2.2)
Albumin: 4.2 g/dL (ref 3.8–4.8)
Alkaline Phosphatase: 90 IU/L (ref 44–121)
BUN/Creatinine Ratio: 31 — ABNORMAL HIGH (ref 12–28)
BUN: 20 mg/dL (ref 8–27)
Bilirubin Total: 0.3 mg/dL (ref 0.0–1.2)
CO2: 30 mmol/L — ABNORMAL HIGH (ref 20–29)
Calcium: 9.7 mg/dL (ref 8.7–10.3)
Chloride: 98 mmol/L (ref 96–106)
Creatinine, Ser: 0.64 mg/dL (ref 0.57–1.00)
Globulin, Total: 2.5 g/dL (ref 1.5–4.5)
Glucose: 111 mg/dL — ABNORMAL HIGH (ref 70–99)
Potassium: 4.2 mmol/L (ref 3.5–5.2)
Sodium: 139 mmol/L (ref 134–144)
Total Protein: 6.7 g/dL (ref 6.0–8.5)
eGFR: 93 mL/min/{1.73_m2} (ref >59)

## 2021-08-01 LAB — LIPID PANEL
Chol/HDL Ratio: 4.2 ratio (ref 0.0–4.4)
Cholesterol, Total: 198 mg/dL (ref 100–199)
HDL: 47 mg/dL (ref >39)
LDL Chol Calc (NIH): 124 mg/dL — ABNORMAL HIGH (ref 0–99)
Triglycerides: 153 mg/dL — ABNORMAL HIGH (ref 0–149)
VLDL Cholesterol Cal: 27 mg/dL (ref 5–40)

## 2021-08-01 LAB — MAGNESIUM: Magnesium: 2.2 mg/dL (ref 1.6–2.3)

## 2021-08-01 LAB — TSH: TSH: 2.05 u[IU]/mL (ref 0.450–4.500)

## 2021-08-01 LAB — T4, FREE: Free T4: 1.22 ng/dL (ref 0.82–1.77)

## 2021-08-02 ENCOUNTER — Telehealth (HOSPITAL_BASED_OUTPATIENT_CLINIC_OR_DEPARTMENT_OTHER): Payer: Self-pay | Admitting: Cardiovascular Disease

## 2021-08-02 NOTE — Telephone Encounter (Signed)
Patient called to get lab results.  She would like for DR. Oval Linsey to mail them to her Sister's house where she is staying [place], [place] [address]

## 2021-08-02 NOTE — Telephone Encounter (Signed)
Left message for patient to call back   Routing to Alvina Filbert, LPN basket to print and mail labs to send to   [place], [place] [address]

## 2021-08-05 ENCOUNTER — Telehealth: Payer: Self-pay | Admitting: Cardiovascular Disease

## 2021-08-05 DIAGNOSIS — I10 Essential (primary) hypertension: Secondary | ICD-10-CM

## 2021-08-05 NOTE — Telephone Encounter (Signed)
RN returned call to office, RN informed patient that results are not back yet but I have made a note to have results mailed to patient.   Patient notes that her discharge AVS states for a CBC to be drawn, but it was not order placed for CBC to be drawn at her PharmD app on 7/27

## 2021-08-05 NOTE — Telephone Encounter (Signed)
Patient was returning phone call about results 

## 2021-08-06 NOTE — Telephone Encounter (Signed)
Labs reviewed as Dr. Oval Linsey is out of office today.   Thyroid, electrolytes, kidneys, liver are all normal. LDL (bad cholesterol) and triglycerides elevated.  Reduce intake of sweets, sugars, carbohydrates.   LDL improved but still above goal. When Dr. Oval Linsey is back in office she will review labs and decide if cholesterol medications are needed. We can mail or send via MyChart resources on lowering cholesterol if she is interested.  Nothing worrisome or concerning on labs.   Loel Dubonnet, NP

## 2021-08-06 NOTE — Telephone Encounter (Signed)
Results called to patient who verbalizes understanding! Patient states she "I will not be taking any of those medications" Patient would like her labs mailed to her once they are formally reviewed by Dr. Oval Linsey. See previous encounter for mailing address.    "Labs reviewed as Dr. Oval Linsey is out of office today.    Thyroid, electrolytes, kidneys, liver are all normal. LDL (bad cholesterol) and triglycerides elevated.  Reduce intake of sweets, sugars, carbohydrates.    LDL improved but still above goal. When Dr. Oval Linsey is back in office she will review labs and decide if cholesterol medications are needed. We can mail or send via MyChart resources on lowering cholesterol if she is interested.   Nothing worrisome or concerning on labs.    Loel Dubonnet, NP "

## 2021-08-08 ENCOUNTER — Ambulatory Visit: Payer: Medicare PPO | Admitting: Pharmacist

## 2021-08-08 ENCOUNTER — Encounter: Payer: Self-pay | Admitting: Pharmacist

## 2021-08-08 VITALS — Wt 218.4 lb

## 2021-08-08 DIAGNOSIS — E119 Type 2 diabetes mellitus without complications: Secondary | ICD-10-CM | POA: Diagnosis not present

## 2021-08-08 MED ORDER — OZEMPIC (0.25 OR 0.5 MG/DOSE) 2 MG/3ML ~~LOC~~ SOPN: 0.5000 mg | PEN_INJECTOR | SUBCUTANEOUS | 2 refills | Status: DC

## 2021-08-08 NOTE — Progress Notes (Signed)
Patient ID: Amber Huffman                 DOB: 08-08-1950                    MRN: [id number]     HPI: Amber Huffman is a 71 y.o. female patient referred to pharmacy clinic by Dr Oval Linsey to initiate weight loss therapy/DM with GLP1-RA. PMH is significant for obesity, T2DM, and HLD . Most recent BMI 45.66.  Patient reports she is in denial regarding DM diagnosis. 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    Previously had taken metformin but discontinued due to GI upset. Does not want to restart.   Labs: Lab Results  Component Value Date   HGBA1C 6.5 03/28/2021    Wt Readings from Last 1 Encounters:  07/31/21 218 lb (98.9 kg)    BP Readings from Last 1 Encounters:  07/31/21 135/64   Pulse Readings from Last 1 Encounters:  07/31/21 99       Component Value Date/Time   CHOL 198 07/31/2021 1134   TRIG 153 (H) 07/31/2021 1134   HDL 47 07/31/2021 1134   CHOLHDL 4.2 07/31/2021 1134   CHOLHDL 5 02/23/2019 1315   VLDL 25.2 02/23/2019 1315   LDLCALC 124 (H) 07/31/2021 1134    Past Medical History:  Diagnosis Date   Allergy    Anemia    Anxiety    Arthritis    Thumbs   Blood transfusion without reported diagnosis    Depression    Diabetes mellitus without complication ([place])    type 2   GERD (gastroesophageal reflux disease)    Hyperlipidemia    Hypertension    Morbid obesity with BMI of 50.0-59.9, adult ([place])    Osteoporosis    Palpitations 07/31/2021   Pneumonia    Substance abuse ([place])    ETOH abuse, quit drinking in 1985    Current Outpatient Medications on File Prior to Visit  Medication Sig Dispense Refill   albuterol (VENTOLIN HFA) 108 (90 Base) MCG/ACT inhaler INHALE 2 PUFFS BY MOUTH EVERY 6 HOURS AS NEEDED 9 g 0   hydrochlorothiazide (HYDRODIURIL) 25 MG tablet Take 1 tablet by mouth once daily 90 tablet 0   ibuprofen (ADVIL) 600 MG tablet TAKE 1 TABLET BY MOUTH EVERY 8 HOURS AS NEEDED 30 tablet 0   losartan (COZAAR) 25 MG tablet Take 1  tablet (25 mg total) by mouth daily. 90 tablet 1   No current facility-administered medications on file prior to visit.    Allergies  Allergen Reactions   Penicillins Rash    Has patient had a PCN reaction causing immediate rash, facial/tongue/throat swelling, SOB or lightheadedness with hypotension: Yes Has patient had a PCN reaction causing severe rash involving mucus membranes or skin necrosis: No Has patient had a PCN reaction that required hospitalization: No Has patient had a PCN reaction occurring within the last 10 years: No If all of the above answers are "NO", then may proceed with Cephalosporin use.     Assessment/Plan:  1. Weight loss/DM - Patient A1c at 6.5% which is at goal of <7.0%.  However BMI very elevated at 45 kg/m2.  She does not wish to restart metformin and would be an good candidate for Ozempic.  Explained mechanism of action of Ozempic and glucose metabolism.  Using demo pen, educated patient on storage, priming pen, site selection, dosing, and administration.  Patient voiced undertanding.  Gave sample pen for  patient to begin therapy at 0.'25mg'$  once weekly for 4 weeks and then increase to 0.'5mg'$  weeky.  Will complete PA and contact patient when approved.  Patient voiced understanding.  Advised patient on common side effects including nausea, diarrhea, dyspepsia, decreased appetite, and fatigue. Counseled patient on reducing meal size and how to titrate medication to minimize side effects. Counseled patient to call if intolerable side effects or if experiencing dehydration, abdominal pain, or dizziness. Patient will adhere to dietary modifications and will target at least 150 minutes of moderate intensity exercise weekly.   Karren Cobble, PharmD, BCACP, [place], [place], [place], [place], [address] Phone: [phone], Fax: [phone]

## 2021-08-08 NOTE — Patient Instructions (Addendum)
It was nice meeting you today  We would like to start you on a new medication called Ozempic which you will inject once per week  Your dose will be 0.25 once a week for 4 weeks and then increase to 0.'5mg'$  once a week  I will complete the prior authorization for you and contact you when it is approved  Please call or message me with any questions  Karren Cobble, PharmD, [place], [place], [place], [place], [place], [address] Phone: [phone], Fax: [phone]

## 2021-08-19 ENCOUNTER — Ambulatory Visit: Payer: Medicare PPO | Attending: Cardiovascular Disease

## 2021-08-19 DIAGNOSIS — R002 Palpitations: Secondary | ICD-10-CM | POA: Diagnosis not present

## 2021-08-27 IMAGING — US US CAROTID DUPLEX BILAT
1 series · 13 of 24 positions shown · non-contrast
Comparison: 04/07/2012

CLINICAL DATA: Dizziness, blurry vision. Hypertension,
hyperlipidemia, diabetes.

EXAM:
BILATERAL CAROTID DUPLEX ULTRASOUND
TECHNIQUE: Gray scale imaging, color Doppler and duplex ultrasound were
performed of bilateral carotid and vertebral arteries in the neck.

[Series 1: us carotid duplex bilat · 13 of 55 slices shown]
[im 1/55]
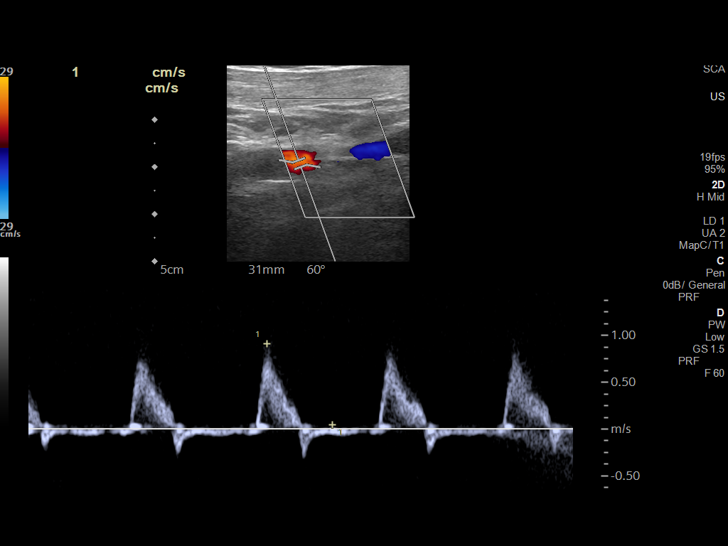
[im 5/55]
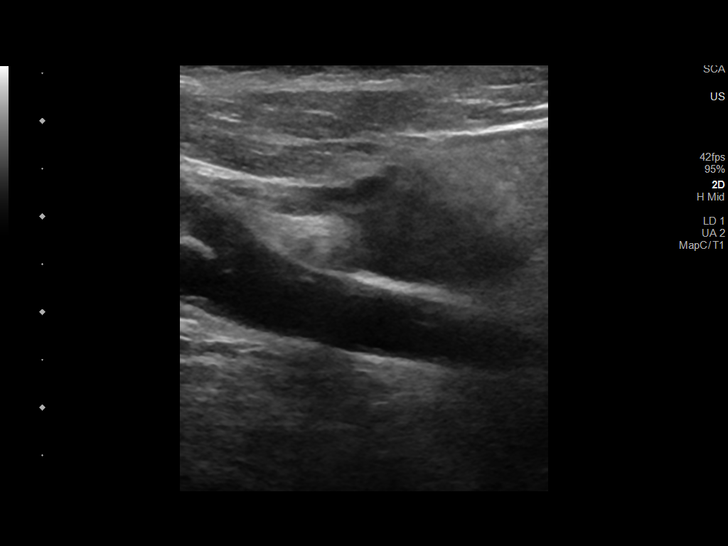
[im 10/55]
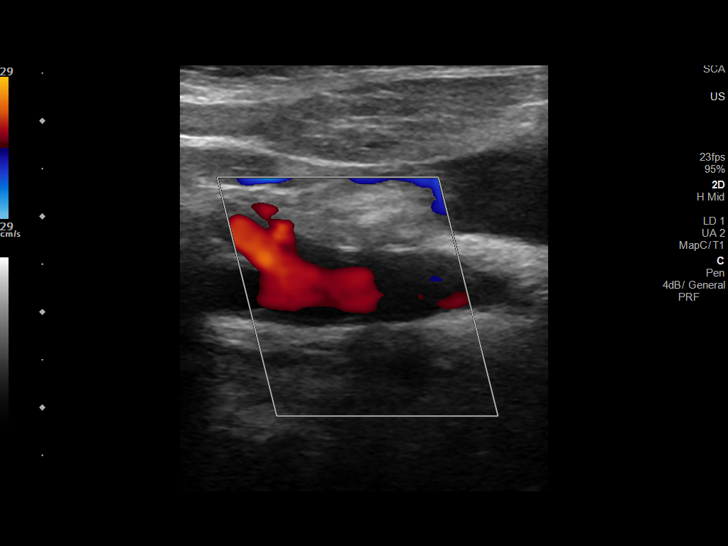
[im 15/55]
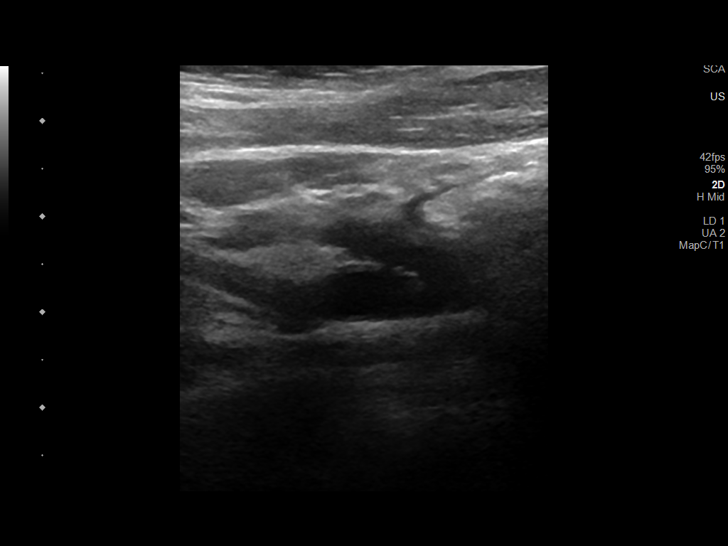
[im 19/55]
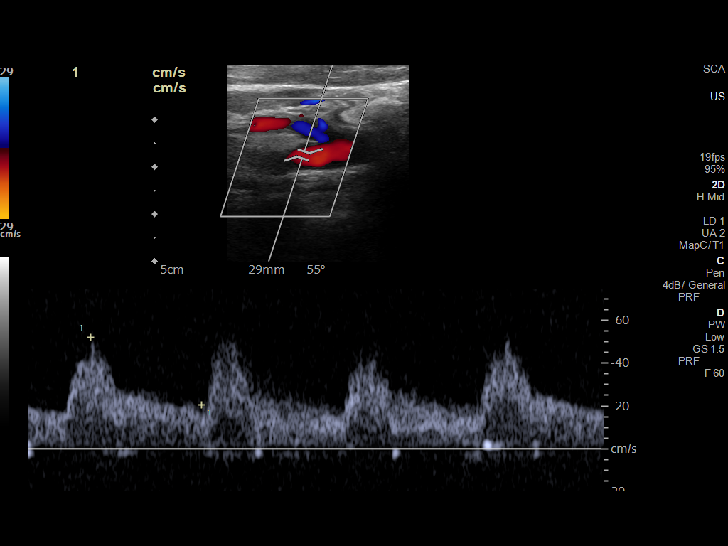
[im 24/55]
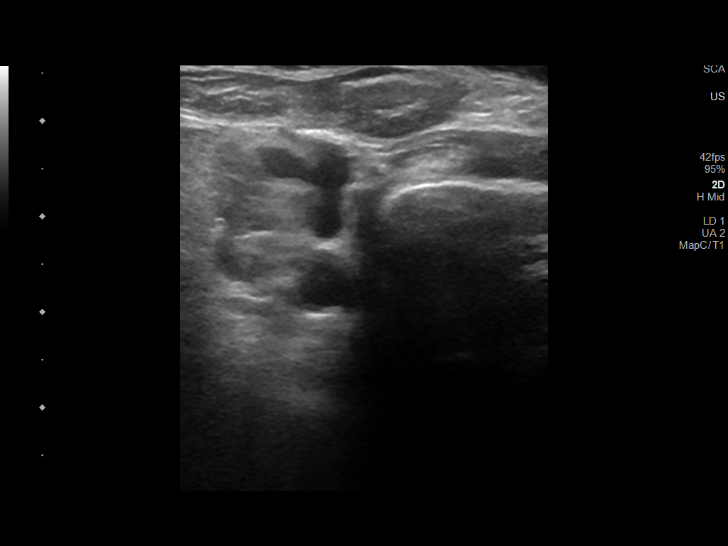
[im 29/55]
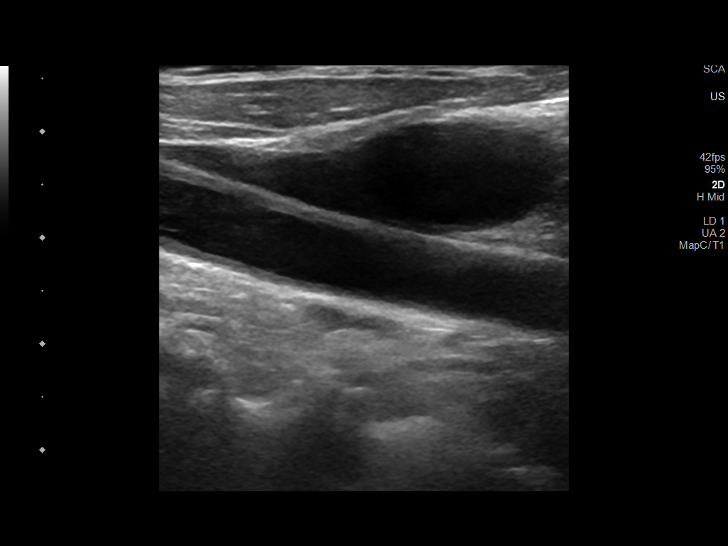
[im 31/55]
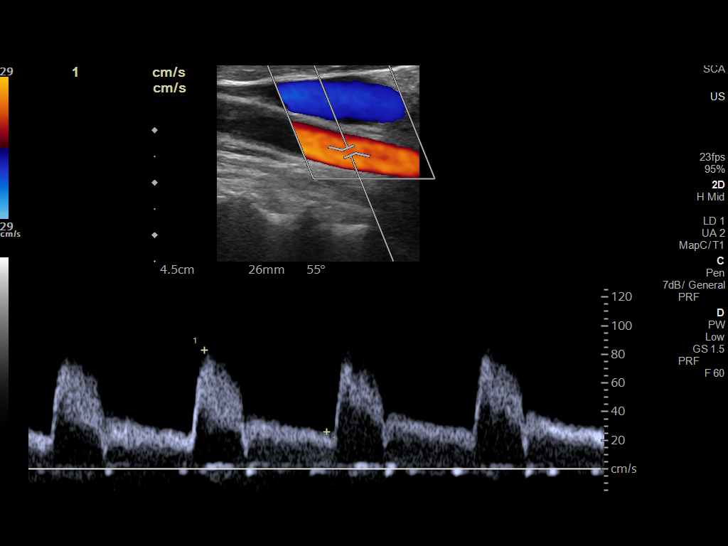
[im 36/55]
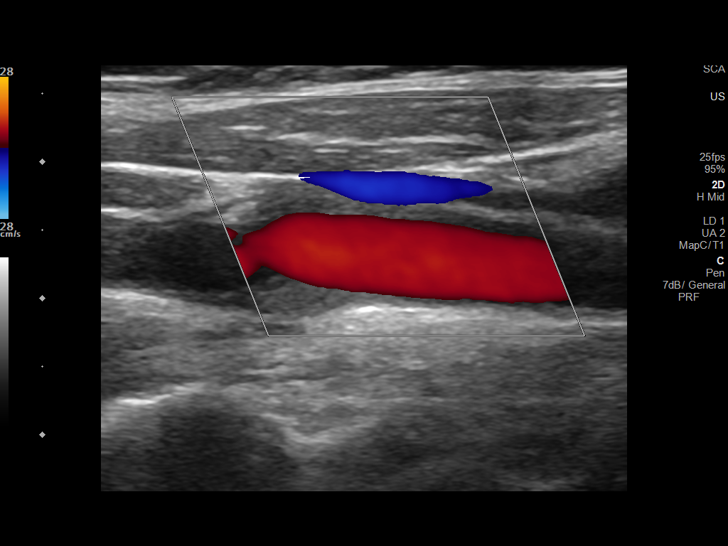
[im 40/55]
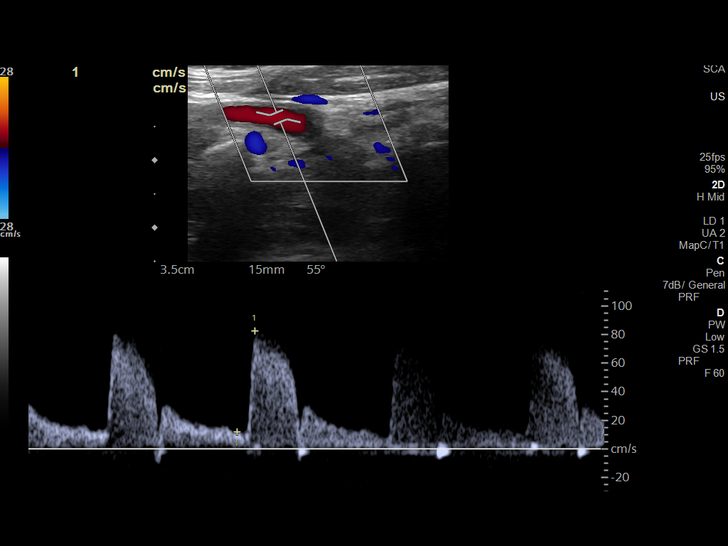
[im 45/55]
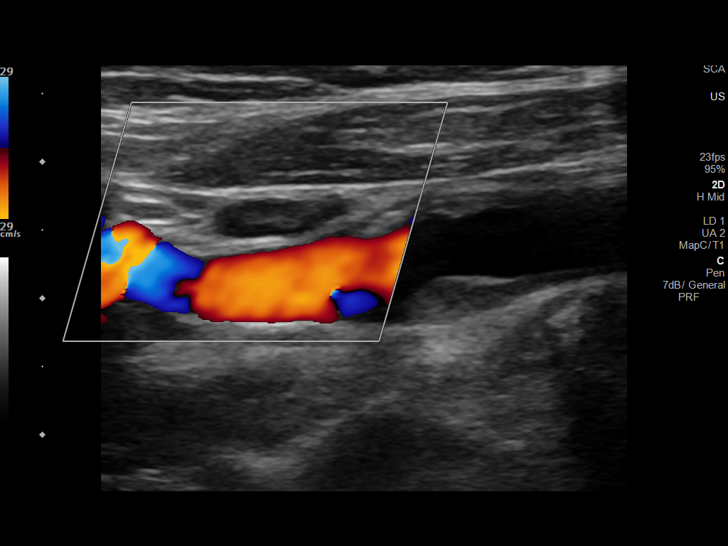
[im 50/55]
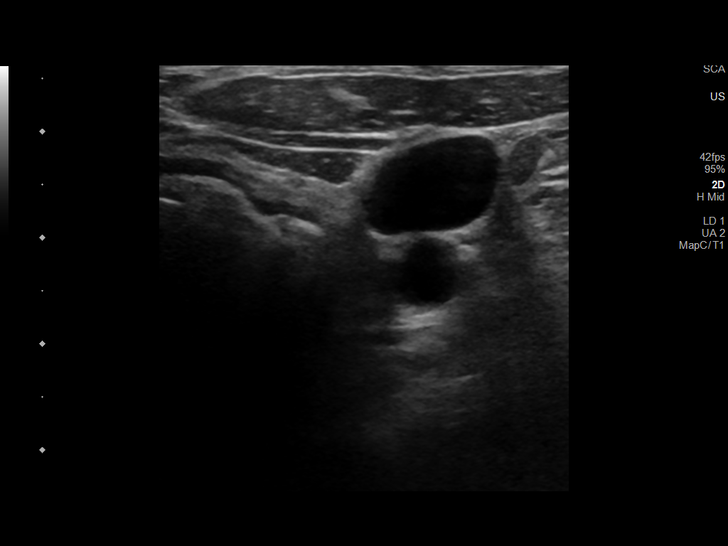
[im 55/55]
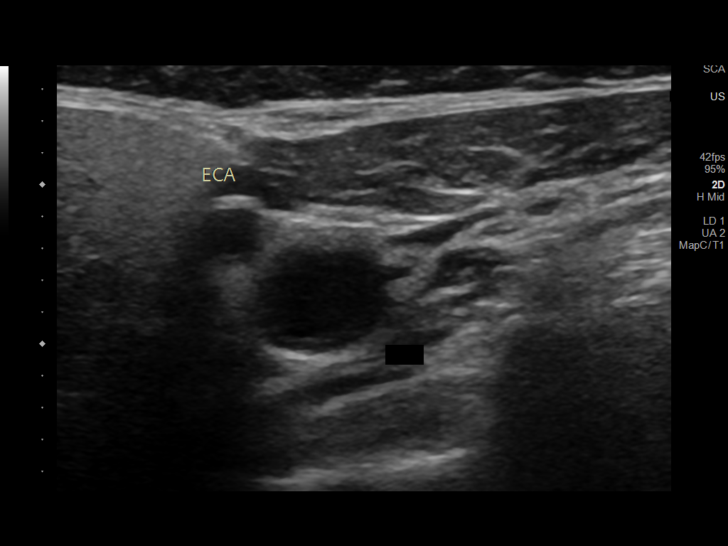

[13 of 24 positions shown; findings below may reference images not displayed]

FINDINGS: Criteria: Quantification of carotid stenosis is based on velocity
parameters that correlate the residual internal carotid diameter
with NASCET-based stenosis levels, using the diameter of the distal
internal carotid lumen as the denominator for stenosis measurement.

The following velocity measurements were obtained:

RIGHT

ICA: 74/31 cm/sec

CCA: 108/22 cm/sec

SYSTOLIC ICA/CCA RATIO:

ECA: 90 cm/sec

LEFT

ICA: 91/33 cm/sec

CCA: 83/26 cm/sec

SYSTOLIC ICA/CCA RATIO:

ECA: 82 cm/sec

RIGHT CAROTID ARTERY: Intimal thickening and noncalcified plaque in
the distal common carotid artery, bulb, and proximal ICA with only
mild stenosis. Normal waveforms and color Doppler signal.

RIGHT VERTEBRAL ARTERY: Normal flow direction and waveform. Limited
imaging of subclavian artery shows normal waveform and color Doppler
signal in the interrogated segment.

LEFT CAROTID ARTERY: Partially calcified plaque in the bulb and ICA
origin with mild stenosis. Normal waveforms and color Doppler
signal. Mild tortuosity of the ICA.

LEFT VERTEBRAL ARTERY: Normal flow direction and waveform. Limited
imaging of subclavian artery shows normal waveform and color Doppler
signal in the interrogated segment.

Upper extremity blood pressures: RIGHT: 155/90 LEFT: 169/100
IMPRESSION: 1. Bilateral carotid bifurcation plaque resulting in less than 50%
diameter ICA stenosis.
2. Antegrade bilateral vertebral arterial flow.
3. Hypertension

## 2021-08-30 ENCOUNTER — Telehealth: Payer: Self-pay | Admitting: Cardiovascular Disease

## 2021-08-30 NOTE — Telephone Encounter (Signed)
FYI--  Spoke to pt. She stated her skin is red and may be developing minor cuts around the edges of the monitor. She stated she got the sensitive strips and has switched to those. She wanted to know if we had any tips and if this will cause permanent damage to her skin. She stated she was not able to put it on for a while after it came because it was delivered to the wrong address. She activated the monitor on 08/19/21.   Pt also wanted to know if there was enough information available for Dr. Oval Linsey to review. I looked in [employer], however there is only 10 days of daily summaries.   Informed pt it will not cause permanent damage to her skin, but other than trying the sensitive strips, I did not know of anything else to try. Apologized to pt for this. Suggested to pt that she try out the sensitive strips for the next few days to see if there is some improvement. Suspect irritation is more likely from taking off and putting back on multiple times. Informed pt I will send this message and address with Dr. Oval Linsey on Monday as she will be in the office.  Pt was very thankful for the call and stated she is ok to keep going with the monitor until she hears from [place].

## 2021-08-30 NOTE — Telephone Encounter (Signed)
Patient states the skin is sensitive under the monitor and wants to know if enough has been received or if she has to continue to wear it.

## 2021-09-02 ENCOUNTER — Other Ambulatory Visit: Payer: Self-pay | Admitting: Medical

## 2021-09-02 ENCOUNTER — Telehealth: Payer: Self-pay | Admitting: Medical

## 2021-09-02 MED ORDER — HYDROCHLOROTHIAZIDE 25 MG PO TABS: 25.0000 mg | ORAL_TABLET | Freq: Every day | ORAL | 1 refills | Status: AC

## 2021-09-02 NOTE — Telephone Encounter (Signed)
Medication:hydrochlorothiazide (HYDRODIURIL) 25 MG tablet [[id number]]   Has the patient contacted their pharmacy? Yes.    Request sent; pt will be going out of town in about 4-5 hours so she wanted to call as well to see if she can pick it up before she leaves.   Preferred Pharmacy (with phone number or street name): [facility] [address] [place], [place] - [address]  [address], [place] [address]  Phone:  [phone]  Fax:  [phone]   Agent: Please be advised that RX refills may take up to 3 business days. We ask that you follow-up with your pharmacy.

## 2021-09-02 NOTE — Telephone Encounter (Signed)
Rx sent 

## 2021-09-09 NOTE — Telephone Encounter (Signed)
Left message for patient to call back      "Please check in on how sensitive skin strips are doing.  Low suspicion the previous irritation will cause any permanent issue.  When done with the monitor - can put neosporin on previous irritation to speed up healing, moisturize.    If she wishes instead of the midsternal placement she can use the left chest placement to avoid putting the sticker back on irritated area.    Recommend completing full 30 days of monitoring if she tolerates. If not, could stop after 21 days.    Loel Dubonnet, NP "

## 2021-09-09 NOTE — Telephone Encounter (Signed)
Please check in on how sensitive skin strips are doing.  Low suspicion the previous irritation will cause any permanent issue.  When done with the monitor - can put neosporin on previous irritation to speed up healing, moisturize.   If she wishes instead of the midsternal placement she can use the left chest placement to avoid putting the sticker back on irritated area.   Recommend completing full 30 days of monitoring if she tolerates. If not, could stop after 21 days.   Loel Dubonnet, NP

## 2021-09-10 NOTE — Telephone Encounter (Signed)
2nd call attempt, no answer, left message for patient to return our call.

## 2021-09-12 NOTE — Telephone Encounter (Signed)
Advised patient of lab results  She will think about starting medication and call back if she chooses that or even Pharm D appointment

## 2021-09-12 NOTE — Telephone Encounter (Signed)
Patient currently wearing monitor

## 2021-09-25 ENCOUNTER — Other Ambulatory Visit: Payer: Self-pay | Admitting: Medical

## 2021-09-30 ENCOUNTER — Telehealth: Payer: Self-pay | Admitting: Cardiovascular Disease

## 2021-09-30 NOTE — Telephone Encounter (Signed)
Ok to d/c wegovy and patient needing monitor results

## 2021-09-30 NOTE — Telephone Encounter (Signed)
Pt c/o medication issue:  1. Name of Medication:  Semaglutide,0.25 or 0.'5MG'$ /DOS, (OZEMPIC, 0.25 OR 0.5 MG/DOSE,) 2 MG/3ML SOPN   2. How are you currently taking this medication (dosage and times per day)?   3. Are you having a reaction (difficulty breathing--STAT)?   4. What is your medication issue?   Patient states she would like to stop taking this medication if at all possible. Declines having any issues with it--just wants to stop taking. Patient also requested a call back to review monitor results.

## 2021-10-14 ENCOUNTER — Telehealth (HOSPITAL_BASED_OUTPATIENT_CLINIC_OR_DEPARTMENT_OTHER): Payer: Self-pay

## 2021-10-14 NOTE — Telephone Encounter (Addendum)
Results called to patient who verbalizes understanding! Patient requests a copy of her zio report at her upcoming visit, added to appointment notes!     ----- Message from Loel Dubonnet, NP sent at 10/14/2021  9:12 AM EDT ----- Monitor with predominantly normal sinus rhythm.  Rare early beats which are not dangerous and not of concern.  No significant arrhythmia.  Good result!

## 2021-10-14 NOTE — Telephone Encounter (Signed)
Wegovy discontinued, patient monitor results just came in yesterday have not been reviewed at this time. Left message for patient explaining the above.    "OK sure.  OK to stop.   TCR "

## 2021-10-22 ENCOUNTER — Other Ambulatory Visit: Payer: Self-pay | Admitting: Medical

## 2021-10-29 ENCOUNTER — Telehealth: Payer: Self-pay | Admitting: Medical

## 2021-10-29 NOTE — Telephone Encounter (Signed)
Patient called to advise that she usually gets her losartan (COZAAR) 25 MG tablet as 90 day supply but the pharmacy notified her that it's only a 30 day supply. Patient was curious if this could be changed to 90 days. Patient already scheduled her physical for 12/10/2021 when she 'll be back in town to handle some appointments. Please give her a call to let her know if 90 day can be sent to [place] or if she needs to wait until her appt in November.

## 2021-10-30 MED ORDER — LOSARTAN POTASSIUM 25 MG PO TABS: 25.0000 mg | ORAL_TABLET | Freq: Every day | ORAL | 0 refills | Status: DC

## 2021-10-30 NOTE — Telephone Encounter (Signed)
Rx sent 

## 2021-10-31 MED ORDER — LOSARTAN POTASSIUM 25 MG PO TABS: 25.0000 mg | ORAL_TABLET | Freq: Every day | ORAL | 2 refills | Status: DC

## 2021-10-31 NOTE — Addendum Note (Signed)
Addended by: Jeronimo Greaves on: 10/31/2021 02:52 PM   Modules accepted: Orders

## 2021-10-31 NOTE — Telephone Encounter (Signed)
Pt was wanting a 90 day supply. She would like a call once this has been changed.

## 2021-10-31 NOTE — Telephone Encounter (Signed)
90 day supply updated

## 2021-11-06 ENCOUNTER — Ambulatory Visit (HOSPITAL_BASED_OUTPATIENT_CLINIC_OR_DEPARTMENT_OTHER): Payer: Medicare PPO | Admitting: Cardiovascular Disease

## 2021-11-12 IMAGING — CT CT HEART MORP W/ CTA COR W/ SCORE W/ CA W/CM &/OR W/O CM
4 of 7 series · 8 of 20 positions shown, 9 images · non-contrast
Comparison: None.
COMPARISON: None.

Addendum:
EXAM:
OVER-READ INTERPRETATION  CT CHEST

The following report is an over-read performed by radiologist Dr.
Saurabh Haugen [REDACTED] on 12/29/2019. This
over-read does not include interpretation of cardiac or coronary
anatomy or pathology. The coronary calcium score/coronary CTA
interpretation by the cardiologist is attached.
CLINICAL DATA: 69F with hypertension, hyperlipidemia, morbid
obesity and chest pain.
Cardiac/Coronary  CT
TECHNIQUE: The patient was scanned on a Phillips Force scanner.

[Series 6: best diast · axial · 0.39mm/px · z∈[-392,-353]mm · 2 of 293 slices shown, 3 images]
[im 98/293  vessel]
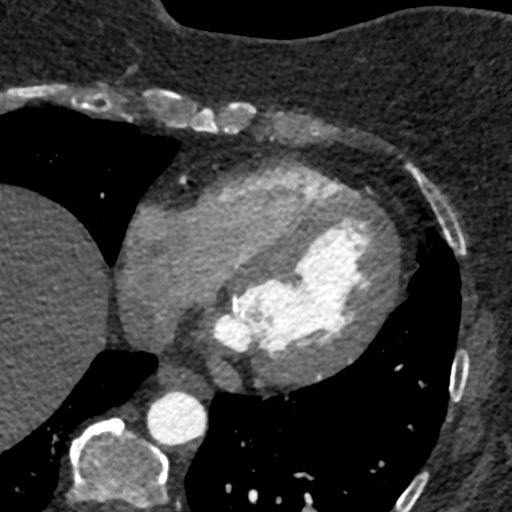
[im 98/293  lung]
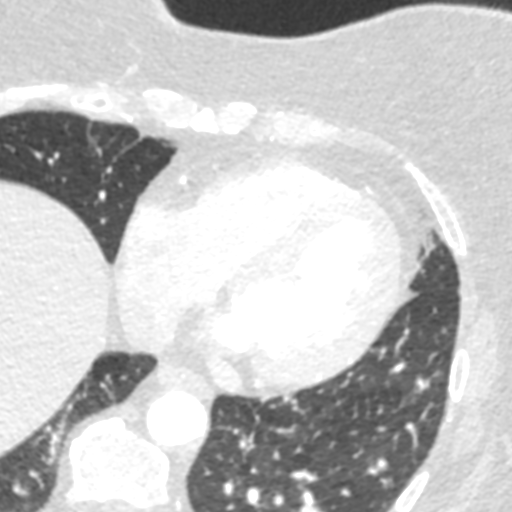
[im 195/293  vessel]
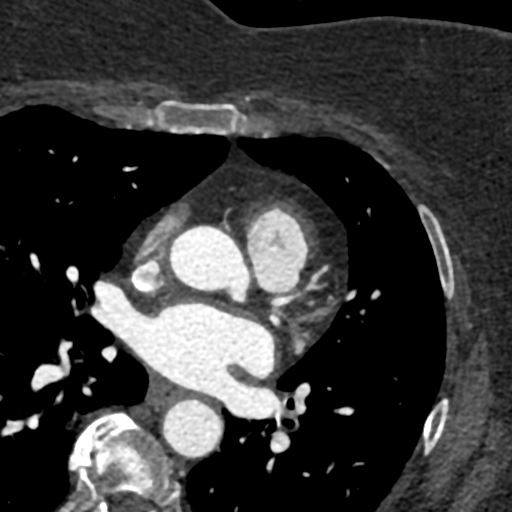

[Series 7: best syst · axial · 0.39mm/px · z∈[-392,-353]mm · 2 of 293 slices shown]
[im 98/293  vessel]
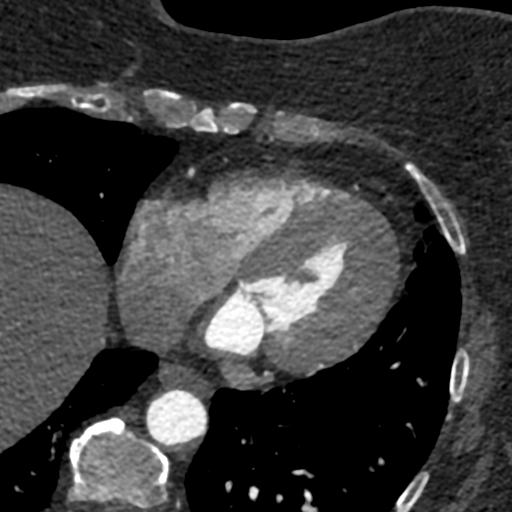
[im 195/293  vessel]
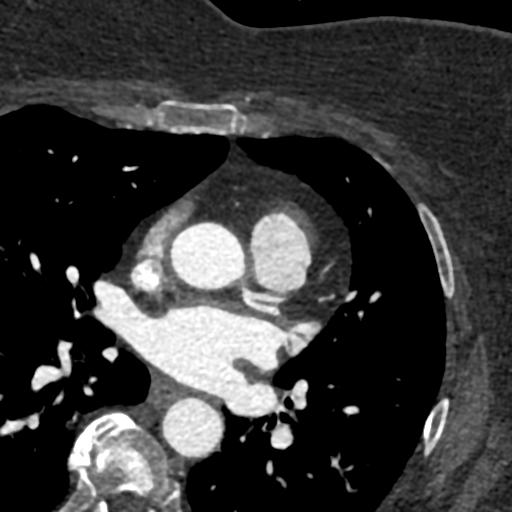

[Series 8: ts diast sharp · axial · 0.39mm/px · z∈[-392,-353]mm · 2 of 293 slices shown]
[im 98/293  lung]
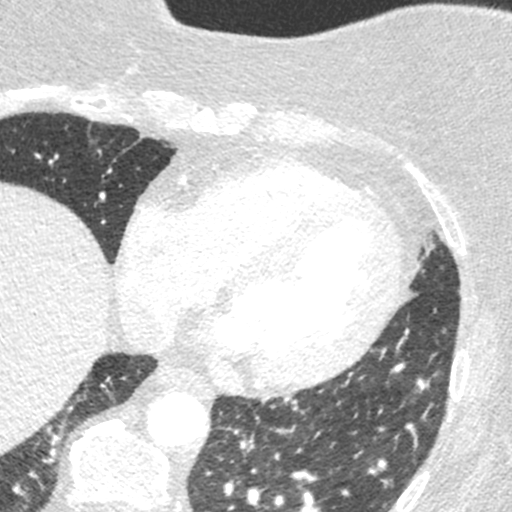
[im 195/293  lung]
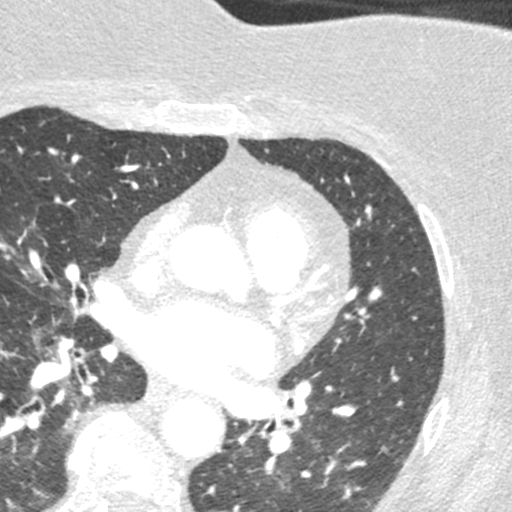

[Series 9: ts syst sharp · axial · 0.39mm/px · z∈[-392,-353]mm · 2 of 293 slices shown]
[im 98/293  lung]
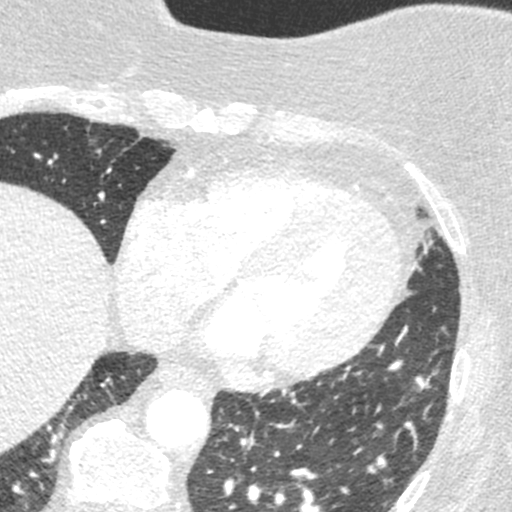
[im 195/293  lung]
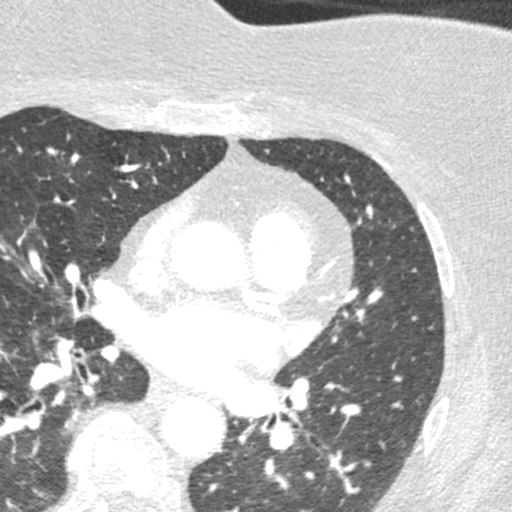

[8 of 20 positions shown; findings below may reference images not displayed]

FINDINGS: Within the visualized portions of the thorax there are no suspicious
appearing pulmonary nodules or masses, there is no acute
consolidative airspace disease, no pleural effusions, no
pneumothorax and no lymphadenopathy. Visualized portions of the
upper abdomen are unremarkable. There are no aggressive appearing
lytic or blastic lesions noted in the visualized portions of the
skeleton.
IMPRESSION: No significant incidental noncardiac findings are noted.
FINDINGS: A 120 kV prospective scan was triggered in the descending thoracic
aorta at 111 HU's. Axial non-contrast 3 mm slices were carried out
through the heart. The data set was analyzed on a dedicated work
station and scored using the Agatson method. Gantry rotation speed
was 250 msecs and collimation was .6 mm. No beta blockade and 0.8 mg
of sl NTG was given. The 3D data set was reconstructed in 5%
intervals of the 67-82 % of the R-R cycle. Diastolic phases were
analyzed on a dedicated work station using MPR, MIP and VRT modes.
The patient received 80 cc of contrast.

Aorta: Normal size. Ascending aorta 2.8 cm. No calcifications. No
dissection.

Aortic Valve:  Trileaflet.  No calcifications.

Coronary Arteries:  Normal coronary origin.  Right dominance.

RCA is a large dominant artery that gives rise to PDA and PLVB.
There is no plaque.

Left main is a large artery that gives rise to LAD and LCX arteries.

LAD is a large vessel that has no plaque. There are two diagonal
vessels without plaque.

LCX is a non-dominant artery that gives rise to one large OM1
branch. There is no plaque.

Other findings:

Normal pulmonary vein drainage into the left atrium.

Normal let atrial appendage without a thrombus.

Normal size of the pulmonary artery.

Mild mitral annular calcification.
IMPRESSION: 1. Coronary calcium score of 0. This was 0 percentile for age and
sex matched control.

2. Normal coronary origin with right dominance.

3. No evidence of CAD.

*** End of Addendum ***
EXAM:
OVER-READ INTERPRETATION  CT CHEST

The following report is an over-read performed by radiologist Dr.
Saurabh Haugen [REDACTED] on 12/29/2019. This
over-read does not include interpretation of cardiac or coronary
anatomy or pathology. The coronary calcium score/coronary CTA
interpretation by the cardiologist is attached.
FINDINGS: Within the visualized portions of the thorax there are no suspicious
appearing pulmonary nodules or masses, there is no acute
consolidative airspace disease, no pleural effusions, no
pneumothorax and no lymphadenopathy. Visualized portions of the
upper abdomen are unremarkable. There are no aggressive appearing
lytic or blastic lesions noted in the visualized portions of the
skeleton.
IMPRESSION: No significant incidental noncardiac findings are noted.

## 2021-12-09 ENCOUNTER — Other Ambulatory Visit (HOSPITAL_BASED_OUTPATIENT_CLINIC_OR_DEPARTMENT_OTHER): Payer: Medicare PPO

## 2021-12-09 ENCOUNTER — Ambulatory Visit (HOSPITAL_BASED_OUTPATIENT_CLINIC_OR_DEPARTMENT_OTHER): Payer: Medicare PPO

## 2021-12-10 ENCOUNTER — Ambulatory Visit (HOSPITAL_BASED_OUTPATIENT_CLINIC_OR_DEPARTMENT_OTHER): Payer: Medicare PPO | Admitting: Family

## 2021-12-10 ENCOUNTER — Encounter (HOSPITAL_BASED_OUTPATIENT_CLINIC_OR_DEPARTMENT_OTHER): Payer: Self-pay | Admitting: Family

## 2021-12-10 ENCOUNTER — Ambulatory Visit (HOSPITAL_BASED_OUTPATIENT_CLINIC_OR_DEPARTMENT_OTHER)
Admission: RE | Admit: 2021-12-10 | Discharge: 2021-12-10 | Disposition: A | Discharge status: Home / Self Care | Payer: Medicare PPO | Source: Ambulatory Visit | Attending: Medical | Admitting: Medical

## 2021-12-10 ENCOUNTER — Ambulatory Visit (INDEPENDENT_AMBULATORY_CARE_PROVIDER_SITE_OTHER): Payer: Medicare PPO | Admitting: Medical

## 2021-12-10 ENCOUNTER — Encounter (HOSPITAL_BASED_OUTPATIENT_CLINIC_OR_DEPARTMENT_OTHER): Payer: Self-pay

## 2021-12-10 VITALS — BP 124/72 | HR 90 | Ht <= 58 in | Wt 206.0 lb

## 2021-12-10 VITALS — BP 130/76 | HR 77 | Temp 97.7°F | Resp 18 | Ht <= 58 in | Wt 205.2 lb

## 2021-12-10 DIAGNOSIS — E785 Hyperlipidemia, unspecified: Secondary | ICD-10-CM

## 2021-12-10 DIAGNOSIS — E119 Type 2 diabetes mellitus without complications: Secondary | ICD-10-CM | POA: Diagnosis not present

## 2021-12-10 DIAGNOSIS — M25562 Pain in left knee: Secondary | ICD-10-CM | POA: Diagnosis not present

## 2021-12-10 DIAGNOSIS — G8929 Other chronic pain: Secondary | ICD-10-CM | POA: Diagnosis not present

## 2021-12-10 DIAGNOSIS — I10 Essential (primary) hypertension: Secondary | ICD-10-CM

## 2021-12-10 DIAGNOSIS — M8580 Other specified disorders of bone density and structure, unspecified site: Secondary | ICD-10-CM | POA: Diagnosis not present

## 2021-12-10 DIAGNOSIS — E1169 Type 2 diabetes mellitus with other specified complication: Secondary | ICD-10-CM

## 2021-12-10 DIAGNOSIS — Z780 Asymptomatic menopausal state: Secondary | ICD-10-CM | POA: Insufficient documentation

## 2021-12-10 DIAGNOSIS — Z1231 Encounter for screening mammogram for malignant neoplasm of breast: Secondary | ICD-10-CM

## 2021-12-10 DIAGNOSIS — R5383 Other fatigue: Secondary | ICD-10-CM | POA: Diagnosis not present

## 2021-12-10 DIAGNOSIS — M8589 Other specified disorders of bone density and structure, multiple sites: Secondary | ICD-10-CM | POA: Diagnosis not present

## 2021-12-10 LAB — COMPREHENSIVE METABOLIC PANEL
ALT: 11 U/L (ref 0–35)
AST: 15 U/L (ref 0–37)
Albumin: 4.3 g/dL (ref 3.5–5.2)
Alkaline Phosphatase: 90 U/L (ref 39–117)
BUN: 19 mg/dL (ref 6–23)
CO2: 34 mEq/L — ABNORMAL HIGH (ref 19–32)
Calcium: 9.6 mg/dL (ref 8.4–10.5)
Chloride: 95 mEq/L — ABNORMAL LOW (ref 96–112)
Creatinine, Ser: 0.58 mg/dL (ref 0.40–1.20)
GFR: 89.48 mL/min (ref >60.00)
Glucose, Bld: 110 mg/dL — ABNORMAL HIGH (ref 70–99)
Potassium: 3.5 mEq/L (ref 3.5–5.1)
Sodium: 139 mEq/L (ref 135–145)
Total Bilirubin: 0.5 mg/dL (ref 0.2–1.2)
Total Protein: 7.2 g/dL (ref 6.0–8.3)

## 2021-12-10 LAB — CBC WITH DIFFERENTIAL/PLATELET
Basophils Absolute: 0 10*3/uL (ref 0.0–0.1)
Basophils Relative: 0.5 % (ref 0.0–3.0)
Eosinophils Absolute: 0.5 10*3/uL (ref 0.0–0.7)
Eosinophils Relative: 5.1 % — ABNORMAL HIGH (ref 0.0–5.0)
HCT: 46.9 % — ABNORMAL HIGH (ref 36.0–46.0)
Hemoglobin: 15.6 g/dL — ABNORMAL HIGH (ref 12.0–15.0)
Lymphocytes Relative: 22.8 % (ref 12.0–46.0)
Lymphs Abs: 2.2 10*3/uL (ref 0.7–4.0)
MCHC: 33.2 g/dL (ref 30.0–36.0)
MCV: 85.5 fl (ref 78.0–100.0)
Monocytes Absolute: 0.8 10*3/uL (ref 0.1–1.0)
Monocytes Relative: 8.4 % (ref 3.0–12.0)
Neutro Abs: 6.2 10*3/uL (ref 1.4–7.7)
Neutrophils Relative %: 63.2 % (ref 43.0–77.0)
Platelets: 360 10*3/uL (ref 150.0–400.0)
RBC: 5.48 Mil/uL — ABNORMAL HIGH (ref 3.87–5.11)
RDW: 14.1 % (ref 11.5–15.5)
WBC: 9.8 10*3/uL (ref 4.0–10.5)

## 2021-12-10 LAB — T4, FREE: Free T4: 1.02 ng/dL (ref 0.60–1.60)

## 2021-12-10 LAB — LIPID PANEL
Cholesterol: 231 mg/dL — ABNORMAL HIGH (ref 0–200)
HDL: 48 mg/dL (ref >39.00)
LDL Cholesterol: 163 mg/dL — ABNORMAL HIGH (ref 0–99)
NonHDL: 183.27
Total CHOL/HDL Ratio: 5
Triglycerides: 101 mg/dL (ref 0.0–149.0)
VLDL: 20.2 mg/dL (ref 0.0–40.0)

## 2021-12-10 LAB — TSH: TSH: 1.45 u[IU]/mL (ref 0.35–5.50)

## 2021-12-10 LAB — HEMOGLOBIN A1C: Hgb A1c MFr Bld: 6.5 % (ref 4.6–6.5)

## 2021-12-10 LAB — VITAMIN D 25 HYDROXY (VIT D DEFICIENCY, FRACTURES): VITD: 21.08 ng/mL — ABNORMAL LOW (ref 30.00–100.00)

## 2021-12-10 MED ORDER — LOSARTAN POTASSIUM 25 MG PO TABS: 37.5000 mg | ORAL_TABLET | Freq: Every day | ORAL | 3 refills | Status: AC

## 2021-12-10 MED ORDER — HYDROCODONE-ACETAMINOPHEN 5-325 MG PO TABS: 1.0000 | ORAL_TABLET | Freq: Four times a day (QID) | ORAL | 0 refills | Status: DC | PRN

## 2021-12-10 NOTE — Patient Instructions (Addendum)
Diabetes- well controlled historically with just diet.  Will get A1c today.  Htn- cmp today. Continue losartan and hctz. Get opinion from cardiologist today as well.  High cholesterol- get lipid panel today.  For osteopenia- will get vit d level. Take otc calcium supplementation. Will update you if need vit D supplament. Offered bisphosphanate but declined.Repeat dexascan in 2 years.  For fatigue get tsh and t4.  For left knee pain need copy of your prior xray. Can use ibuprofen 400-600 mg every 8 hours if needed. At your request limited # of lower doe norco to use sparingly for severe pain.  Follow up date to be determined after lab review.

## 2021-12-10 NOTE — Progress Notes (Signed)
Office Visit    Patient Name: Amber Huffman Date of Encounter: 12/10/2021  PCP:  Huffman, Amber, [place]  Cardiologist:  Amber Latch, MD  Advanced Practice Provider:  No care team member to display Electrophysiologist:  None    Chief Complaint    Amber Huffman is a 71 y.o. female with a hx of obesity, hypertension, hyperlipidemia presents today for hypertension follow up.    Past Medical History    Past Medical History:  Diagnosis Date   Allergy    Anemia    Anxiety    Arthritis    Thumbs   Blood transfusion without reported diagnosis    Depression    Diabetes mellitus without complication ([place])    type 2   GERD (gastroesophageal reflux disease)    Hyperlipidemia    Hypertension    Morbid obesity with BMI of 50.0-59.9, adult ([place])    Osteoporosis    Palpitations 07/31/2021   Pneumonia    Substance abuse ([place])    ETOH abuse, quit drinking in 1985   Past Surgical History:  Procedure Laterality Date   ABDOMINAL HYSTERECTOMY     CERVIX REMOVAL     CESAREAN SECTION     COLONOSCOPY     OOPHORECTOMY     UPPER GASTROINTESTINAL ENDOSCOPY     WISDOM TOOTH EXTRACTION      Allergies  Allergies  Allergen Reactions   Penicillins Rash    Has patient had a PCN reaction causing immediate rash, facial/tongue/throat swelling, SOB or lightheadedness with hypotension: Yes Has patient had a PCN reaction causing severe rash involving mucus membranes or skin necrosis: No Has patient had a PCN reaction that required hospitalization: No Has patient had a PCN reaction occurring within the last 10 years: No If all of the above answers are "NO", then may proceed with Cephalosporin use.    History of Present Illness    Amber Huffman is a 71 y.o. female with a hx of obesity, hypertension, hyperlipidemia last seen 02/22/2020.  He was initially seen in the hospital October 2019 after presenting with chest pain and diagnosed with hypertension  and hyperlipidemia.  Cardiac enzymes are negative and EKG unremarkable.  Outpatient stress testing no ischemia nor infarction.  Echo LVEF 60 to 97%, diastolic dysfunction.  Did not tolerate atorvastatin due to myalgias.  She had coronary CTA 12/2019 with coronary calcium score of 0.  She was last seen in clinic 02/22/2020 doing overall well from a cardiac perspective.  She has some abdominal discomfort due to gallbladder.  She was grieving the loss of her sister from COVID-74.  She presents today for follow-up.  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  Shares with me that she is still grieving the loss of her sister and that since she saw Amber Huffman she lost her son to a very fast 6 weeks of a lung cancer nearly 1 year ago.  April 13 will be the anniversary of her son's passing.  She does have good support system and her sisters and her church.  She saw PCP 03/28/2021 with elevated blood pressure without prefer to remain on fewer agents overall.  He was recommended to discontinue amlodipine and start losartan.  Has not made the change yet.  He denies chest pain, pressure, tightness.  Reports exertional dyspnea with only more than usual activity.  She notes she has picked up weight and is hopeful to get back to some activity to lose weight.  Notes her blood pressure has been elevated and we reviewed blood pressure goal less than 130/80.  7/23 started on semaglutide by pharmacy team. However she later opted to self discontinue. Did not have side effects.   Presents today for follow up independently. Notes that October through March has moved to [facility] which she enjoys. Saw PCP this morning and labs collected. BP at home or pharmacy most often >130/80. She does note she has been getting a tremor in her right hand. It will occur randomly when she is reaching for something. No loss of grip. Discussed may be related to possible hypoglycemia as she notes going long periods of time without eating. Notes she has  been watching too much tv and not moving much. Recently found [facility] center at [facility] and encouraged ot participate in activities.   EKGs/Labs/Other Studies Reviewed:   The following studies were reviewed today:  Echo 11/12/17: Study Conclusions   - Left ventricle: The cavity size was normal. There was moderate   concentric hypertrophy. Systolic function was normal. The   estimated ejection fraction was in the range of 60% to 65%. Wall   motion was normal; there were no regional wall motion   abnormalities. There was an increased relative contribution of   atrial contraction to ventricular filling. Doppler parameters are   consistent with abnormal left ventricular relaxation (grade 1   diastolic dysfunction). - Mitral valve: Calcified annulus. There was trivial regurgitation.   Lexiscan Myoview 11/13/17: Nuclear stress EF: 81%. The left ventricular ejection fraction is hyperdynamic (>65%). There was no ST segment deviation noted during stress. The study is normal. This is a low risk study.   Normal resting and stress perfusion. No ischemia or infarction EF 81%   Coronary CT-A 12/21: IMPRESSION: 1. Coronary calcium score of 0. This was 0 percentile for age and sex matched control.   2. Normal coronary origin with right dominance.   3. No evidence of CAD.   Carotid Doppler 09/2019: IMPRESSION: 1. Bilateral carotid bifurcation plaque resulting in less than 50% diameter ICA stenosis. 2. Antegrade bilateral vertebral arterial flow. 3. Hypertension  EKG:  EKG is not ordered today.  Recent Labs: 07/31/2021: ALT 12; BUN 20; Creatinine, Ser 0.64; Magnesium 2.2; Potassium 4.2; Sodium 139; TSH 2.050  Recent Lipid Panel    Component Value Date/Time   CHOL 198 07/31/2021 1134   TRIG 153 (H) 07/31/2021 1134   HDL 47 07/31/2021 1134   CHOLHDL 4.2 07/31/2021 1134   CHOLHDL 5 02/23/2019 1315   VLDL 25.2 02/23/2019 1315   LDLCALC 124 (H) 07/31/2021 1134   Home  Medications   Current Meds  Medication Sig   albuterol (VENTOLIN HFA) 108 (90 Base) MCG/ACT inhaler INHALE 2 PUFFS BY MOUTH EVERY 6 HOURS AS NEEDED   hydrochlorothiazide (HYDRODIURIL) 25 MG tablet Take 1 tablet (25 mg total) by mouth daily.   HYDROcodone-acetaminophen (NORCO) 5-325 MG tablet Take 1 tablet by mouth every 6 (six) hours as needed for moderate pain.   ibuprofen (ADVIL) 600 MG tablet TAKE 1 TABLET BY MOUTH EVERY 8 HOURS AS NEEDED   losartan (COZAAR) 25 MG tablet Take 1 tablet (25 mg total) by mouth daily.     Review of Systems      All other systems reviewed and are otherwise negative except as noted above.  Physical Exam    VS:  BP 124/72   Pulse 90   Ht '4\' 10"'$  (1.473 m)   Wt 206 lb (93.4 kg)  BMI 43.05 kg/m  , BMI Body mass index is 43.05 kg/m.  Wt Readings from Last 3 Encounters:  12/10/21 206 lb (93.4 kg)  12/10/21 205 lb 3.2 oz (93.1 kg)  08/08/21 218 lb 6.4 oz (99.1 kg)    GEN: Well nourished, overweight, well developed, in no acute distress. HEENT: normal. Neck: Supple, no JVD, carotid bruits, or masses. Cardiac: RRR, no murmurs, rubs, or gallops. No clubbing, cyanosis, edema.  Radials/PT 2+ and equal bilaterally.  Respiratory:  Respirations regular and unlabored, clear to auscultation bilaterally. GI: Soft, nontender, nondistended. MS: No deformity or atrophy. Skin: Warm and dry, no rash. Neuro:  Strength and sensation are intact. Psych: Normal affect.  Assessment & Plan    HTN -BP at goal in clinic but reports often >130/80 when checked at home.   Increase Losartan to 37.'5mg'$  QD. Continue HCTZ. Heart healthy diet and regular cardiovascular exercise encouraged.  Discussed to monitor BP at home at least 2 hours after medications and sitting for 5-10 minutes.   Pre diabetes /Morbid obesity - Weight loss via diet and exercise encouraged. Discussed the impact being overweight would have on cardiovascular risk. Interested in  [place] when she [place]  beach in April. Discuss at follow up. Took Ozempic for a few months but self discontinued and wishes to remain off.    Disposition: Follow up  04/2022  with Amber Latch, MD or APP.  Signed, Loel Dubonnet, NP 12/10/2021, 3:09 PM [place]

## 2021-12-10 NOTE — Progress Notes (Signed)
Subjective:    Patient ID: Amber Huffman, female    DOB: 1950/03/30, 71 y.o.   MRN: [id number]  HPI  Pt in for follow up.  Pt states had mammogram, bone density and will follow up with cardiologist office.  Today she states  having some left knee pain recently for past 2 weeks but had some pain in this knee before.. Pt states she did see orthopedist in the past. Pain level 6-7/10 when walking.  Pt states she is having some grief as going thru holidays without her son who passed.     HTN -on losartan and hctz.   Grief -handling well this year.    Pre diabetes /Morbid obesity - Weight loss via diet and exercise encouraged. Discussed the impact being overweight would have on cardiovascular risk.  Referred to prep program for exercise.  High cholesterol but cardiac work up was negative.     Review of Systems  Constitutional:  Positive for fatigue. Negative for chills and fever.  HENT:  Negative for congestion and ear discharge.   Respiratory:  Negative for cough, chest tightness, shortness of breath and wheezing.   Cardiovascular:  Negative for chest pain and palpitations.  Gastrointestinal:  Negative for abdominal pain, anal bleeding, blood in stool, diarrhea and vomiting.  Musculoskeletal:        Knee pain  Skin:  Negative for rash.  Neurological:  Negative for dizziness, numbness and headaches.  Hematological:  Negative for adenopathy. Does not bruise/bleed easily.  Psychiatric/Behavioral:  Negative for behavioral problems, decreased concentration, dysphoric mood and hallucinations. The patient is not nervous/anxious.     Past Medical History:  Diagnosis Date   Allergy    Anemia    Anxiety    Arthritis    Thumbs   Blood transfusion without reported diagnosis    Depression    Diabetes mellitus without complication ([place])    type 2   GERD (gastroesophageal reflux disease)    Hyperlipidemia    Hypertension    Morbid obesity with BMI of 50.0-59.9, adult ([place])     Osteoporosis    Palpitations 07/31/2021   Pneumonia    Substance abuse ([place])    ETOH abuse, quit drinking in 1985     Social History   Socioeconomic History   Marital status: Married    Spouse name: Not on file   Number of children: Not on file   Years of education: Not on file   Highest education level: Not on file  Occupational History   Not on file  Tobacco Use   Smoking status: Never    Passive exposure: Yes   Smokeless tobacco: Never  Vaping Use   Vaping Use: Never used  Substance and Sexual Activity   Alcohol use: Not Currently    Comment: 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   Drug use: Not Currently   Sexual activity: Not on file    Comment: Hysterectomy  Other Topics Concern   Not on file  Social History Narrative   Not on file   Social Determinants of Health   Financial Resource Strain: Low Risk  (06/17/2021)   Overall Financial Resource Strain (CARDIA)    Difficulty of Paying Living Expenses: Not hard at all  Food Insecurity: No Food Insecurity (06/17/2021)   Hunger Vital Sign    Worried About Running Out of Food in the Last Year: Never true  Ran Out of Food in the Last Year: Never true  Transportation Needs: No Transportation Needs (06/17/2021)   PRAPARE - Hydrologist (Medical): No    Lack of Transportation (Non-Medical): No  Physical Activity: Inactive (06/17/2021)   Exercise Vital Sign    Days of Exercise per Week: 0 days    Minutes of Exercise per Session: 0 min  Stress: No Stress Concern Present (06/17/2021)   [place]    Feeling of Stress : Not at all  Social Connections: Moderately Integrated (10/24/2017)   Social Connection and Isolation Panel [NHANES]    Frequency of Communication with Friends and Family: More than three times a week    Frequency of Social Gatherings  with Friends and Family: More than three times a week    Attends Religious Services: 1 to 4 times per year    Active Member of [employer] or Organizations: No    Attends Archivist Meetings: Never    Marital Status: Married  Human resources officer Violence: Not At Risk (06/17/2021)   Humiliation, Afraid, Rape, and Kick questionnaire    Fear of Current or Ex-Partner: No    Emotionally Abused: No    Physically Abused: No    Sexually Abused: No    Past Surgical History:  Procedure Laterality Date   ABDOMINAL HYSTERECTOMY     CERVIX REMOVAL     CESAREAN SECTION     COLONOSCOPY     OOPHORECTOMY     UPPER GASTROINTESTINAL ENDOSCOPY     WISDOM TOOTH EXTRACTION      Family History  Problem Relation Age of Onset   Heart disease Father        After the age of 22   Uterine cancer Mother    Colon cancer Neg Hx    Esophageal cancer Neg Hx    Rectal cancer Neg Hx    Stomach cancer Neg Hx     Allergies  Allergen Reactions   Penicillins Rash    Has patient had a PCN reaction causing immediate rash, facial/tongue/throat swelling, SOB or lightheadedness with hypotension: Yes Has patient had a PCN reaction causing severe rash involving mucus membranes or skin necrosis: No Has patient had a PCN reaction that required hospitalization: No Has patient had a PCN reaction occurring within the last 10 years: No If all of the above answers are "NO", then may proceed with Cephalosporin use.    Current Outpatient Medications on File Prior to Visit  Medication Sig Dispense Refill   albuterol (VENTOLIN HFA) 108 (90 Base) MCG/ACT inhaler INHALE 2 PUFFS BY MOUTH EVERY 6 HOURS AS NEEDED 9 g 0   hydrochlorothiazide (HYDRODIURIL) 25 MG tablet Take 1 tablet (25 mg total) by mouth daily. 90 tablet 1   ibuprofen (ADVIL) 600 MG tablet TAKE 1 TABLET BY MOUTH EVERY 8 HOURS AS NEEDED 30 tablet 0   losartan (COZAAR) 25 MG tablet Take 1 tablet (25 mg total) by mouth daily. 90 tablet 2   No current  facility-administered medications on file prior to visit.    BP 130/76   Pulse 77   Temp 97.7 F (36.5 C)   Resp 18   Ht '4\' 10"'$  (1.473 m)   Wt 205 lb 3.2 oz (93.1 kg)   SpO2 95%   BMI 42.89 kg/m        Objective:   Physical Exam  General- No acute distress. Pleasant patient. Neck- Full range of motion,  no jvd Lungs- Clear, even and unlabored. Heart- regular rate and rhythm. Neurologic- CNII- XII grossly intact.   Left knee pain- pain medial aspect on palpation over tibial plateau.        Assessment & Plan:   Patient Instructions  Diabetes- well controlled historically with just diet.  Will get A1c today.  Htn- cmp today. Continue losartan and hctz. Get opinion from cardiologist today as well.  High cholesterol- get lipid panel today.  For osteopenia- will get vit d level. Take otc calcium supplementation. Will update you if need vit D supplament. Offered bisphosphanate but declined.Repeat dexascan in 2 years.  For fatigue get tsh and t4.  For left knee pain need copy of your prior xray. Can use ibuprofen 400-600 mg every 8 hours if needed. At your request limited # of lower doe norco to use sparingly for severe pain.  Follow up date to be determined after lab review.   Mackie Pai, PA-C

## 2021-12-10 NOTE — Patient Instructions (Addendum)
Medication Instructions:  Your physician has recommended you make the following change in your medication:   INCREASE Losartan to 1.5 tablets daily  *If you need a refill on your cardiac medications before your next appointment, please call your pharmacy*   Lab Work/Testing/Procedures: None ordered today.   Follow-Up: At [facility], you and your health needs are our priority.  As part of our continuing mission to provide you with exceptional heart care, we have created designated Provider Care Teams.  These Care Teams include your primary Cardiologist (physician) and Advanced Practice Providers (APPs -  Physician Assistants and Nurse Practitioners) who all work together to provide you with the care you need, when you need it.  We recommend signing up for the patient portal called "MyChart".  Sign up information is provided on this After Visit Summary.  MyChart is used to connect with patients for Virtual Visits (Telemedicine).  Patients are able to view lab/test results, encounter notes, upcoming appointments, etc.  Non-urgent messages can be sent to your provider as well.   To learn more about what you can do with MyChart, go to [email or web address].    Your next appointment:   April 2024 In Person with Skeet Latch, MD or Laurann Montana, NP    Other Instructions  Heart Healthy Diet Recommendations: A low-salt diet is recommended. Meats should be grilled, baked, or boiled. Avoid fried foods. Focus on lean protein sources like fish or chicken with vegetables and fruits. The American Heart Association is a Microbiologist!  American Heart Association Diet and Lifeystyle Recommendations   Exercise recommendations: The American Heart Association recommends 150 minutes of moderate intensity exercise weekly. Try 30 minutes of moderate intensity exercise 4-5 times per week. This could include walking, jogging, or swimming.  Tips to Measure your Blood Pressure  Correctly  Here's what you can do to ensure a correct reading:  Don't drink a caffeinated beverage or smoke during the 30 minutes before the test.  Sit quietly for five minutes before the test begins.  During the measurement, sit in a chair with your feet on the floor and your arm supported so your elbow is at about heart level.  The inflatable part of the cuff should completely cover at least 80% of your upper arm, and the cuff should be placed on bare skin, not over a shirt.  Don't talk during the measurement.   Blood pressure categories  Blood pressure category SYSTOLIC (upper number)  DIASTOLIC (lower number)  Normal Less than 120 mm Hg and Less than 80 mm Hg  Elevated 120-129 mm Hg and Less than 80 mm Hg  High blood pressure: Stage 1 hypertension 130-139 mm Hg or 80-89 mm Hg  High blood pressure: Stage 2 hypertension 140 mm Hg or higher or 90 mm Hg or higher  Hypertensive crisis (consult your doctor immediately) Higher than 180 mm Hg and/or Higher than 120 mm Hg  Source: American Heart Association and American Stroke Association. For more on getting your blood pressure under control, buy Controlling Your Blood Pressure, a Special Health Report from [facility].   Blood Pressure Log   Date   Time  Blood Pressure  Example: Nov 1 9 AM 124/78

## 2021-12-11 ENCOUNTER — Telehealth: Payer: Self-pay | Admitting: Medical

## 2021-12-11 MED ORDER — HYDROCODONE-ACETAMINOPHEN 5-325 MG PO TABS: 1.0000 | ORAL_TABLET | Freq: Four times a day (QID) | ORAL | 0 refills | Status: AC | PRN

## 2021-12-11 NOTE — Telephone Encounter (Signed)
Pt called to ask if we had received the xray that was sent over from [facility] as urgent. After reviewing chart, advised pt that we could see the notes from that visit but not the xrays themselves and was unsure as to how those were handled from outside offices. Advsied pt that a note would be sent back to see if we had gotten them and would call her back once we knew more. Pt would also like to go over results from said x-ray when available.

## 2021-12-11 NOTE — Telephone Encounter (Signed)
Requested imaging results from [facility]

## 2021-12-11 NOTE — Telephone Encounter (Signed)
Script cancelled , new pharmacy loaded

## 2021-12-11 NOTE — Telephone Encounter (Signed)
Pt called and lvm to return call to let pt know I have requested xrays

## 2021-12-11 NOTE — Telephone Encounter (Addendum)
Pt need the following Rx rerouted to the following pharmacy:  Medication:   HYDROcodone-acetaminophen ([place]) 5-325 MG tablet [[id number]]   Has the patient contacted their pharmacy? No. (If no, request that the patient contact the pharmacy for the refill.) (If yes, when and what did the pharmacy advise?)  Preferred Pharmacy (with phone number or street name):   [facility] [address] [place], [place] [address] P: [phone]  Agent: Please be advised that RX refills may take up to 3 business days. We ask that you follow-up with your pharmacy.  Rx refill sent to pharmacy.  Mackie Pai, PA-C

## 2021-12-12 NOTE — Telephone Encounter (Signed)
Pt called and lvm to return call 

## 2021-12-12 NOTE — Telephone Encounter (Signed)
Xray report placed in your basket

## 2021-12-16 ENCOUNTER — Telehealth: Payer: Self-pay | Admitting: Medical

## 2021-12-16 NOTE — Telephone Encounter (Signed)
Patient would like a call back to go over results and get them mailed to another address. Please advise.

## 2021-12-16 NOTE — Telephone Encounter (Signed)
Pt called and lvm to return call  Will send letter

## 2021-12-20 NOTE — Telephone Encounter (Signed)
Pt called back , labs reviewed .Marland Kitchen Denied steroid injection

## 2021-12-20 NOTE — Telephone Encounter (Signed)
Labs reviewed.

## 2022-01-15 DIAGNOSIS — R1032 Left lower quadrant pain: Secondary | ICD-10-CM | POA: Diagnosis not present

## 2022-01-17 DIAGNOSIS — Z6841 Body Mass Index (BMI) 40.0 and over, adult: Secondary | ICD-10-CM | POA: Diagnosis not present

## 2022-01-17 DIAGNOSIS — R109 Unspecified abdominal pain: Secondary | ICD-10-CM | POA: Diagnosis not present

## 2022-01-17 DIAGNOSIS — R1032 Left lower quadrant pain: Secondary | ICD-10-CM | POA: Diagnosis not present

## 2022-01-17 DIAGNOSIS — K6389 Other specified diseases of intestine: Secondary | ICD-10-CM | POA: Diagnosis not present

## 2022-01-17 DIAGNOSIS — E669 Obesity, unspecified: Secondary | ICD-10-CM | POA: Diagnosis not present

## 2022-01-17 DIAGNOSIS — I10 Essential (primary) hypertension: Secondary | ICD-10-CM | POA: Diagnosis not present

## 2022-01-17 DIAGNOSIS — J45909 Unspecified asthma, uncomplicated: Secondary | ICD-10-CM | POA: Diagnosis not present

## 2022-01-21 DIAGNOSIS — M1711 Unilateral primary osteoarthritis, right knee: Secondary | ICD-10-CM | POA: Diagnosis not present

## 2022-01-21 DIAGNOSIS — G5601 Carpal tunnel syndrome, right upper limb: Secondary | ICD-10-CM | POA: Diagnosis not present

## 2022-02-03 DIAGNOSIS — R059 Cough, unspecified: Secondary | ICD-10-CM | POA: Diagnosis not present

## 2022-02-05 DIAGNOSIS — U071 COVID-19: Secondary | ICD-10-CM | POA: Diagnosis not present

## 2022-02-05 DIAGNOSIS — J029 Acute pharyngitis, unspecified: Secondary | ICD-10-CM | POA: Diagnosis not present

## 2022-02-06 ENCOUNTER — Telehealth: Payer: Self-pay | Admitting: Medical

## 2022-02-06 MED ORDER — IBUPROFEN 600 MG PO TABS: 600.0000 mg | ORAL_TABLET | Freq: Three times a day (TID) | ORAL | 0 refills | Status: AC | PRN

## 2022-02-06 MED ORDER — ALBUTEROL SULFATE HFA 108 (90 BASE) MCG/ACT IN AERS: 2.0000 | INHALATION_SPRAY | Freq: Four times a day (QID) | RESPIRATORY_TRACT | 0 refills | Status: AC | PRN

## 2022-02-06 NOTE — Telephone Encounter (Signed)
Rx sent 

## 2022-02-06 NOTE — Telephone Encounter (Signed)
Pt needs a virtual visit

## 2022-02-06 NOTE — Telephone Encounter (Signed)
Patient is currently staying at [facility] and was diagnosed with Covid yesterday. Symptoms started Monday evening. Patient is requesting a refill on her:   albuterol (VENTOLIN HFA) 108 (90 Base) MCG/ACT inhaler   ibuprofen (ADVIL) 600 MG tablet [[id number]]   Patient said she has tried stuff otc for her cough but it isn't helping so she would appreciate it if he could call in something for cough too.   Please send to [place], [place], [place]

## 2022-02-27 DIAGNOSIS — J069 Acute upper respiratory infection, unspecified: Secondary | ICD-10-CM | POA: Diagnosis not present

## 2022-02-27 DIAGNOSIS — R300 Dysuria: Secondary | ICD-10-CM | POA: Diagnosis not present

## 2022-02-27 DIAGNOSIS — J029 Acute pharyngitis, unspecified: Secondary | ICD-10-CM | POA: Diagnosis not present

## 2022-04-22 ENCOUNTER — Ambulatory Visit (HOSPITAL_BASED_OUTPATIENT_CLINIC_OR_DEPARTMENT_OTHER): Payer: Medicare PPO | Admitting: Cardiovascular Disease

## 2022-04-30 ENCOUNTER — Encounter: Payer: Self-pay | Admitting: *Deleted

## 2022-05-07 DIAGNOSIS — Z7689 Persons encountering health services in other specified circumstances: Secondary | ICD-10-CM | POA: Diagnosis not present

## 2022-05-07 DIAGNOSIS — E782 Mixed hyperlipidemia: Secondary | ICD-10-CM | POA: Diagnosis not present

## 2022-05-07 DIAGNOSIS — R7303 Prediabetes: Secondary | ICD-10-CM | POA: Diagnosis not present

## 2022-05-07 DIAGNOSIS — Z6841 Body Mass Index (BMI) 40.0 and over, adult: Secondary | ICD-10-CM | POA: Diagnosis not present

## 2022-05-07 DIAGNOSIS — I10 Essential (primary) hypertension: Secondary | ICD-10-CM | POA: Diagnosis not present

## 2022-05-07 DIAGNOSIS — F411 Generalized anxiety disorder: Secondary | ICD-10-CM | POA: Diagnosis not present

## 2022-05-07 DIAGNOSIS — E559 Vitamin D deficiency, unspecified: Secondary | ICD-10-CM | POA: Diagnosis not present

## 2022-06-17 ENCOUNTER — Ambulatory Visit (HOSPITAL_BASED_OUTPATIENT_CLINIC_OR_DEPARTMENT_OTHER): Payer: Medicare PPO | Admitting: Family

## 2022-09-16 DIAGNOSIS — Z6841 Body Mass Index (BMI) 40.0 and over, adult: Secondary | ICD-10-CM | POA: Diagnosis not present

## 2022-09-16 DIAGNOSIS — R413 Other amnesia: Secondary | ICD-10-CM | POA: Diagnosis not present

## 2022-09-16 DIAGNOSIS — Z136 Encounter for screening for cardiovascular disorders: Secondary | ICD-10-CM | POA: Diagnosis not present

## 2022-09-16 DIAGNOSIS — Z131 Encounter for screening for diabetes mellitus: Secondary | ICD-10-CM | POA: Diagnosis not present

## 2022-09-16 DIAGNOSIS — F411 Generalized anxiety disorder: Secondary | ICD-10-CM | POA: Diagnosis not present

## 2022-09-16 DIAGNOSIS — E559 Vitamin D deficiency, unspecified: Secondary | ICD-10-CM | POA: Diagnosis not present

## 2022-09-16 DIAGNOSIS — Z13228 Encounter for screening for other metabolic disorders: Secondary | ICD-10-CM | POA: Diagnosis not present

## 2022-10-09 DIAGNOSIS — E782 Mixed hyperlipidemia: Secondary | ICD-10-CM | POA: Diagnosis not present

## 2022-10-09 DIAGNOSIS — F411 Generalized anxiety disorder: Secondary | ICD-10-CM | POA: Diagnosis not present

## 2022-10-09 DIAGNOSIS — R7303 Prediabetes: Secondary | ICD-10-CM | POA: Diagnosis not present

## 2022-10-09 DIAGNOSIS — I10 Essential (primary) hypertension: Secondary | ICD-10-CM | POA: Diagnosis not present

## 2022-10-09 DIAGNOSIS — R413 Other amnesia: Secondary | ICD-10-CM | POA: Diagnosis not present

## 2022-10-09 DIAGNOSIS — L209 Atopic dermatitis, unspecified: Secondary | ICD-10-CM | POA: Diagnosis not present

## 2022-10-09 DIAGNOSIS — Z1231 Encounter for screening mammogram for malignant neoplasm of breast: Secondary | ICD-10-CM | POA: Diagnosis not present

## 2022-10-09 DIAGNOSIS — Z0000 Encounter for general adult medical examination without abnormal findings: Secondary | ICD-10-CM | POA: Diagnosis not present

## 2022-10-09 DIAGNOSIS — Z6841 Body Mass Index (BMI) 40.0 and over, adult: Secondary | ICD-10-CM | POA: Diagnosis not present

## 2022-11-13 ENCOUNTER — Telehealth: Payer: Self-pay | Admitting: Medical

## 2022-11-13 NOTE — Telephone Encounter (Signed)
Patient said she moved away to [facility] but we keep calling her. Removed [name] Richters as PCP

## 2022-12-05 ENCOUNTER — Telehealth: Payer: Self-pay

## 2022-12-05 NOTE — Patient Outreach (Signed)
Attempted to contact patient regarding care gaps. Left voicemail for patient to return my call at [phone].  Nicholes Rough, CMA Care Guide [employer]

## 2022-12-17 DIAGNOSIS — Z1231 Encounter for screening mammogram for malignant neoplasm of breast: Secondary | ICD-10-CM | POA: Diagnosis not present

## 2022-12-17 DIAGNOSIS — H5310 Unspecified subjective visual disturbances: Secondary | ICD-10-CM | POA: Diagnosis not present

## 2023-01-08 DIAGNOSIS — R059 Cough, unspecified: Secondary | ICD-10-CM | POA: Diagnosis not present

## 2023-01-08 DIAGNOSIS — Z6841 Body Mass Index (BMI) 40.0 and over, adult: Secondary | ICD-10-CM | POA: Diagnosis not present

## 2023-03-09 DIAGNOSIS — L209 Atopic dermatitis, unspecified: Secondary | ICD-10-CM | POA: Diagnosis not present

## 2023-04-01 DIAGNOSIS — R7303 Prediabetes: Secondary | ICD-10-CM | POA: Diagnosis not present

## 2023-04-01 DIAGNOSIS — E782 Mixed hyperlipidemia: Secondary | ICD-10-CM | POA: Diagnosis not present

## 2023-04-01 DIAGNOSIS — I10 Essential (primary) hypertension: Secondary | ICD-10-CM | POA: Diagnosis not present

## 2023-04-01 DIAGNOSIS — R300 Dysuria: Secondary | ICD-10-CM | POA: Diagnosis not present

## 2023-04-08 DIAGNOSIS — F411 Generalized anxiety disorder: Secondary | ICD-10-CM | POA: Diagnosis not present

## 2023-04-08 DIAGNOSIS — Z6841 Body Mass Index (BMI) 40.0 and over, adult: Secondary | ICD-10-CM | POA: Diagnosis not present

## 2023-04-08 DIAGNOSIS — J40 Bronchitis, not specified as acute or chronic: Secondary | ICD-10-CM | POA: Diagnosis not present

## 2023-04-08 DIAGNOSIS — I10 Essential (primary) hypertension: Secondary | ICD-10-CM | POA: Diagnosis not present

## 2023-08-06 DIAGNOSIS — M1711 Unilateral primary osteoarthritis, right knee: Secondary | ICD-10-CM | POA: Diagnosis not present

## 2023-08-06 DIAGNOSIS — Z6841 Body Mass Index (BMI) 40.0 and over, adult: Secondary | ICD-10-CM | POA: Diagnosis not present

## 2023-08-06 DIAGNOSIS — M25561 Pain in right knee: Secondary | ICD-10-CM | POA: Diagnosis not present

## 2023-08-08 DIAGNOSIS — M5441 Lumbago with sciatica, right side: Secondary | ICD-10-CM | POA: Diagnosis not present

## 2023-08-08 DIAGNOSIS — Z6841 Body Mass Index (BMI) 40.0 and over, adult: Secondary | ICD-10-CM | POA: Diagnosis not present

## 2023-08-13 DIAGNOSIS — R194 Change in bowel habit: Secondary | ICD-10-CM | POA: Diagnosis not present

## 2023-08-13 DIAGNOSIS — Z6841 Body Mass Index (BMI) 40.0 and over, adult: Secondary | ICD-10-CM | POA: Diagnosis not present

## 2023-08-13 DIAGNOSIS — R109 Unspecified abdominal pain: Secondary | ICD-10-CM | POA: Diagnosis not present

## 2023-09-11 DIAGNOSIS — R109 Unspecified abdominal pain: Secondary | ICD-10-CM | POA: Diagnosis not present

## 2023-10-15 DIAGNOSIS — I10 Essential (primary) hypertension: Secondary | ICD-10-CM | POA: Diagnosis not present

## 2023-10-15 DIAGNOSIS — R109 Unspecified abdominal pain: Secondary | ICD-10-CM | POA: Diagnosis not present

## 2023-11-03 DIAGNOSIS — R918 Other nonspecific abnormal finding of lung field: Secondary | ICD-10-CM | POA: Diagnosis not present

## 2023-11-03 DIAGNOSIS — R1111 Vomiting without nausea: Secondary | ICD-10-CM | POA: Diagnosis not present

## 2023-11-03 DIAGNOSIS — R1110 Vomiting, unspecified: Secondary | ICD-10-CM | POA: Diagnosis not present

## 2023-11-03 DIAGNOSIS — R059 Cough, unspecified: Secondary | ICD-10-CM | POA: Diagnosis not present

## 2023-11-03 DIAGNOSIS — K8020 Calculus of gallbladder without cholecystitis without obstruction: Secondary | ICD-10-CM | POA: Diagnosis not present

## 2023-11-03 DIAGNOSIS — R079 Chest pain, unspecified: Secondary | ICD-10-CM | POA: Diagnosis not present

## 2023-11-03 DIAGNOSIS — R1013 Epigastric pain: Secondary | ICD-10-CM | POA: Diagnosis not present

## 2023-11-03 DIAGNOSIS — R197 Diarrhea, unspecified: Secondary | ICD-10-CM | POA: Diagnosis not present

## 2023-11-03 DIAGNOSIS — R110 Nausea: Secondary | ICD-10-CM | POA: Diagnosis not present

## 2023-11-05 DIAGNOSIS — K8020 Calculus of gallbladder without cholecystitis without obstruction: Secondary | ICD-10-CM | POA: Diagnosis not present

## 2023-11-05 DIAGNOSIS — R059 Cough, unspecified: Secondary | ICD-10-CM | POA: Diagnosis not present

## 2023-11-05 DIAGNOSIS — R1011 Right upper quadrant pain: Secondary | ICD-10-CM | POA: Diagnosis not present

## 2023-11-06 DIAGNOSIS — K91840 Postprocedural hemorrhage and hematoma of a digestive system organ or structure following a digestive system procedure: Secondary | ICD-10-CM | POA: Diagnosis not present

## 2023-11-06 DIAGNOSIS — K8050 Calculus of bile duct without cholangitis or cholecystitis without obstruction: Secondary | ICD-10-CM | POA: Diagnosis not present

## 2023-11-06 DIAGNOSIS — R079 Chest pain, unspecified: Secondary | ICD-10-CM | POA: Diagnosis not present

## 2023-11-06 DIAGNOSIS — Z6841 Body Mass Index (BMI) 40.0 and over, adult: Secondary | ICD-10-CM | POA: Diagnosis not present

## 2023-11-06 DIAGNOSIS — K8000 Calculus of gallbladder with acute cholecystitis without obstruction: Secondary | ICD-10-CM | POA: Diagnosis not present

## 2023-11-06 DIAGNOSIS — E669 Obesity, unspecified: Secondary | ICD-10-CM | POA: Diagnosis not present

## 2023-11-06 DIAGNOSIS — E876 Hypokalemia: Secondary | ICD-10-CM | POA: Diagnosis not present

## 2023-11-06 DIAGNOSIS — K859 Acute pancreatitis without necrosis or infection, unspecified: Secondary | ICD-10-CM | POA: Diagnosis not present

## 2023-11-06 DIAGNOSIS — D72829 Elevated white blood cell count, unspecified: Secondary | ICD-10-CM | POA: Diagnosis not present

## 2023-11-06 DIAGNOSIS — K828 Other specified diseases of gallbladder: Secondary | ICD-10-CM | POA: Diagnosis not present

## 2023-11-06 DIAGNOSIS — I959 Hypotension, unspecified: Secondary | ICD-10-CM | POA: Diagnosis not present

## 2023-11-06 DIAGNOSIS — R58 Hemorrhage, not elsewhere classified: Secondary | ICD-10-CM | POA: Diagnosis not present

## 2023-11-06 DIAGNOSIS — I10 Essential (primary) hypertension: Secondary | ICD-10-CM | POA: Diagnosis not present

## 2023-11-06 DIAGNOSIS — K219 Gastro-esophageal reflux disease without esophagitis: Secondary | ICD-10-CM | POA: Diagnosis not present

## 2023-11-06 DIAGNOSIS — D62 Acute posthemorrhagic anemia: Secondary | ICD-10-CM | POA: Diagnosis not present

## 2023-11-06 DIAGNOSIS — F32A Depression, unspecified: Secondary | ICD-10-CM | POA: Diagnosis not present

## 2023-11-06 DIAGNOSIS — R112 Nausea with vomiting, unspecified: Secondary | ICD-10-CM | POA: Diagnosis not present

## 2023-11-06 DIAGNOSIS — K8020 Calculus of gallbladder without cholecystitis without obstruction: Secondary | ICD-10-CM | POA: Diagnosis not present

## 2023-11-06 DIAGNOSIS — K8042 Calculus of bile duct with acute cholecystitis without obstruction: Secondary | ICD-10-CM | POA: Diagnosis not present

## 2023-11-06 DIAGNOSIS — J45909 Unspecified asthma, uncomplicated: Secondary | ICD-10-CM | POA: Diagnosis not present

## 2023-11-06 DIAGNOSIS — K8063 Calculus of gallbladder and bile duct with acute cholecystitis with obstruction: Secondary | ICD-10-CM | POA: Diagnosis not present

## 2023-11-06 DIAGNOSIS — K8080 Other cholelithiasis without obstruction: Secondary | ICD-10-CM | POA: Diagnosis not present

## 2023-11-06 DIAGNOSIS — K851 Biliary acute pancreatitis without necrosis or infection: Secondary | ICD-10-CM | POA: Diagnosis not present

## 2023-11-06 DIAGNOSIS — R571 Hypovolemic shock: Secondary | ICD-10-CM | POA: Diagnosis not present

## 2023-11-06 DIAGNOSIS — K838 Other specified diseases of biliary tract: Secondary | ICD-10-CM | POA: Diagnosis not present

## 2023-11-06 DIAGNOSIS — K8064 Calculus of gallbladder and bile duct with chronic cholecystitis without obstruction: Secondary | ICD-10-CM | POA: Diagnosis not present

## 2023-11-06 DIAGNOSIS — R7401 Elevation of levels of liver transaminase levels: Secondary | ICD-10-CM | POA: Diagnosis not present

## 2023-11-06 DIAGNOSIS — R748 Abnormal levels of other serum enzymes: Secondary | ICD-10-CM | POA: Diagnosis not present

## 2023-11-13 DIAGNOSIS — R600 Localized edema: Secondary | ICD-10-CM | POA: Diagnosis not present

## 2023-11-13 DIAGNOSIS — R0602 Shortness of breath: Secondary | ICD-10-CM | POA: Diagnosis not present

## 2023-11-13 DIAGNOSIS — R748 Abnormal levels of other serum enzymes: Secondary | ICD-10-CM | POA: Diagnosis not present

## 2023-11-17 DIAGNOSIS — R2243 Localized swelling, mass and lump, lower limb, bilateral: Secondary | ICD-10-CM | POA: Diagnosis not present

## 2023-11-17 DIAGNOSIS — Z09 Encounter for follow-up examination after completed treatment for conditions other than malignant neoplasm: Secondary | ICD-10-CM | POA: Diagnosis not present

## 2023-11-17 DIAGNOSIS — D72829 Elevated white blood cell count, unspecified: Secondary | ICD-10-CM | POA: Diagnosis not present

## 2023-11-17 DIAGNOSIS — E559 Vitamin D deficiency, unspecified: Secondary | ICD-10-CM | POA: Diagnosis not present

## 2023-11-20 DIAGNOSIS — D72829 Elevated white blood cell count, unspecified: Secondary | ICD-10-CM | POA: Diagnosis not present

## 2023-11-20 DIAGNOSIS — E559 Vitamin D deficiency, unspecified: Secondary | ICD-10-CM | POA: Diagnosis not present

## 2023-11-20 DIAGNOSIS — R2243 Localized swelling, mass and lump, lower limb, bilateral: Secondary | ICD-10-CM | POA: Diagnosis not present
